# Patient Record
Sex: Male | Born: 1965 | Hispanic: Yes | Smoking: Former smoker
Health system: Southern US, Community
[De-identification: ages and names within clinical notes are randomized; demographics above are authoritative.]

## PROBLEM LIST (undated history)

## (undated) DIAGNOSIS — G459 Transient cerebral ischemic attack, unspecified: Secondary | ICD-10-CM

## (undated) DIAGNOSIS — N189 Chronic kidney disease, unspecified: Secondary | ICD-10-CM

## (undated) DIAGNOSIS — I10 Essential (primary) hypertension: Secondary | ICD-10-CM

## (undated) HISTORY — DX: Chronic kidney disease, unspecified: N18.9

## (undated) HISTORY — PX: OTHER SURGICAL HISTORY: SHX169

## (undated) HISTORY — PX: KIDNEY TRANSPLANT: SHX239

---

## 2016-08-03 ENCOUNTER — Inpatient Hospital Stay (HOSPITAL_COMMUNITY)
Admission: EM | Admit: 2016-08-03 | Discharge: 2016-08-06 | DRG: 069 | Disposition: A | Discharge status: Home / Self Care | Payer: Self-pay | Attending: Internal Medicine | Admitting: Internal Medicine

## 2016-08-03 ENCOUNTER — Observation Stay (HOSPITAL_COMMUNITY): Payer: Self-pay

## 2016-08-03 ENCOUNTER — Emergency Department (HOSPITAL_COMMUNITY): Payer: Self-pay

## 2016-08-03 ENCOUNTER — Encounter (HOSPITAL_COMMUNITY): Payer: Self-pay | Admitting: Emergency Medicine

## 2016-08-03 DIAGNOSIS — N19 Unspecified kidney failure: Secondary | ICD-10-CM

## 2016-08-03 DIAGNOSIS — L989 Disorder of the skin and subcutaneous tissue, unspecified: Secondary | ICD-10-CM

## 2016-08-03 DIAGNOSIS — R4781 Slurred speech: Secondary | ICD-10-CM | POA: Diagnosis present

## 2016-08-03 DIAGNOSIS — Z91148 Patient's other noncompliance with medication regimen for other reason: Secondary | ICD-10-CM

## 2016-08-03 DIAGNOSIS — N179 Acute kidney failure, unspecified: Secondary | ICD-10-CM | POA: Diagnosis present

## 2016-08-03 DIAGNOSIS — N189 Chronic kidney disease, unspecified: Secondary | ICD-10-CM | POA: Diagnosis present

## 2016-08-03 DIAGNOSIS — N183 Chronic kidney disease, stage 3 (moderate): Secondary | ICD-10-CM

## 2016-08-03 DIAGNOSIS — R000 Tachycardia, unspecified: Secondary | ICD-10-CM | POA: Diagnosis present

## 2016-08-03 DIAGNOSIS — I129 Hypertensive chronic kidney disease with stage 1 through stage 4 chronic kidney disease, or unspecified chronic kidney disease: Secondary | ICD-10-CM | POA: Diagnosis present

## 2016-08-03 DIAGNOSIS — I444 Left anterior fascicular block: Secondary | ICD-10-CM | POA: Diagnosis present

## 2016-08-03 DIAGNOSIS — R7989 Other specified abnormal findings of blood chemistry: Secondary | ICD-10-CM | POA: Insufficient documentation

## 2016-08-03 DIAGNOSIS — I4581 Long QT syndrome: Secondary | ICD-10-CM | POA: Diagnosis present

## 2016-08-03 DIAGNOSIS — I10 Essential (primary) hypertension: Secondary | ICD-10-CM | POA: Diagnosis present

## 2016-08-03 DIAGNOSIS — E876 Hypokalemia: Secondary | ICD-10-CM | POA: Diagnosis present

## 2016-08-03 DIAGNOSIS — Z87891 Personal history of nicotine dependence: Secondary | ICD-10-CM

## 2016-08-03 DIAGNOSIS — R778 Other specified abnormalities of plasma proteins: Secondary | ICD-10-CM

## 2016-08-03 DIAGNOSIS — I248 Other forms of acute ischemic heart disease: Secondary | ICD-10-CM | POA: Diagnosis present

## 2016-08-03 DIAGNOSIS — I169 Hypertensive crisis, unspecified: Secondary | ICD-10-CM | POA: Diagnosis present

## 2016-08-03 DIAGNOSIS — Z8673 Personal history of transient ischemic attack (TIA), and cerebral infarction without residual deficits: Secondary | ICD-10-CM

## 2016-08-03 DIAGNOSIS — Z9114 Patient's other noncompliance with medication regimen: Secondary | ICD-10-CM

## 2016-08-03 DIAGNOSIS — G459 Transient cerebral ischemic attack, unspecified: Principal | ICD-10-CM

## 2016-08-03 HISTORY — DX: Essential (primary) hypertension: I10

## 2016-08-03 HISTORY — DX: Transient cerebral ischemic attack, unspecified: G45.9

## 2016-08-03 LAB — RAPID URINE DRUG SCREEN, HOSP PERFORMED
Amphetamines: NOT DETECTED
BARBITURATES: NOT DETECTED
Benzodiazepines: NOT DETECTED
COCAINE: NOT DETECTED
OPIATES: POSITIVE — AB
TETRAHYDROCANNABINOL: NOT DETECTED

## 2016-08-03 LAB — CBC
HEMATOCRIT: 57.1 % — AB (ref 39.0–52.0)
HEMOGLOBIN: 18.7 g/dL — AB (ref 13.0–17.0)
MCH: 25.3 pg — AB (ref 26.0–34.0)
MCHC: 32.7 g/dL (ref 30.0–36.0)
MCV: 77.2 fL — ABNORMAL LOW (ref 78.0–100.0)
Platelets: 376 10*3/uL (ref 150–400)
RBC: 7.4 MIL/uL — AB (ref 4.22–5.81)
RDW: 15.1 % (ref 11.5–15.5)
WBC: 8.7 10*3/uL (ref 4.0–10.5)

## 2016-08-03 LAB — URINE MICROSCOPIC-ADD ON

## 2016-08-03 LAB — COMPREHENSIVE METABOLIC PANEL
ALBUMIN: 4 g/dL (ref 3.5–5.0)
ALK PHOS: 70 U/L (ref 38–126)
ALT: 23 U/L (ref 17–63)
AST: 20 U/L (ref 15–41)
Anion gap: 10 (ref 5–15)
BILIRUBIN TOTAL: 0.7 mg/dL (ref 0.3–1.2)
BUN: 30 mg/dL — ABNORMAL HIGH (ref 6–20)
CALCIUM: 8.8 mg/dL — AB (ref 8.9–10.3)
CO2: 29 mmol/L (ref 22–32)
CREATININE: 2.75 mg/dL — AB (ref 0.61–1.24)
Chloride: 100 mmol/L — ABNORMAL LOW (ref 101–111)
GFR calc Af Amer: 29 mL/min — ABNORMAL LOW (ref >60)
GFR calc non Af Amer: 25 mL/min — ABNORMAL LOW (ref >60)
GLUCOSE: 149 mg/dL — AB (ref 65–99)
Potassium: 3.3 mmol/L — ABNORMAL LOW (ref 3.5–5.1)
SODIUM: 139 mmol/L (ref 135–145)
TOTAL PROTEIN: 8.3 g/dL — AB (ref 6.5–8.1)

## 2016-08-03 LAB — URINALYSIS, ROUTINE W REFLEX MICROSCOPIC
Bilirubin Urine: NEGATIVE
GLUCOSE, UA: 100 mg/dL — AB
Ketones, ur: NEGATIVE mg/dL
Leukocytes, UA: NEGATIVE
Nitrite: NEGATIVE
PH: 7 (ref 5.0–8.0)
SPECIFIC GRAVITY, URINE: 1.016 (ref 1.005–1.030)

## 2016-08-03 LAB — PROTIME-INR
INR: 1
Prothrombin Time: 13.2 seconds (ref 11.4–15.2)

## 2016-08-03 LAB — TROPONIN I
TROPONIN I: 0.04 ng/mL — AB (ref <0.03)
Troponin I: 0.04 ng/mL (ref <0.03)

## 2016-08-03 LAB — TSH: TSH: 2.13 u[IU]/mL (ref 0.350–4.500)

## 2016-08-03 MED ORDER — HYDRALAZINE HCL 20 MG/ML IJ SOLN
10.0000 mg | INTRAMUSCULAR | Status: DC | PRN
  Administered 2016-08-03 – 2016-08-04 (×2): 10 mg via INTRAVENOUS
  Filled 2016-08-03 (×2): qty 1

## 2016-08-03 MED ORDER — ONDANSETRON HCL 4 MG/2ML IJ SOLN
4.0000 mg | Freq: Once | INTRAMUSCULAR | Status: AC
  Administered 2016-08-03: 4 mg via INTRAVENOUS
  Filled 2016-08-03: qty 2

## 2016-08-03 MED ORDER — LABETALOL HCL 5 MG/ML IV SOLN
20.0000 mg | Freq: Once | INTRAVENOUS | Status: AC
  Administered 2016-08-03: 20 mg via INTRAVENOUS
  Filled 2016-08-03: qty 4

## 2016-08-03 MED ORDER — STROKE: EARLY STAGES OF RECOVERY BOOK
Freq: Once | Status: AC
  Administered 2016-08-03: 16:00:00
  Filled 2016-08-03: qty 1

## 2016-08-03 MED ORDER — PNEUMOCOCCAL VAC POLYVALENT 25 MCG/0.5ML IJ INJ
0.5000 mL | INJECTION | INTRAMUSCULAR | Status: AC
  Administered 2016-08-04: 0.5 mL via INTRAMUSCULAR
  Filled 2016-08-03 (×2): qty 0.5

## 2016-08-03 MED ORDER — AMLODIPINE BESYLATE 10 MG PO TABS
10.0000 mg | ORAL_TABLET | Freq: Every day | ORAL | Status: DC
  Administered 2016-08-03 – 2016-08-06 (×4): 10 mg via ORAL
  Filled 2016-08-03 (×4): qty 1

## 2016-08-03 MED ORDER — MORPHINE SULFATE (PF) 4 MG/ML IV SOLN
4.0000 mg | Freq: Once | INTRAVENOUS | Status: AC
  Administered 2016-08-03: 4 mg via INTRAVENOUS
  Filled 2016-08-03: qty 1

## 2016-08-03 MED ORDER — ASPIRIN 300 MG RE SUPP
300.0000 mg | Freq: Every day | RECTAL | Status: DC
  Filled 2016-08-03 (×3): qty 1

## 2016-08-03 MED ORDER — ASPIRIN 325 MG PO TABS
325.0000 mg | ORAL_TABLET | Freq: Every day | ORAL | Status: DC
  Administered 2016-08-04 – 2016-08-06 (×3): 325 mg via ORAL
  Filled 2016-08-03 (×3): qty 1

## 2016-08-03 MED ORDER — HYDRALAZINE HCL 25 MG PO TABS
25.0000 mg | ORAL_TABLET | Freq: Three times a day (TID) | ORAL | Status: DC
  Administered 2016-08-03 – 2016-08-04 (×3): 25 mg via ORAL
  Filled 2016-08-03 (×3): qty 1

## 2016-08-03 MED ORDER — ASPIRIN 81 MG PO CHEW
325.0000 mg | CHEWABLE_TABLET | Freq: Once | ORAL | Status: AC
  Administered 2016-08-03: 325 mg via ORAL
  Filled 2016-08-03: qty 5

## 2016-08-03 MED ORDER — HYDRALAZINE HCL 20 MG/ML IJ SOLN
20.0000 mg | Freq: Once | INTRAMUSCULAR | Status: AC
  Administered 2016-08-03: 20 mg via INTRAVENOUS
  Filled 2016-08-03: qty 1

## 2016-08-03 MED ORDER — SODIUM CHLORIDE 0.9 % IV SOLN
INTRAVENOUS | Status: DC
  Administered 2016-08-03 – 2016-08-04 (×2): via INTRAVENOUS

## 2016-08-03 MED ORDER — SODIUM CHLORIDE 0.9 % IV BOLUS (SEPSIS)
1000.0000 mL | Freq: Once | INTRAVENOUS | Status: AC
  Administered 2016-08-03: 1000 mL via INTRAVENOUS

## 2016-08-03 MED ORDER — ENOXAPARIN SODIUM 40 MG/0.4ML ~~LOC~~ SOLN
40.0000 mg | SUBCUTANEOUS | Status: DC
  Administered 2016-08-03 – 2016-08-05 (×3): 40 mg via SUBCUTANEOUS
  Filled 2016-08-03 (×3): qty 0.4

## 2016-08-03 NOTE — ED Notes (Signed)
MD at bedside. ADMITTING MD PRESENT 

## 2016-08-03 NOTE — H&P (Signed)
History and Physical    Fred Harrison TDD:[id number] DOB: [birth year] DOA: 08/03/2016  PCP: No PCP Per Patient  Outpatient Specialists: none Patient coming from: home  Chief Complaint: left-sided headache and right arm numbness  HPI: Fred Harrison is a 50 y.o. male with medical history significant of HTN (currently not on medication), ?CKD(used to see nephrologist in [place]) and CVA (about 7 years ago).   Patient reports intermittent headache for a week and half that he has been managing with as needed Tylenol. He got off work this morning about 5am and slept. About 7:30 AM, he woke up with left sided headache and right sided numbness and brought to ED by his son-in-law. He also reports having pain in right eye yesterday that has resolved. Patient denies facial drooping, change in speech, vision changes, arm or leg weakness, chest pain or shortness of breath. He initially stated that numbness has resolved but when tried to retrieve his daughter's phone number, he flet his hand is numb. Daughter stated that his speech was slightly slurred when he woke up this morning. She also stated that he has history of stroke in the past.  He has history of hypertension and hasn't taken any medications since he moved from [place] to [place] about a year and half ago. He reports following up with nephrologist when he was in [place] for his kidney. However, he doesn't know what the exact problem was. He denies history of diabetes or heart problem.   Quit smoking 07/01/2016. Has history of 2-pack-year smoking although his daughter stated that he was smoking up to 2 packs a day in the past. Denies EtOH or recreational drug use.   Lives with daughter and son-in-law since he moved down from [place] to [place]. Daughter's phone number is [phone]. Daughter's name Fred Harrison.   Family history significant for death from heart attack in his father at age of 36 years. Days from CVA at 70 years of  age in his mother. He has one brother and three sisters with no cardiovascular heart condition to his knowledge.   ED Course: Vital signs significant for blood pressure 251/159 and heart rate 107 on arrival to ED. CMP significant for hypokalemia to 3.3, creatinine to 2.75 otherwise within normal limits. CBC significant for hemoglobin of 18.7 and hematocrit of 57.1. Troponin 0.04. EKG with sinus tachycardia, biatrial enlargement, left axis deviation and left anterior fascicular block and QT prolongation. CT head without acute intracranial process. X-ray likely with basilar atelectasis/ scarring with no evidence of lobar pneumonia. Received aspirin 325 mg, labetalol 20 mg IV, hydralazine 20 mg IV, morphine 4 mg, Zofran 4 mg and normal saline 1 L bolus. Hospitalist service was called to admit patient for further workup and observation. Neurology was not called while in ED because patient reported that his symptoms have resolved. However, patient states having some numbness in right arm when he tried to retrieve his daughter's number from his phone.   Review of Systems: As per HPI otherwise 10 point review of systems negative.   Past Medical History:  Diagnosis Date  . Hypertension   . TIA (transient ischemic attack)     History reviewed. No pertinent surgical history.   reports that he has quit smoking. He has never used smokeless tobacco. He reports that he does not drink alcohol. His drug history is not on file.  No Known Allergies  No family history on file.  Prior to Admission medications   Not on  File    Physical Exam: Vitals:   08/03/16 0945 08/03/16 1015 08/03/16 1100 08/03/16 1140  BP: (!) 161/111 (!) 163/112 (!) 175/116 (!) 177/120  Pulse: 91 96 101 90  Resp: 13 16 19 15   Temp:    97.8 F (36.6 C)  TempSrc:    Oral  SpO2: 97% 96% 95% 96%  Weight:      Height:          Constitutional: NAD, calm, comfortable Vitals:   08/03/16 0945 08/03/16 1015 08/03/16 1100 08/03/16  1140  BP: (!) 161/111 (!) 163/112 (!) 175/116 (!) 177/120  Pulse: 91 96 101 90  Resp: 13 16 19 15   Temp:    97.8 F (36.6 C)  TempSrc:    Oral  SpO2: 97% 96% 95% 96%  Weight:      Height:       Eyes: Pupils equal round, mildly reactive to light bilaterally, lids and conjunctivae normal ENMT: Mucous membranes are moist. Posterior pharynx clear of any exudate or lesions. Neck: normal, supple, no masses, no thyromegaly Respiratory: clear to auscultation bilaterally, no wheezing, no crackles. Normal respiratory effort. No accessory muscle use.  Cardiovascular: Regular rate and rhythm, no murmurs / rubs / gallops. No extremity edema. 2+ pedal pulses. No carotid bruits Abdomen: no tenderness, no masses palpated. Bowel sounds positive.  Musculoskeletal: no clubbing / cyanosis. Normal muscle tone.  Skin: no rashes, lesions, ulcers. Has cystic mass about 1 inch in diameter on top of his frontal head.  Neurologic: Speech is mildly slurred, CN 2-12 intact. Strength 5/5 in all muscle groups of upper and lower extremities, light sensation intact in all dermatomes, biceps and patellar reflex 2+ bilaterally, finger-to-nose slow in right compared to left. No pronator drift.  Psychiatric: Normal judgment and insight. Alert and oriented x 3. Normal mood.   Labs on Admission: I have personally reviewed following labs and imaging studies  CBC:  Recent Labs Lab 08/03/16 0840  WBC 8.7  HGB 18.7*  HCT 57.1*  MCV 77.2*  PLT 465   Basic Metabolic Panel:  Recent Labs Lab 08/03/16 0840  NA 139  K 3.3*  CL 100*  CO2 29  GLUCOSE 149*  BUN 30*  CREATININE 2.75*  CALCIUM 8.8*   GFR: Estimated Creatinine Clearance: 33.7 mL/min (by C-G formula based on SCr of 2.75 mg/dL). Liver Function Tests:  Recent Labs Lab 08/03/16 0840  AST 20  ALT 23  ALKPHOS 70  BILITOT 0.7  PROT 8.3*  ALBUMIN 4.0   No results for input(s): LIPASE, AMYLASE in the last 168 hours. No results for input(s): AMMONIA  in the last 168 hours. Coagulation Profile:  Recent Labs Lab 08/03/16 0840  INR 1.00   Cardiac Enzymes:  Recent Labs Lab 08/03/16 0840  TROPONINI 0.04*   BNP (last 3 results) No results for input(s): PROBNP in the last 8760 hours. HbA1C: No results for input(s): HGBA1C in the last 72 hours. CBG: No results for input(s): GLUCAP in the last 168 hours. Lipid Profile: No results for input(s): CHOL, HDL, LDLCALC, TRIG, CHOLHDL, LDLDIRECT in the last 72 hours. Thyroid Function Tests: No results for input(s): TSH, T4TOTAL, FREET4, T3FREE, THYROIDAB in the last 72 hours. Anemia Panel: No results for input(s): VITAMINB12, FOLATE, FERRITIN, TIBC, IRON, RETICCTPCT in the last 72 hours. Urine analysis: No results found for: COLORURINE, APPEARANCEUR, LABSPEC, PHURINE, GLUCOSEU, HGBUR, BILIRUBINUR, KETONESUR, PROTEINUR, UROBILINOGEN, NITRITE, LEUKOCYTESUR Sepsis Labs: @LABRCNTIP (procalcitonin:4,lacticidven:4) )No results found for this or any previous visit (from the past 240  hour(s)).   Radiological Exams on Admission: Dg Chest 2 View  Result Date: 08/03/2016 CLINICAL DATA:  50 year old male with a history of right arm numbness EXAM: CHEST  2 VIEW COMPARISON:  None. FINDINGS: Cardiomediastinal silhouette within normal limits. No evidence of central vascular congestion. No pneumothorax or pleural effusion. Interstitial opacities at the lung bases. No confluent airspace disease. No displaced fracture. IMPRESSION: Likely basilar atelectasis/ scarring with no evidence of lobar pneumonia. Signed, Dulcy Fanny. Earleen Newport, DO [facility] Radiology Electronically Signed   By: Corrie Mckusick D.O.   On: 08/03/2016 09:28   Ct Head Wo Contrast  Result Date: 08/03/2016 CLINICAL DATA:  50 year old male with a history of right arm numbness EXAM: CT HEAD WITHOUT CONTRAST TECHNIQUE: Contiguous axial images were obtained from the base of the skull through the vertex  without intravenous contrast. COMPARISON:  None. FINDINGS: Brain: No acute intracranial hemorrhage. No midline shift or mass effect. Gray-white differentiation maintained. Unremarkable appearance of the ventricular system. Confluent hypodensity in the periventricular white matter, within frontal and parietal regions. Vascular: Minimal calcifications of the anterior vasculature. Skull: No acute fracture.  No aggressive bone lesion identified. Sinuses/Orbits: Unremarkable appearance of the orbits. Mastoid air cells clear. No middle ear effusion. No significant sinus disease. Other: Mixed density benign-appearing rounded soft tissue lesions within the right height temporal region measuring 1.5 cm and in the high left frontal region at the vertex measuring 3.3 cm. No involvement of the underlying calvarium. IMPRESSION: No CT evidence of acute intracranial abnormality. Evidence of microvascular ischemic disease. Mixed density dermal lesions in the right temporal soft tissues and left high frontal region at the vertex. These lesion should be amenable to direct inspection. Signed, Dulcy Fanny. Earleen Newport, DO [facility] Radiology Electronically Signed   By: Corrie Mckusick D.O.   On: 08/03/2016 09:21    EKG: Independently reviewed.   Assessment/Plan Active Problems:   TIA (transient ischemic attack)  Left-sided headache/right arm numbness: CT head negative for intracranial bleeding. Patient was in hypertensive crisis. Neuro exam is within normal limits except for his slow finger-to-nose in the right arm compared to left. Speech is also slightly slurred. Initially patient reported to ED provider that his symptoms have resolved. However, when he tried to retrieve his daughters phone number from his cell, he felt he still have some numbness in his right arm.  -Admit to telemetry -TIA/Stroke work up: -MRI brian -Echo  -Carotid  doppler -Risk stratification labs (lipid panel, A1c and TSH) -UDS -We will consult neurology if there is significant MRI finding -Neurovascular check q2hrs -Cardiac monitor -ASA. Received once in ED -PT/OT/SLP -Atorvastatin 80 mg daily once he passed bedside swallow test -NPO pending SLP eval -Normal saline at 100 mL per hour -Fall precaution -Permissive hypertension  Accelerated hypertension: BP in 250s/130s on arrival. Status post hydralazine IV 20 mg and labetalol IV 20 mg in ED. BP 177/120. -Permissive hypertension -labetalol IV 20 mg for systolic blood pressure over 595 and diastolic blood pressure over 110 -We will decide about long term blood pressure control after TIA/stroke workup  Elevated troponin: this likely due to his hypertension. He this could also be from his CKD. No chest pain or shortness of breath  -Repeat trop  AoCKD: Cr 2.75 on admission. Baseline unknown.  Used to see nephrologist in [place] before he moved to [facility]. S/p 1L of NS -NS at 100cc/hr -repeat BMP in the morning.    DVT prophylaxis: Lovenox Code Status:  Partial. No intubation Family Communication:  Daughter Antone Summons). [phone]. Disposition Plan: Likely home Consults called: No Admission status: Telemetry   Wendee Beavers, MD FM resident Pager [phone]  If 7PM-7AM, please contact night-coverage [email or web address] Password [email or web address]  08/03/2016, 11:46 AM

## 2016-08-03 NOTE — ED Notes (Signed)
EKKG PERFORMED. EDP AT BEDSIDE

## 2016-08-03 NOTE — ED Provider Notes (Signed)
[facility] DEPT Provider Note   CSN: [id number] Arrival date & time: 08/03/16  6503     History   Chief Complaint Chief Complaint  Patient presents with  . right arm numbness    HPI Fred Harrison is a 50 y.o. male.  Pt has been having intermittent headaches for the past few months.  Pt worked nights last night and got off around 0500.  He had a little headache last night, but it was not too bad.  He went to sleep and felt ok.  He said that he woke up around 0720 with a severe left sided h/a and right arm numbness.  The pt does have a hx of htn, but has not taken his meds in over a year.  He has also had a TIA in the past, but does not take daily ASA.  The pt said the numbness has improved, but he still has the headache.  Pt also notes that he has a growth to the top of his head that has been worsening over the past year.  He has never seen a doctor for this problem.      Past Medical History:  Diagnosis Date  . Hypertension   . TIA (transient ischemic attack)     Patient Active Problem List   Diagnosis Date Noted  . TIA (transient ischemic attack) 08/03/2016    History reviewed. No pertinent surgical history.     Home Medications    Prior to Admission medications   Not on File    Family History No family history on file.  Social History Social History  Substance Use Topics  . Smoking status: Former Research scientist (life sciences)  . Smokeless tobacco: Never Used  . Alcohol use No     Allergies   Review of patient's allergies indicates no known allergies.   Review of Systems Review of Systems  HENT:       Head lesion  Neurological: Positive for numbness and headaches.  All other systems reviewed and are negative.    Physical Exam Updated Vital Signs BP (!) 189/134   Pulse 89   Temp 98 F (36.7 C) (Oral)   Resp 15   Ht 5\' 7"  (1.702 m)   Wt 200 lb (90.7 kg)   SpO2 95%   BMI 31.32 kg/m   Physical Exam  Constitutional: He is oriented to person, place, and  time. He appears well-developed and well-nourished.  HENT:  Head: Atraumatic.    Right Ear: External ear normal.  Left Ear: External ear normal.  Nose: Nose normal.  Mouth/Throat: Oropharynx is clear and moist.  Eyes: EOM are normal. Pupils are equal, round, and reactive to light.  Neck: Normal range of motion. Neck supple.  Cardiovascular: Normal rate, regular rhythm, normal heart sounds and intact distal pulses.   Pulmonary/Chest: Effort normal and breath sounds normal.  Abdominal: Soft. Bowel sounds are normal.  Musculoskeletal: Normal range of motion.  Neurological: He is alert and oriented to person, place, and time.  Skin: Skin is warm and dry.  Psychiatric: He has a normal mood and affect. His behavior is normal. Judgment and thought content normal.  Nursing note and vitals reviewed.    ED Treatments / Results  Labs (all labs ordered are listed, but only abnormal results are displayed) Labs Reviewed  CBC - Abnormal; Notable for the following:       Result Value   RBC 7.40 (*)    Hemoglobin 18.7 (*)    HCT 57.1 (*)  MCV 77.2 (*)    MCH 25.3 (*)    All other components within normal limits  COMPREHENSIVE METABOLIC PANEL - Abnormal; Notable for the following:    Potassium 3.3 (*)    Chloride 100 (*)    Glucose, Bld 149 (*)    BUN 30 (*)    Creatinine, Ser 2.75 (*)    Calcium 8.8 (*)    Total Protein 8.3 (*)    GFR calc non Af Amer 25 (*)    GFR calc Af Amer 29 (*)    All other components within normal limits  TROPONIN I - Abnormal; Notable for the following:    Troponin I 0.04 (*)    All other components within normal limits  URINE RAPID DRUG SCREEN, HOSP PERFORMED  PROTIME-INR  URINALYSIS, ROUTINE W REFLEX MICROSCOPIC (NOT AT [facility])    EKG  EKG Interpretation  Date/Time:  Sunday August 03 2016 08:28:19 EDT Ventricular Rate:  105 PR Interval:    QRS Duration: 106 QT Interval:  384 QTC Calculation: 508 R Axis:   -55 Text Interpretation:  Sinus  tachycardia Biatrial enlargement Left anterior fascicular block Abnormal R-wave progression, early transition Left ventricular hypertrophy ST elevation, consider inferior injury Prolonged QT interval Baseline wander in lead(s) III aVL aVF V1 V3 Confirmed by Mattheo Swindle MD, Enes Wegener ([id number]) on 08/03/2016 8:43:10 AM Also confirmed by Gilford Raid MD, Kamar Callender [phone]), editor Stout CT, Leda Gauze [phone])  on 08/03/2016 9:14:15 AM       Radiology Dg Chest 2 View  Result Date: 08/03/2016 CLINICAL DATA:  50 year old male with a history of right arm numbness EXAM: CHEST  2 VIEW COMPARISON:  None. FINDINGS: Cardiomediastinal silhouette within normal limits. No evidence of central vascular congestion. No pneumothorax or pleural effusion. Interstitial opacities at the lung bases. No confluent airspace disease. No displaced fracture. IMPRESSION: Likely basilar atelectasis/ scarring with no evidence of lobar pneumonia. Signed, Dulcy Fanny. Earleen Newport, DO [facility] Radiology Electronically Signed   By: Corrie Mckusick D.O.   On: 08/03/2016 09:28   Ct Head Wo Contrast  Result Date: 08/03/2016 CLINICAL DATA:  50 year old male with a history of right arm numbness EXAM: CT HEAD WITHOUT CONTRAST TECHNIQUE: Contiguous axial images were obtained from the base of the skull through the vertex without intravenous contrast. COMPARISON:  None. FINDINGS: Brain: No acute intracranial hemorrhage. No midline shift or mass effect. Gray-white differentiation maintained. Unremarkable appearance of the ventricular system. Confluent hypodensity in the periventricular white matter, within frontal and parietal regions. Vascular: Minimal calcifications of the anterior vasculature. Skull: No acute fracture.  No aggressive bone lesion identified. Sinuses/Orbits: Unremarkable appearance of the orbits. Mastoid air cells clear. No middle ear effusion. No significant sinus disease. Other: Mixed density benign-appearing  rounded soft tissue lesions within the right height temporal region measuring 1.5 cm and in the high left frontal region at the vertex measuring 3.3 cm. No involvement of the underlying calvarium. IMPRESSION: No CT evidence of acute intracranial abnormality. Evidence of microvascular ischemic disease. Mixed density dermal lesions in the right temporal soft tissues and left high frontal region at the vertex. These lesion should be amenable to direct inspection. Signed, Dulcy Fanny. Earleen Newport, DO [facility] Radiology Electronically Signed   By: Corrie Mckusick D.O.   On: 08/03/2016 09:21    Procedures Procedures (including critical care time)  Medications Ordered in ED Medications  aspirin chewable tablet 325 mg (not administered)  sodium chloride 0.9 % bolus 1,000 mL (not  administered)  labetalol (NORMODYNE,TRANDATE) injection 20 mg (20 mg Intravenous Given 08/03/16 0900)  morphine 4 MG/ML injection 4 mg (4 mg Intravenous Given 08/03/16 0843)  ondansetron (ZOFRAN) injection 4 mg (4 mg Intravenous Given 08/03/16 0840)  hydrALAZINE (APRESOLINE) injection 20 mg (20 mg Intravenous Given 08/03/16 0934)     Initial Impression / Assessment and Plan / ED Course  I have reviewed the triage vital signs and the nursing notes.  Pertinent labs & imaging results that were available during my care of the patient were reviewed by me and considered in my medical decision making (see chart for details).  Clinical Course    Pt's bp remains high after 20 mg of labetalol and 20 mg of hydralazine.  He also has aki with elevated troponin.  The troponin is likely due to the renal insufficiency, but will need trending.  The pt was d/w Dr. Cruzita Lederer (triad) who will admit.  Final Clinical Impressions(s) / ED Diagnoses   Final diagnoses:  Transient cerebral ischemia, unspecified transient cerebral ischemia type  Malignant hypertension  AKI (acute kidney injury) ([place])  Elevated  troponin  Skin lesion of scalp    New Prescriptions New Prescriptions   No medications on file     Isla Pence, MD 08/03/16 1031

## 2016-08-03 NOTE — ED Notes (Signed)
BRENT /MRI IN ROUTE- ETA 1 HOUR

## 2016-08-03 NOTE — ED Notes (Signed)
Attempted blood draw x 2 for recollect of blue tube without success

## 2016-08-03 NOTE — ED Notes (Signed)
Charge Manuela Schwartz RN made aware of delay in transfer. [facility] xray called [facility] [phone] MRI - aware of order- MRI order placed for tomorrow - NOT TODAY.Marland Kitchen [facility] F[facility] will call admitting MD to change order.

## 2016-08-03 NOTE — ED Notes (Signed)
DELAY ON LABATETOL. TRANSFER TO CT

## 2016-08-03 NOTE — ED Notes (Signed)
MD at bedside. 

## 2016-08-03 NOTE — ED Triage Notes (Signed)
Patient c/o right arm numbness that started about 30 mins ago when woke up from sleeping.  Patient has left side headache this morning which woke him up due to pain.  Patient states that about 3 years ago had TIA. Patient also states that knot on top of his head has gotten bigger in the last year.

## 2016-08-03 NOTE — ED Notes (Signed)
Pt still unable to give urine sample

## 2016-08-03 NOTE — ED Notes (Addendum)
MD at bedside. Explaining results of scan and pt current status

## 2016-08-04 ENCOUNTER — Observation Stay (HOSPITAL_BASED_OUTPATIENT_CLINIC_OR_DEPARTMENT_OTHER): Payer: Self-pay

## 2016-08-04 DIAGNOSIS — I10 Essential (primary) hypertension: Secondary | ICD-10-CM

## 2016-08-04 DIAGNOSIS — R200 Anesthesia of skin: Secondary | ICD-10-CM

## 2016-08-04 DIAGNOSIS — R7989 Other specified abnormal findings of blood chemistry: Secondary | ICD-10-CM

## 2016-08-04 DIAGNOSIS — N179 Acute kidney failure, unspecified: Secondary | ICD-10-CM

## 2016-08-04 DIAGNOSIS — G459 Transient cerebral ischemic attack, unspecified: Secondary | ICD-10-CM

## 2016-08-04 DIAGNOSIS — I6523 Occlusion and stenosis of bilateral carotid arteries: Secondary | ICD-10-CM

## 2016-08-04 LAB — VAS US CAROTID
LCCADSYS: -78 cm/s
LCCAPDIAS: 18 cm/s
LEFT ECA DIAS: -10 cm/s
LEFT VERTEBRAL DIAS: 16 cm/s
LICADDIAS: -17 cm/s
LICAPDIAS: -20 cm/s
LICAPSYS: -58 cm/s
Left CCA dist dias: -17 cm/s
Left CCA prox sys: 93 cm/s
Left ICA dist sys: -43 cm/s
RCCAPDIAS: 14 cm/s
RCCAPSYS: 64 cm/s
RIGHT ECA DIAS: -11 cm/s
RIGHT VERTEBRAL DIAS: 8 cm/s
Right cca dist sys: -46 cm/s

## 2016-08-04 LAB — BASIC METABOLIC PANEL
ANION GAP: 11 (ref 5–15)
ANION GAP: 10 (ref 5–15)
BUN: 24 mg/dL — ABNORMAL HIGH (ref 6–20)
BUN: 23 mg/dL — ABNORMAL HIGH (ref 6–20)
CHLORIDE: 100 mmol/L — AB (ref 101–111)
CHLORIDE: 100 mmol/L — AB (ref 101–111)
CO2: 27 mmol/L (ref 22–32)
CO2: 28 mmol/L (ref 22–32)
CREATININE: 2.47 mg/dL — AB (ref 0.61–1.24)
Calcium: 8.6 mg/dL — ABNORMAL LOW (ref 8.9–10.3)
Calcium: 8.5 mg/dL — ABNORMAL LOW (ref 8.9–10.3)
Creatinine, Ser: 2.48 mg/dL — ABNORMAL HIGH (ref 0.61–1.24)
GFR calc non Af Amer: 28 mL/min — ABNORMAL LOW (ref >60)
GFR calc non Af Amer: 28 mL/min — ABNORMAL LOW (ref >60)
GFR, EST AFRICAN AMERICAN: 33 mL/min — AB (ref >60)
GFR, EST AFRICAN AMERICAN: 33 mL/min — AB (ref >60)
Glucose, Bld: 133 mg/dL — ABNORMAL HIGH (ref 65–99)
Glucose, Bld: 123 mg/dL — ABNORMAL HIGH (ref 65–99)
POTASSIUM: 3 mmol/L — AB (ref 3.5–5.1)
POTASSIUM: 3.5 mmol/L (ref 3.5–5.1)
SODIUM: 138 mmol/L (ref 135–145)
SODIUM: 138 mmol/L (ref 135–145)

## 2016-08-04 LAB — LIPID PANEL
CHOL/HDL RATIO: 4.6 ratio
CHOLESTEROL: 161 mg/dL (ref 0–200)
HDL: 35 mg/dL — ABNORMAL LOW (ref >40)
LDL Cholesterol: 92 mg/dL (ref 0–99)
Triglycerides: 172 mg/dL — ABNORMAL HIGH (ref <150)
VLDL: 34 mg/dL (ref 0–40)

## 2016-08-04 LAB — ECHOCARDIOGRAM COMPLETE
HEIGHTINCHES: 67 in
Weight: 3200 oz

## 2016-08-04 MED ORDER — ACETAMINOPHEN 325 MG PO TABS
650.0000 mg | ORAL_TABLET | Freq: Four times a day (QID) | ORAL | Status: DC | PRN
  Administered 2016-08-04 – 2016-08-05 (×2): 650 mg via ORAL
  Filled 2016-08-04 (×2): qty 2

## 2016-08-04 MED ORDER — METOPROLOL TARTRATE 50 MG PO TABS
50.0000 mg | ORAL_TABLET | Freq: Two times a day (BID) | ORAL | Status: DC
  Administered 2016-08-04 – 2016-08-05 (×3): 50 mg via ORAL
  Filled 2016-08-04 (×3): qty 1

## 2016-08-04 NOTE — Evaluation (Signed)
Physical Therapy One Time Evaluation Patient Details Name: Fred Harrison MRN: [id number] DOB: 03-26-1966 Today's Date: 08/04/2016   History of Present Illness  50 -year-old male with past medical history significant for hypertension, chronic kidney disease, TIA about 7 years ago and admitted for TIA, headache, and right sided numbness.  MRI negative for acute findings  Clinical Impression  Patient evaluated by Physical Therapy with no further acute PT needs identified. All education has been completed and the patient has no further questions.  Pt mobilizing well and denies any symptoms at time of evaluation. See below for any follow-up Physical Therapy or equipment needs. PT is signing off. Thank you for this referral.     Follow Up Recommendations No PT follow up    Equipment Recommendations  None recommended by PT    Recommendations for Other Services       Precautions / Restrictions Precautions Precautions: None      Mobility  Bed Mobility               General bed mobility comments: pt up in recliner on arrival  Transfers Overall transfer level: Needs assistance   Transfers: Sit to/from Stand Sit to Stand: Supervision         General transfer comment: supervision only due to BP  Ambulation/Gait Ambulation/Gait assistance: Supervision Ambulation Distance (Feet): 400 Feet Assistive device: None Gait Pattern/deviations: WFL(Within Functional Limits)     General Gait Details: no unsteadiness, denies symptoms  Stairs            Wheelchair Mobility    Modified Rankin (Stroke Patients Only) Modified Rankin (Stroke Patients Only) Pre-Morbid Rankin Score: No symptoms Modified Rankin: No symptoms     Balance Overall balance assessment: No apparent balance deficits (not formally assessed) (denies feeling unsteady)                                           Pertinent Vitals/Pain Pain Assessment: No/denies pain (headache this  morning but none currently)  Pre-activity BP: 150/104 mmHg, 75 bpm (RN states this is lower BP for him, has been running very high)    Home Living Family/patient expects to be discharged to:: Private residence Living Arrangements: Children Available Help at Discharge: Family         Home Layout: Two level Home Equipment: None      Prior Function Level of Independence: Independent         Comments: works in [employer] Hand: Right    Extremity/Trunk Assessment   Upper Extremity Assessment: Overall WFL for tasks assessed           Lower Extremity Assessment: Overall WFL for tasks assessed (pt denies any weakness, no symptoms)         Communication   Communication: No difficulties  Cognition Arousal/Alertness: Awake/alert Behavior During Therapy: WFL for tasks assessed/performed Overall Cognitive Status: Within Functional Limits for tasks assessed                      General Comments      Exercises        Assessment/Plan    PT Assessment Patent does not need any further PT services  PT Diagnosis Difficulty walking   PT Problem List    PT Treatment Interventions     PT Goals (Current goals can be  found in the Care Plan section) Acute Rehab PT Goals Patient Stated Goal: get back to work PT Goal Formulation: All assessment and education complete, DC therapy    Frequency     Barriers to discharge        Co-evaluation               End of Session Equipment Utilized During Treatment: Gait belt Activity Tolerance: Patient tolerated treatment well Patient left: in chair;with call bell/phone within reach Nurse Communication: Mobility status    Functional Assessment Tool Used: clinical judgement Functional Limitation: Mobility: Walking and moving around Mobility: Walking and Moving Around Current Status (Y1856): At least 1 percent but less than 20 percent impaired, limited or restricted Mobility:  Walking and Moving Around Goal Status [phone]): 0 percent impaired, limited or restricted Mobility: Walking and Moving Around Discharge Status [phone]): 0 percent impaired, limited or restricted    Time: 1200-1209 PT Time Calculation (min) (ACUTE ONLY): 9 min   Charges:   PT Evaluation $PT Eval Low Complexity: 1 Procedure     PT G Codes:   PT G-Codes **NOT FOR INPATIENT CLASS** Functional Assessment Tool Used: clinical judgement Functional Limitation: Mobility: Walking and moving around Mobility: Walking and Moving Around Current Status (C5885): At least 1 percent but less than 20 percent impaired, limited or restricted Mobility: Walking and Moving Around Goal Status [phone]): 0 percent impaired, limited or restricted Mobility: Walking and Moving Around Discharge Status [phone]): 0 percent impaired, limited or restricted    Lamesha Tibbits,KATHrine E 08/04/2016, 1:21 PM Carmelia Bake, PT, DPT 08/04/2016 Pager: [phone]

## 2016-08-04 NOTE — Progress Notes (Signed)
  Echocardiogram 2D Echocardiogram has been performed.  Bobbye Charleston 08/04/2016, 9:01 AM

## 2016-08-04 NOTE — Progress Notes (Addendum)
Patient ID: Fred Harrison, male   DOB: 03/12/1966, 50 y.o.   MRN: [id number]  PROGRESS NOTE    Fred Harrison  FUX:[id number] DOB: [birth year] DOA: 08/03/2016  PCP: No PCP Per Patient   Brief Narrative:  50 -year-old male with past medical history significant for hypertension, chronic kidney disease, TIA about 7 years ago. Patient is from [place] area and has moved to this area and has not been compliant with medications. He presented with headaches and numbness over the right side of the body. Patient did not have reports of weakness or facial droop or slurred speech or vision changes. 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 His creatinine on the admission was 2.7 and he does say he has had chronic kidney disease but not sure of his baseline creatinine. The troponin level was 0.04 on the admission and 12-lead EKG showed sinus tachycardia. Of note neurology was not consulted as patient reported improvement in his symptoms while in ED.  Assessment & Plan:   TIA / Headache / Right sided numbness  - TIA order set utilized - CT head and MRI without acute intracranial findings - Carotid Doppler did not show significant stenosis bilaterally - 2-D echo showed ejection fraction of 50% with grade 2 diastolic dysfunction - Patient has slight headache this morning but numbness has completely resolved - Continue daily aspirin  Acute kidney injury - Suspect chronic kidney disease however we have no other values for comparison - Creatinine has improved since the admission from 2.7 to 2.4 - Renal ultrasound does show chronic disease  Essential hypertension - Will change medications to Norvasc and metoprolol and will stop hydralazine  Elevated troponin - Likely demand ischemia from acute on chronic renal insufficiency - No reports of chest pain   DVT prophylaxis: Lovenox subcutaneous Code Status: full code  Family  Communication: No family at the bedside Disposition Plan: home liekly 9/12 if Cr stable    Consultants:   None   Procedures:   Carotid doppler - no significant stenosis   ECHO - EF 50-55% and grade 2 diastolic dysfunction   Antimicrobials:   None    Subjective: Feels better, has little headache this am.  Objective: Vitals:   08/03/16 2119 08/04/16 0020 08/04/16 0317 08/04/16 0515  BP: (!) 184/118 (!) 188/103 (!) 148/76 (!) 167/108  Pulse: (!) 115 91 (!) 111 (!) 109  Resp: 20   20  Temp: 98.7 F (37.1 C)   98.8 F (37.1 C)  TempSrc: Oral   Oral  SpO2: 97%   99%  Weight:      Height:        Intake/Output Summary (Last 24 hours) at 08/04/16 1142 Last data filed at 08/04/16 1000  Gross per 24 hour  Intake          2301.83 ml  Output             1300 ml  Net          1001.83 ml   Filed Weights   08/03/16 0824  Weight: 90.7 kg (200 lb)    Examination:  General exam: Appears calm and comfortable  Respiratory system: Clear to auscultation. Respiratory effort normal. Cardiovascular system: S1 & S2 heard, RRR. No JVD, No pedal edema. Gastrointestinal system: Abdomen is nondistended, soft and nontender. No organomegaly or masses felt. Normal bowel sounds heard. Central nervous system: Alert and oriented. No focal neurological deficits. Extremities: Symmetric  5 x 5 power. Skin: No rashes, lesions or ulcers Psychiatry: Judgement and insight appear normal. Mood & affect appropriate.   Data Reviewed: I have personally reviewed following labs and imaging studies  CBC:  Recent Labs Lab 08/03/16 0840  WBC 8.7  HGB 18.7*  HCT 57.1*  MCV 77.2*  PLT 093   Basic Metabolic Panel:  Recent Labs Lab 08/03/16 0840 08/04/16 0340 08/04/16 1004  NA 139 138 138  K 3.3* 3.0* 3.5  CL 100* 100* 100*  CO2 29 27 28   GLUCOSE 149* 133* 123*  BUN 30* 24* 23*  CREATININE 2.75* 2.48* 2.47*  CALCIUM 8.8* 8.6* 8.5*   GFR: Estimated Creatinine Clearance: 37.6 mL/min (by  C-G formula based on SCr of 2.47 mg/dL). Liver Function Tests:  Recent Labs Lab 08/03/16 0840  AST 20  ALT 23  ALKPHOS 70  BILITOT 0.7  PROT 8.3*  ALBUMIN 4.0   No results for input(s): LIPASE, AMYLASE in the last 168 hours. No results for input(s): AMMONIA in the last 168 hours. Coagulation Profile:  Recent Labs Lab 08/03/16 0840  INR 1.00   Cardiac Enzymes:  Recent Labs Lab 08/03/16 0840 08/03/16 1250  TROPONINI 0.04* 0.04*   BNP (last 3 results) No results for input(s): PROBNP in the last 8760 hours. HbA1C: No results for input(s): HGBA1C in the last 72 hours. CBG: No results for input(s): GLUCAP in the last 168 hours. Lipid Profile:  Recent Labs  08/04/16 0340  CHOL 161  HDL 35*  LDLCALC 92  TRIG 172*  CHOLHDL 4.6   Thyroid Function Tests:  Recent Labs  08/03/16 0840  TSH 2.130   Anemia Panel: No results for input(s): VITAMINB12, FOLATE, FERRITIN, TIBC, IRON, RETICCTPCT in the last 72 hours. Urine analysis:    Component Value Date/Time   COLORURINE YELLOW 08/03/2016 [place] 08/03/2016 1335   LABSPEC 1.016 08/03/2016 1335   PHURINE 7.0 08/03/2016 1335   GLUCOSEU 100 (A) 08/03/2016 1335   HGBUR TRACE (A) 08/03/2016 1335   BILIRUBINUR NEGATIVE 08/03/2016 1335   KETONESUR NEGATIVE 08/03/2016 1335   PROTEINUR >300 (A) 08/03/2016 1335   NITRITE NEGATIVE 08/03/2016 1335   LEUKOCYTESUR NEGATIVE 08/03/2016 1335   Sepsis Labs: @LABRCNTIP (procalcitonin:4,lacticidven:4)   )No results found for this or any previous visit (from the past 240 hour(s)).    Radiology Studies: Dg Chest 2 View Result Date: 08/03/2016 Likely basilar atelectasis/ scarring with no evidence of lobar pneumonia.   Ct Head Wo Contrast Result Date: 08/03/2016  No CT evidence of acute intracranial abnormality. Evidence of microvascular ischemic disease. Mixed density dermal lesions in the right temporal soft tissues and left high frontal region at the  vertex. These lesion should be amenable to direct inspection. Signed, Dulcy Fanny. Earleen Newport, DO [facility] Radiology Electronically Signed   By: Corrie Mckusick D.O.   On: 08/03/2016 09:21   Mr Brain Wo Contrast Result Date: 08/03/2016  1. No acute or subacute infarct. 2. Severe age advanced diffuse periventricular and subcortical white matter disease bilaterally with extensive ischemic changes throughout the basal ganglia. This likely reflects the sequela of chronic microvascular disease. 3. 2 scalp lesions as described. These likely represent sebaceous cysts. The areas are amenable to direct visualization.  US Renal Result Date: 08/04/2016 Increased renal parenchymal echogenicity consistent with chronic medical renal disease. No hydronephrosis. Electronically Signed   By: Jeb Levering M.D.   On: 08/04/2016 00:42    Scheduled Meds: . amLODipine  10 mg  Oral Daily  . aspirin  300 mg Rectal Daily   Or  . aspirin  325 mg Oral Daily  . enoxaparin (LOVENOX) injection  40 mg Subcutaneous Q24H  . metoprolol tartrate  50 mg Oral BID   Continuous Infusions: . sodium chloride 10 mL/hr at 08/04/16 0949     LOS: 0 days    Time spent: 15 minutes  Greater than 50% of the time spent on counseling and coordinating the care.   Leisa Lenz, MD [facility] Pager [phone]  If 7PM-7AM, please contact night-coverage [email or web address] Password [facility] 08/04/2016, 11:42 AM

## 2016-08-04 NOTE — Progress Notes (Signed)
SLP received order to cancel SLE, please reorder if indicated.   Fred Harrison, [facility] SLP [phone]

## 2016-08-04 NOTE — Progress Notes (Signed)
*  PRELIMINARY RESULTS* Vascular Ultrasound Carotid Duplex has been completed.  Preliminary findings: Bilateral: No significant (1-39%) ICA stenosis. Antegrade vertebral flow.   Landry Mellow, RDMS, RVT  08/04/2016, 10:39 AM

## 2016-08-04 NOTE — Evaluation (Signed)
  Occupational Therapy Evaluation Patient Details Name: Raden Byington MRN: [id number] DOB: 23-Mar-1966 Today's Date: 08/04/2016    History of Present Illness Pt admitted with left-sided headache and right arm numbness   Clinical Impression   Symptoms have resolved. No further OT indicated. Pt is I with ADL activity     Follow Up Recommendations  No OT follow up    Equipment Recommendations  None recommended by OT           Mobility Bed Mobility            Pt is Independent      Transfers    Pt is Independent                       ADL Overall ADL's : Independent                                                       Pertinent Vitals/Pain Pain Assessment: No/denies pain     Hand Dominance Right   Extremity/Trunk Assessment Upper Extremity Assessment Upper Extremity Assessment: Overall WFL for tasks assessed           Communication     Cognition Arousal/Alertness: Awake/alert Behavior During Therapy: WFL for tasks assessed/performed Overall Cognitive Status: Within Functional Limits for tasks assessed                                Home Living Family/patient expects to be discharged to:: Private residence Living Arrangements: Children Available Help at Discharge: Family                                    Prior Functioning/Environment Level of Independence: Independent                      OT Goals(Current goals can be found in the care plan section) Acute Rehab OT Goals Patient Stated Goal: get back to work OT Goal Formulation: With patient  OT Frequency:      End of Session    Activity Tolerance: Patient tolerated treatment well Patient left: in chair;with call bell/phone within reach   Time: 1113-1128 OT Time Calculation (min): 15 min Charges:  OT General Charges $OT Visit: 1 Procedure OT Evaluation $OT Eval Low Complexity: 1 Procedure G-Codes: OT G-codes **NOT FOR  INPATIENT CLASS** Functional Assessment Tool Used: clinical observation Functional Limitation: Self care Self Care Current Status (R9758): 0 percent impaired, limited or restricted Self Care Goal Status (I3254): 0 percent impaired, limited or restricted Self Care Discharge Status (D8264): 0 percent impaired, limited or restricted  [place], Escher Harr D 08/04/2016, 11:49 AM

## 2016-08-04 NOTE — Care Management Note (Signed)
Case Management Note  Patient Details  Name: Talyn Dessert MRN: [id number] Date of Birth: Apr 25, 1966  Subjective/Objective:50 y/o m admitted w/TIA. From home. No insurance,no pcp. Will provide pcp listing as resource-encouraging [place], provide [employer] med list.Financial counselor following for resources-medicaid potential.                    Action/Plan:d/c plan home.   Expected Discharge Date:                  Expected Discharge Plan:  Home/Self Care  In-House Referral:     Discharge planning Services  CM Consult, [facility]  Post Acute Care Choice:    Choice offered to:     DME Arranged:    DME Agency:     HH Arranged:    HH Agency:     Status of Service:  In process, will continue to follow  If discussed at Long Length of Stay Meetings, dates discussed:    Additional Comments:  Dessa Phi, RN 08/04/2016, 12:47 PM

## 2016-08-05 DIAGNOSIS — E876 Hypokalemia: Secondary | ICD-10-CM

## 2016-08-05 LAB — BASIC METABOLIC PANEL
ANION GAP: 9 (ref 5–15)
BUN: 31 mg/dL — ABNORMAL HIGH (ref 6–20)
CALCIUM: 8.7 mg/dL — AB (ref 8.9–10.3)
CO2: 29 mmol/L (ref 22–32)
CREATININE: 2.55 mg/dL — AB (ref 0.61–1.24)
Chloride: 98 mmol/L — ABNORMAL LOW (ref 101–111)
GFR calc Af Amer: 32 mL/min — ABNORMAL LOW (ref >60)
GFR, EST NON AFRICAN AMERICAN: 27 mL/min — AB (ref >60)
GLUCOSE: 109 mg/dL — AB (ref 65–99)
Potassium: 3.1 mmol/L — ABNORMAL LOW (ref 3.5–5.1)
Sodium: 136 mmol/L (ref 135–145)

## 2016-08-05 LAB — HEMOGLOBIN A1C
HEMOGLOBIN A1C: 5.9 % — AB (ref 4.8–5.6)
MEAN PLASMA GLUCOSE: 123 mg/dL

## 2016-08-05 MED ORDER — POTASSIUM CHLORIDE CRYS ER 20 MEQ PO TBCR
40.0000 meq | EXTENDED_RELEASE_TABLET | Freq: Once | ORAL | Status: AC
  Administered 2016-08-05: 40 meq via ORAL
  Filled 2016-08-05: qty 2

## 2016-08-05 MED ORDER — AMLODIPINE BESYLATE 10 MG PO TABS: 10.0000 mg | ORAL_TABLET | Freq: Every day | ORAL | 0 refills | Status: DC

## 2016-08-05 MED ORDER — LABETALOL HCL 100 MG PO TABS
200.0000 mg | ORAL_TABLET | Freq: Two times a day (BID) | ORAL | Status: DC
  Administered 2016-08-05 – 2016-08-06 (×3): 200 mg via ORAL
  Filled 2016-08-05 (×3): qty 2

## 2016-08-05 MED ORDER — HYDRALAZINE HCL 20 MG/ML IJ SOLN
10.0000 mg | Freq: Once | INTRAMUSCULAR | Status: AC
  Administered 2016-08-05: 10 mg via INTRAVENOUS
  Filled 2016-08-05: qty 1

## 2016-08-05 MED ORDER — HYDRALAZINE HCL 25 MG PO TABS: 25.0000 mg | ORAL_TABLET | Freq: Three times a day (TID) | ORAL | 0 refills | Status: DC

## 2016-08-05 MED ORDER — ASPIRIN 81 MG PO TABS: 81.0000 mg | ORAL_TABLET | Freq: Every day | ORAL | 0 refills | Status: DC

## 2016-08-05 MED ORDER — HYDRALAZINE HCL 25 MG PO TABS
25.0000 mg | ORAL_TABLET | Freq: Three times a day (TID) | ORAL | Status: DC
  Administered 2016-08-05: 25 mg via ORAL
  Filled 2016-08-05: qty 1

## 2016-08-05 MED ORDER — LABETALOL HCL 200 MG PO TABS: 200.0000 mg | ORAL_TABLET | Freq: Two times a day (BID) | ORAL | 0 refills | Status: DC

## 2016-08-05 NOTE — Progress Notes (Signed)
Patient ID: Fred Harrison, male   DOB: [birth year], 50 y.o.   MRN: [id number]  PROGRESS NOTE    Fred Harrison  EZM:[id number] DOB: 1966-11-18 DOA: 08/03/2016  PCP: No PCP Per Patient   Brief Narrative:  37 -year-old male with past medical history significant for hypertension, chronic kidney disease, TIA about 7 years ago. Patient is from [place] area and has moved to this area and has not been compliant with medications. He presented with headaches and numbness over the right side of the body. Patient did not have reports of weakness or facial droop or slurred speech or vision changes. 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 His creatinine on the admission was 2.7 and he does say he has had chronic kidney disease but not sure of his baseline creatinine. The troponin level was 0.04 on the admission and 12-lead EKG showed sinus tachycardia. Of note neurology was not consulted as patient reported improvement in his symptoms while in ED.  Assessment & Plan:   TIA / Headache / Right sided numbness  - TIA order set utilized - CT head and MRI without acute intracranial findings - Carotid Doppler did not show significant stenosis bilaterally - 2-D echo showed ejection fraction of 50% with grade 2 diastolic dysfunction - Continue daily aspirin  Acute kidney injury - Suspect chronic kidney disease however we have no other values for comparison - Creatinine has improved since the admission from 2.7 to 2.4 - Renal ultrasound does show chronic disease - I spoke with nephrology on call, recommendation for outpatient follow-up. Appointment made for 08/19/2016 at 1045  Essential hypertension - blood pressure in 160s-170s so we added hydralazine for better blood pressure control - BP then on soft side so Currently on Norvasc and labetalol 200 mg twice daily which is likely contributing regimen he will be sent home on  discharge   Elevated troponin - Likely demand ischemia from acute on chronic renal insufficiency - No reports of chest pain   DVT prophylaxis: Lovenox subcutaneous Code Status: full code  Family Communication: No family at the bedside Disposition Plan: home liekly 9/13   Consultants:   None   Procedures:   Carotid doppler - no significant stenosis   ECHO - EF 50-55% and grade 2 diastolic dysfunction   Antimicrobials:   None    Subjective: No overnight events.   Objective: Vitals:   08/05/16 1220 08/05/16 1500 08/05/16 1509 08/05/16 1614  BP: (!) 171/106 (!) 81/51 119/85 134/79  Pulse: 80   77  Resp:      Temp:      TempSrc:      SpO2:      Weight:      Height:        Intake/Output Summary (Last 24 hours) at 08/05/16 1745 Last data filed at 08/05/16 1448  Gross per 24 hour  Intake              840 ml  Output                0 ml  Net              840 ml   Filed Weights   08/03/16 0824  Weight: 90.7 kg (200 lb)    Examination:  General exam: Appears calm and comfortable, no distress  Respiratory system: Respiratory effort normal.No wheezing  Cardiovascular system: S1 & S2 heard, RRR. Gastrointestinal system: Abdomen is  nondistended, soft and nontender. Normal bowel sounds heard. Central nervous system: No focal neurological deficits. Extremities: Symmetric 5 x 5 power. Skin: warm, dry  Psychiatry: Mood & affect appropriate.   Data Reviewed: I have personally reviewed following labs and imaging studies  CBC:  Recent Labs Lab 08/03/16 0840  WBC 8.7  HGB 18.7*  HCT 57.1*  MCV 77.2*  PLT 782   Basic Metabolic Panel:  Recent Labs Lab 08/03/16 0840 08/04/16 0340 08/04/16 1004 08/05/16 0910  NA 139 138 138 136  K 3.3* 3.0* 3.5 3.1*  CL 100* 100* 100* 98*  CO2 29 27 28 29   GLUCOSE 149* 133* 123* 109*  BUN 30* 24* 23* 31*  CREATININE 2.75* 2.48* 2.47* 2.55*  CALCIUM 8.8* 8.6* 8.5* 8.7*   GFR: Estimated Creatinine Clearance: 36.4  mL/min (by C-G formula based on SCr of 2.55 mg/dL). Liver Function Tests:  Recent Labs Lab 08/03/16 0840  AST 20  ALT 23  ALKPHOS 70  BILITOT 0.7  PROT 8.3*  ALBUMIN 4.0   No results for input(s): LIPASE, AMYLASE in the last 168 hours. No results for input(s): AMMONIA in the last 168 hours. Coagulation Profile:  Recent Labs Lab 08/03/16 0840  INR 1.00   Cardiac Enzymes:  Recent Labs Lab 08/03/16 0840 08/03/16 1250  TROPONINI 0.04* 0.04*   BNP (last 3 results) No results for input(s): PROBNP in the last 8760 hours. HbA1C:  Recent Labs  08/04/16 0340  HGBA1C 5.9*   CBG: No results for input(s): GLUCAP in the last 168 hours. Lipid Profile:  Recent Labs  08/04/16 0340  CHOL 161  HDL 35*  LDLCALC 92  TRIG 172*  CHOLHDL 4.6   Thyroid Function Tests:  Recent Labs  08/03/16 0840  TSH 2.130   Anemia Panel: No results for input(s): VITAMINB12, FOLATE, FERRITIN, TIBC, IRON, RETICCTPCT in the last 72 hours. Urine analysis:    Component Value Date/Time   COLORURINE YELLOW 08/03/2016 [place] 08/03/2016 1335   LABSPEC 1.016 08/03/2016 1335   PHURINE 7.0 08/03/2016 1335   GLUCOSEU 100 (A) 08/03/2016 1335   HGBUR TRACE (A) 08/03/2016 1335   BILIRUBINUR NEGATIVE 08/03/2016 1335   KETONESUR NEGATIVE 08/03/2016 1335   PROTEINUR >300 (A) 08/03/2016 1335   NITRITE NEGATIVE 08/03/2016 1335   LEUKOCYTESUR NEGATIVE 08/03/2016 1335   Sepsis Labs: @LABRCNTIP (procalcitonin:4,lacticidven:4)   )No results found for this or any previous visit (from the past 240 hour(s)).    Radiology Studies: Dg Chest 2 View Result Date: 08/03/2016 Likely basilar atelectasis/ scarring with no evidence of lobar pneumonia.   Ct Head Wo Contrast Result Date: 08/03/2016  No CT evidence of acute intracranial abnormality. Evidence of microvascular ischemic disease. Mixed density dermal lesions in the right temporal soft tissues and left high frontal region at the  vertex. These lesion should be amenable to direct inspection. Signed, Dulcy Fanny. Earleen Newport, DO [facility] Radiology Electronically Signed   By: Corrie Mckusick D.O.   On: 08/03/2016 09:21   Mr Brain Wo Contrast Result Date: 08/03/2016  1. No acute or subacute infarct. 2. Severe age advanced diffuse periventricular and subcortical white matter disease bilaterally with extensive ischemic changes throughout the basal ganglia. This likely reflects the sequela of chronic microvascular disease. 3. 2 scalp lesions as described. These likely represent sebaceous cysts. The areas are amenable to direct visualization.  US Renal Result Date: 08/04/2016 Increased renal parenchymal echogenicity consistent with chronic medical renal disease. No hydronephrosis. Electronically Signed  By: Jeb Levering M.D.   On: 08/04/2016 00:42    Scheduled Meds: . amLODipine  10 mg Oral Daily  . aspirin  300 mg Rectal Daily   Or  . aspirin  325 mg Oral Daily  . enoxaparin (LOVENOX) injection  40 mg Subcutaneous Q24H  . labetalol  200 mg Oral BID   Continuous Infusions: . sodium chloride 10 mL/hr at 08/04/16 0949     LOS: 0 days    Time spent: 15 minutes  Greater than 50% of the time spent on counseling and coordinating the care.   Leisa Lenz, MD [facility] Pager [phone]  If 7PM-7AM, please contact night-coverage [email or web address] Password [facility] 08/05/2016, 5:45 PM

## 2016-08-05 NOTE — Discharge Instructions (Signed)
Chronic Kidney Disease °Chronic kidney disease occurs when the kidneys are damaged over a long period. The kidneys are two organs that lie on either side of the spine between the middle of the back and the front of the abdomen. The kidneys: °· Remove wastes and extra water from the blood. °· Produce important hormones. These help keep bones strong, regulate blood pressure, and help create red blood cells. °· Balance the fluids and chemicals in the blood and tissues. °A small amount of kidney damage may not cause problems, but a large amount of damage may make it difficult or impossible for the kidneys to work the way they should. If steps are not taken to slow down the kidney damage or stop it from getting worse, the kidneys may stop working permanently. Most of the time, chronic kidney disease does not go away. However, it can often be controlled, and those with the disease can usually live normal lives. °CAUSES °The most common causes of chronic kidney disease are diabetes and high blood pressure (hypertension). Chronic kidney disease may also be caused by: °· Diseases that cause the kidneys' filters to become inflamed. °· Diseases that affect the immune system. °· Genetic diseases. °· Medicines that damage the kidneys, such as anti-inflammatory medicines. °· Poisoning or exposure to toxic substances. °· A reoccurring kidney or urinary infection. °· A problem with urine flow. This may be caused by: °¨ Cancer. °¨ Kidney stones. °¨ An enlarged prostate in males. °SIGNS AND SYMPTOMS °Because the kidney damage in chronic kidney disease occurs slowly, symptoms develop slowly and may not be obvious until the kidney damage becomes severe. A person may have a kidney disease for years without showing any symptoms. Symptoms can include: °· Swelling (edema) of the legs, ankles, or feet. °· Tiredness (lethargy). °· Nausea or vomiting. °· Confusion. °· Problems with urination, such as: °¨ Decreased urine  production. °¨ Frequent urination, especially at night. °¨ Frequent accidents in children who are potty trained. °· Muscle twitches and cramps. °· Shortness of breath. °· Weakness. °· Persistent itchiness. °· Loss of appetite. °· Metallic taste in the mouth. °· Trouble sleeping. °· Slowed development in children. °· Short stature in children. °DIAGNOSIS °Chronic kidney disease may be detected and diagnosed by tests, including blood, urine, imaging, or kidney biopsy tests. °TREATMENT °Most chronic kidney diseases cannot be cured. Treatment usually involves relieving symptoms and preventing or slowing the progression of the disease. Treatment may include: °· A special diet. You may need to avoid alcohol and foods that are salty and high in potassium. °· Medicines. These may: °¨ Lower blood pressure. °¨ Relieve anemia. °¨ Relieve swelling. °¨ Protect the bones. °HOME CARE INSTRUCTIONS °· Follow your prescribed diet.  Your health care provider may instruct you to limit daily salt (sodium) and protein intake. °· Take medicines only as directed by your health care provider. Do not take any new medicines (prescription, over-the-counter, or nutritional supplements) unless approved by your health care provider. Many medicines can worsen your kidney damage or need to have the dose adjusted.   °· Quit smoking if you smoke. Talk to your health care provider about a smoking cessation program. °· Keep all follow-up visits as directed by your health care provider. °· Monitor your blood pressure. °· Start or continue an exercise plan. °· Get immunizations as directed by your health care provider. °· Take vitamin and mineral supplements as directed by your health care provider. °SEEK IMMEDIATE MEDICAL CARE IF: °· Your symptoms get worse or you develop   new symptoms. °· You develop symptoms of end-stage kidney disease. These include: °¨ Headaches. °¨ Abnormally dark or light skin. °¨ Numbness in the hands or feet. °¨ Easy  bruising. °¨ Frequent hiccups. °¨ Menstruation stops. °· You have a fever. °· You have decreased urine production. °· You have pain or bleeding when urinating. °MAKE SURE YOU: °· Understand these instructions. °· Will watch your condition. °· Will get help right away if you are not doing well or get worse. °FOR MORE INFORMATION  °· American Association of Kidney Patients: www.aakp.org °· National Kidney Foundation: www.kidney.org °· American Kidney Fund: www.akfinc.org °· [employer] [facility]: www.lifeoptions.org and www.kidneyschool.org °  °This information is not intended to replace advice given to you by your health care provider. Make sure you discuss any questions you have with your health care provider. °  °Document Released: 08/19/2008 Document Revised: 12/01/2014 Document Reviewed: 07/09/2012 °Elsevier Interactive Patient Education ©2016 Elsevier Inc. ° °

## 2016-08-05 NOTE — Progress Notes (Signed)
Notified MD of pt's BP dropping to 81/51 w/ c/o sweating and dizziness, rechecked after 10 mins and BP 119/85, instructed to recheck BP in 1 hour and page with results

## 2016-08-05 NOTE — Progress Notes (Signed)
MD aware of BP, pt will not be d/c'd home this evening to continue to monitor

## 2016-08-06 DIAGNOSIS — E876 Hypokalemia: Secondary | ICD-10-CM

## 2016-08-06 DIAGNOSIS — G458 Other transient cerebral ischemic attacks and related syndromes: Secondary | ICD-10-CM

## 2016-08-06 MED ORDER — HYDRALAZINE HCL 20 MG/ML IJ SOLN: 10.0000 mg | Freq: Once | INTRAMUSCULAR | Status: DC

## 2016-08-06 MED FILL — LABETALOL HCL 200 MG TABLET: 200 | 30 days supply | Qty: 60 | Fill #0

## 2016-08-06 MED FILL — AMLODIPINE BESYLATE 10 MG T: 10 | 30 days supply | Qty: 30 | Fill #0

## 2016-08-06 NOTE — Care Management Note (Signed)
Case Management Note  Patient Details  Name: Fred Harrison MRN: [id number] Date of Birth: [birth year]  Subjective/Objective: Provided patient w/pcp appt @ [facility]-see d/c instruction f/u section.Patient able to go to [facility] pharmacy for meds @ d/c-$4-norvasc,ASA-OTC, $10-labetalol. Patient states he can afford. Patient voiced understanding to go to [facility] @ d/c for scripts to be filled. Also provided [facility] as resource. Patient voiced understanding.No further CM needs.                   Action/Plan:d/c home.   Expected Discharge Date:                  Expected Discharge Plan:  Home/Self Care  In-House Referral:     Discharge planning Services  CM Consult, [facility]  Post Acute Care Choice:    Choice offered to:     DME Arranged:    DME Agency:     HH Arranged:    [place] Agency:     Status of Service:  Completed, signed off  If discussed at [employer] of Stay Meetings, dates discussed:    Additional Comments:  Dessa Phi, RN 08/06/2016, 12:27 PM

## 2016-08-06 NOTE — Discharge Summary (Signed)
Physician Discharge Summary  Fred Harrison VQQ:[id number] DOB: 01-16-1966 DOA: 08/03/2016  PCP: No PCP Per Patient; will follow-up in [place]., August 19 2016  Admit date: 08/03/2016 Discharge date: 08/06/2016  Recommendations for Outpatient Follow-up:  1. New blood pressure medications: Norvasc and labetalol  Discharge Diagnoses:  Active Problems:   TIA (transient ischemic attack)   AKI (acute kidney injury) ([place])   CKD (chronic kidney disease)   HTN (hypertension)   Non compliance w medication regimen   Hypokalemia    Discharge Condition: stable   Diet recommendation: as tolerated   History of present illness:  50 -year-old male with past medical history significant for hypertension, chronic kidney disease, TIA about 7 years ago. Patient is from [place] area and has moved to this area and has not been compliant with medications. He presented with headaches and numbness over the right side of the body. Patient did not have reports of weakness or facial droop or slurred speech or vision changes. 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 His creatinine on the admission was 2.7 and he does say he has had chronic kidney disease but not sure of his baseline creatinine. The troponin level was 0.04 on the admission and 12-lead EKG showed sinus tachycardia. Of note neurology was not consulted as patient reported improvement in his symptoms while in ED.  Hospital Course:    Assessment & Plan:   TIA / Headache / Right sided numbness  - TIA order set utilized - CT head and MRI without acute intracranial findings - Carotid Doppler did not show significant stenosis bilaterally - 2-D echo showed ejection fraction of 50% with grade 2 diastolic dysfunction - Continue daily aspirin  Acute kidney injury - Suspect chronic kidney disease however we have no other values for comparison - Creatinine has  improved since the admission from 2.7 to 2.4 - Renal ultrasound does show chronic disease - I spoke with nephrology on call, recommendation for outpatient follow-up. Appointment made for 08/19/2016 at 1045  Essential hypertension - Will continue Norvasc 10 mg daily and labetalol 200 mg BID on discharge   Elevated troponin - Likely demand ischemia from acute on chronic renal insufficiency - No reports of chest pain   DVT prophylaxis: Lovenox subcutaneous Code Status: full code  Family Communication: No family at the bedside   Consultants:   None   Procedures:   Carotid doppler - no significant stenosis   ECHO - EF 50-55% and grade 2 diastolic dysfunction   Antimicrobials:   None     Signed:  Leisa Lenz, MD  [facility] 08/06/2016, 11:37 AM  Pager #: [phone]  Time spent in minutes: less than 30 minutes    Discharge Exam: Vitals:   08/06/16 0624 08/06/16 0946  BP: (!) 159/101 138/90  Pulse:  91  Resp:  20  Temp:     Vitals:   08/05/16 2009 08/06/16 0500 08/06/16 0624 08/06/16 0946  BP: (!) 162/106 (!) 165/107 (!) 159/101 138/90  Pulse: 83 84  91  Resp: 20 20  20   Temp: 98.6 F (37 C) 97.7 F (36.5 C)    TempSrc: Oral Oral    SpO2: 99% 98%    Weight:      Height:        General: Pt is alert, follows commands appropriately, not in acute distress Cardiovascular: Regular rate and rhythm, S1/S2 +, no murmurs Respiratory: Clear to auscultation bilaterally,  no wheezing, no crackles, no rhonchi Abdominal: Soft, non tender, non distended, bowel sounds +, no guarding Extremities: no edema, no cyanosis, pulses palpable bilaterally DP and PT Neuro: Grossly nonfocal  Discharge Instructions  Discharge Instructions    Call MD for:  persistant nausea and vomiting    Complete by:  As directed    Call MD for:  redness, tenderness, or signs of infection (pain, swelling, redness, odor or green/yellow discharge around incision site)     Complete by:  As directed    Call MD for:  severe uncontrolled pain    Complete by:  As directed    Diet - low sodium heart healthy    Complete by:  As directed    Diet - low sodium heart healthy    Complete by:  As directed    Increase activity slowly    Complete by:  As directed    Increase activity slowly    Complete by:  As directed        Medication List    TAKE these medications   amLODipine 10 MG tablet Commonly known as:  NORVASC Take 1 tablet (10 mg total) by mouth daily.   aspirin 81 MG tablet Take 1 tablet (81 mg total) by mouth daily.   labetalol 200 MG tablet Commonly known as:  NORMODYNE Take 1 tablet (200 mg total) by mouth 2 (two) times daily.      Follow-up Information    Sherril Croon, MD Follow up on 08/19/2016.   Specialty:  Nephrology Why:  at 10:45 am Contact information: [place] [address] [phone]            The results of significant diagnostics from this hospitalization (including imaging, microbiology, ancillary and laboratory) are listed below for reference.    Significant Diagnostic Studies: Dg Chest 2 View  Result Date: 08/03/2016 CLINICAL DATA:  50 year old male with a history of right arm numbness EXAM: CHEST  2 VIEW COMPARISON:  None. FINDINGS: Cardiomediastinal silhouette within normal limits. No evidence of central vascular congestion. No pneumothorax or pleural effusion. Interstitial opacities at the lung bases. No confluent airspace disease. No displaced fracture. IMPRESSION: Likely basilar atelectasis/ scarring with no evidence of lobar pneumonia. Signed, Dulcy Fanny. Earleen Newport, DO [facility]iology Electronically Signed   By: Corrie Mckusick D.O.   On: 08/03/2016 09:28   Ct Head Wo Contrast  Result Date: 08/03/2016 CLINICAL DATA:  50 year old male with a history of right arm numbness EXAM: CT HEAD WITHOUT CONTRAST TECHNIQUE: Contiguous axial images were obtained  from the base of the skull through the vertex without intravenous contrast. COMPARISON:  None. FINDINGS: Brain: No acute intracranial hemorrhage. No midline shift or mass effect. Gray-white differentiation maintained. Unremarkable appearance of the ventricular system. Confluent hypodensity in the periventricular white matter, within frontal and parietal regions. Vascular: Minimal calcifications of the anterior vasculature. Skull: No acute fracture.  No aggressive bone lesion identified. Sinuses/Orbits: Unremarkable appearance of the orbits. Mastoid air cells clear. No middle ear effusion. No significant sinus disease. Other: Mixed density benign-appearing rounded soft tissue lesions within the right height temporal region measuring 1.5 cm and in the high left frontal region at the vertex measuring 3.3 cm. No involvement of the underlying calvarium. IMPRESSION: No CT evidence of acute intracranial abnormality. Evidence of microvascular ischemic disease. Mixed density dermal lesions in the right temporal soft tissues and left high frontal region at the vertex. These lesion should be amenable to direct inspection.  Signed, Dulcy Fanny. Earleen Newport, DO [facility] Radiology Electronically Signed   By: Corrie Mckusick D.O.   On: 08/03/2016 09:21   Mr Brain Wo Contrast  Result Date: 08/03/2016 CLINICAL DATA:  Headaches over the last week. Personal history of chronic hypertension and chronic kidney disease. The patient awoke this morning after sleeping for 2 hours this with a severe left-sided headache and right arm numbness. EXAM: MRI HEAD WITHOUT CONTRAST TECHNIQUE: Multiplanar, multiecho pulse sequences of the brain and surrounding structures were obtained without intravenous contrast. COMPARISON:  CT head without contrast FINDINGS: Brain: Diffusion-weighted images demonstrating area of T2 shine through in the posterior right frontal white matter. No acute or subacute infarction  is present. Mild age advanced atrophy and severe diffuse white matter changes are present bilaterally. Extensive white matter ischemic changes are present throughout the basal ganglia and thalami. Dilated perivascular spaces are noted. White matter changes extend into the brainstem. Remote lacunar infarcts are present in the cerebellum bilaterally. The internal auditory canals are within normal limits bilaterally. Vascular: Flow is present in the major intracranial arteries bilaterally. Skull and upper cervical spine: The skullbase is within normal limits. Midline sagittal structures are unremarkable. Sinuses/Orbits: The paranasal sinuses and mastoid air cells are clear. The globes and orbits are intact. Other: A 3.4 cm left paramidline mixed intensity dermal lesion likely represent likely represents a sebaceous cyst. A 1.6 cm calcified lesion is present in the right temporal scalp. IMPRESSION: 1. No acute or subacute infarct. 2. Severe age advanced diffuse periventricular and subcortical white matter disease bilaterally with extensive ischemic changes throughout the basal ganglia. This likely reflects the sequela of chronic microvascular disease. 3. 2 scalp lesions as described. These likely represent sebaceous cysts. The areas are amenable to direct visualization. Electronically Signed   By: San Morelle M.D.   On: 08/03/2016 14:55   US Renal  Result Date: 08/04/2016 CLINICAL DATA:  Acute renal failure on chronic kidney disease. EXAM: RENAL / URINARY TRACT ULTRASOUND COMPLETE COMPARISON:  None. FINDINGS: Right Kidney: Length: 11.4 cm. Mild diffuse increased renal echogenicity No mass or hydronephrosis visualized. Left Kidney: Length: 11.9 cm. Mild diffuse increased renal echogenicity. No mass or hydronephrosis visualized. Bladder: Appears normal for degree of bladder distention. IMPRESSION: Increased renal parenchymal echogenicity consistent with chronic medical renal disease. No hydronephrosis.  Electronically Signed   By: Jeb Levering M.D.   On: 08/04/2016 00:42    Microbiology: No results found for this or any previous visit (from the past 240 hour(s)).   Labs: Basic Metabolic Panel:  Recent Labs Lab 08/03/16 0840 08/04/16 0340 08/04/16 1004 08/05/16 0910  NA 139 138 138 136  K 3.3* 3.0* 3.5 3.1*  CL 100* 100* 100* 98*  CO2 29 27 28 29   GLUCOSE 149* 133* 123* 109*  BUN 30* 24* 23* 31*  CREATININE 2.75* 2.48* 2.47* 2.55*  CALCIUM 8.8* 8.6* 8.5* 8.7*   Liver Function Tests:  Recent Labs Lab 08/03/16 0840  AST 20  ALT 23  ALKPHOS 70  BILITOT 0.7  PROT 8.3*  ALBUMIN 4.0   No results for input(s): LIPASE, AMYLASE in the last 168 hours. No results for input(s): AMMONIA in the last 168 hours. CBC:  Recent Labs Lab 08/03/16 0840  WBC 8.7  HGB 18.7*  HCT 57.1*  MCV 77.2*  PLT 376   Cardiac Enzymes:  Recent Labs Lab 08/03/16 0840 08/03/16 1250  TROPONINI 0.04* 0.04*   BNP: BNP (last 3 results) No results for input(s):  BNP in the last 8760 hours.  ProBNP (last 3 results) No results for input(s): PROBNP in the last 8760 hours.  CBG: No results for input(s): GLUCAP in the last 168 hours.

## 2016-08-13 ENCOUNTER — Ambulatory Visit: Payer: Self-pay | Attending: Internal Medicine | Admitting: Internal Medicine

## 2016-08-13 ENCOUNTER — Encounter: Payer: Self-pay | Admitting: Internal Medicine

## 2016-08-13 DIAGNOSIS — Z8673 Personal history of transient ischemic attack (TIA), and cerebral infarction without residual deficits: Secondary | ICD-10-CM | POA: Insufficient documentation

## 2016-08-13 DIAGNOSIS — I129 Hypertensive chronic kidney disease with stage 1 through stage 4 chronic kidney disease, or unspecified chronic kidney disease: Secondary | ICD-10-CM | POA: Insufficient documentation

## 2016-08-13 DIAGNOSIS — N189 Chronic kidney disease, unspecified: Secondary | ICD-10-CM | POA: Insufficient documentation

## 2016-08-13 DIAGNOSIS — G458 Other transient cerebral ischemic attacks and related syndromes: Secondary | ICD-10-CM

## 2016-08-13 DIAGNOSIS — I10 Essential (primary) hypertension: Secondary | ICD-10-CM

## 2016-08-13 DIAGNOSIS — Z79899 Other long term (current) drug therapy: Secondary | ICD-10-CM | POA: Insufficient documentation

## 2016-08-13 DIAGNOSIS — Z87891 Personal history of nicotine dependence: Secondary | ICD-10-CM | POA: Insufficient documentation

## 2016-08-13 DIAGNOSIS — N183 Chronic kidney disease, stage 3 (moderate): Secondary | ICD-10-CM

## 2016-08-13 DIAGNOSIS — D582 Other hemoglobinopathies: Secondary | ICD-10-CM

## 2016-08-13 DIAGNOSIS — Z7982 Long term (current) use of aspirin: Secondary | ICD-10-CM | POA: Insufficient documentation

## 2016-08-13 LAB — CBC WITH DIFFERENTIAL/PLATELET
BASOS ABS: 60 {cells}/uL (ref 0–200)
Basophils Relative: 1 %
EOS ABS: 300 {cells}/uL (ref 15–500)
Eosinophils Relative: 5 %
HEMATOCRIT: 47 % (ref 38.5–50.0)
Hemoglobin: 15.6 g/dL (ref 13.2–17.1)
LYMPHS PCT: 19 %
Lymphs Abs: 1140 cells/uL (ref 850–3900)
MCH: 25.7 pg — ABNORMAL LOW (ref 27.0–33.0)
MCHC: 33.2 g/dL (ref 32.0–36.0)
MCV: 77.3 fL — AB (ref 80.0–100.0)
MONO ABS: 480 {cells}/uL (ref 200–950)
MPV: 10.8 fL (ref 7.5–12.5)
Monocytes Relative: 8 %
NEUTROS PCT: 67 %
Neutro Abs: 4020 cells/uL (ref 1500–7800)
Platelets: 489 10*3/uL — ABNORMAL HIGH (ref 140–400)
RBC: 6.08 MIL/uL — ABNORMAL HIGH (ref 4.20–5.80)
RDW: 15.7 % — AB (ref 11.0–15.0)
WBC: 6 10*3/uL (ref 3.8–10.8)

## 2016-08-13 LAB — BASIC METABOLIC PANEL
BUN: 28 mg/dL — AB (ref 7–25)
CHLORIDE: 102 mmol/L (ref 98–110)
CO2: 27 mmol/L (ref 20–31)
Calcium: 9.1 mg/dL (ref 8.6–10.3)
Creat: 2.92 mg/dL — ABNORMAL HIGH (ref 0.70–1.33)
Glucose, Bld: 86 mg/dL (ref 65–99)
POTASSIUM: 4.2 mmol/L (ref 3.5–5.3)
Sodium: 140 mmol/L (ref 135–146)

## 2016-08-13 LAB — FERRITIN: Ferritin: 229 ng/mL (ref 20–380)

## 2016-08-13 MED ORDER — ISOSORBIDE MONONITRATE ER 30 MG PO TB24: 30.0000 mg | ORAL_TABLET | Freq: Every day | ORAL | 6 refills | Status: DC

## 2016-08-13 MED FILL — ISOSORBIDE MN ER 30 MG TAB: 30 | 30 days supply | Qty: 30 | Fill #0

## 2016-08-13 NOTE — Progress Notes (Signed)
Patient wants information on starting advance directive.  Patient wants Dr. To know he has an apt set to see kidney specialist.   Patient has large bump on head. States that it may be a built up of calcium.   [facility] note  TIA- reviewed hosital stay and imaging results- on ASA. His sxs of TIA_ losing feeling right arm. Sxs quickly resolved. He states he had TIA about 8 years ago. Had carotid US: Findings consistent with 1-39 percent stenosis involving the   right internal carotid artery and the left internal carotid   artery.   Renal failure- chronic by patient's report. He has outpatient follow up on 08/19/16. He states that he was seeing nephrologist in [place]. Previously on metoprolol, nifedipine and oscal.  HTN. Tolerating meds- he admits that he wasn't taking meds prior to TIA. Had echo- consistent with diastolic dysfunction  Hyperglycemia- note A1C 5.9% on 09.11.17  Elevated hgb- 18.7g/dl. He notes he was smoking up until August 8th.   Past Medical History:  Diagnosis Date  . Hypertension   . TIA (transient ischemic attack)     Social History   Social History  . Marital status: Single    Spouse name: N/A  . Number of children: N/A  . Years of education: N/A   Occupational History  . Not on file.   Social History Main Topics  . Smoking status: Former Smoker    Types: Cigarettes    Quit date: 07/02/2016  . Smokeless tobacco: Never Used  . Alcohol use No  . Drug use: Unknown  . Sexual activity: Not on file   Other Topics Concern  . Not on file   Social History Narrative  . No narrative on file    No past surgical history on file.  Family History  Problem Relation Age of Onset  . Stroke Mother   . Hypertension Mother   . Heart attack Father   . Cancer Sister     No Known Allergies  Current Outpatient Prescriptions on File Prior to Visit  Medication Sig Dispense Refill  . amLODipine (NORVASC) 10 MG tablet Take 1 tablet (10 mg total) by mouth daily. 30  tablet 0  . aspirin 81 MG tablet Take 1 tablet (81 mg total) by mouth daily. 30 tablet 0  . labetalol (NORMODYNE) 200 MG tablet Take 1 tablet (200 mg total) by mouth 2 (two) times daily. 60 tablet 0   No current facility-administered medications on file prior to visit.      patient denies chest pain, shortness of breath, orthopnea. Denies lower extremity edema, abdominal pain, change in appetite, change in bowel movements. Patient denies rashes, musculoskeletal complaints. No other specific complaints in a complete review of systems.   BP (!) 146/90 (BP Location: Right Arm, Patient Position: Sitting, Cuff Size: Large)   Pulse 70   Temp 98.6 F (37 C) (Oral)   Resp 17   Ht 5\' 7"  (1.702 m)   Wt 198 lb 6.4 oz (90 kg)   SpO2 97%   BMI 31.07 kg/m  nad- chest CTA cv- reg rate Abd- soft, nontender extr- no edema Neuro- A&O, no motor or sensory deficits

## 2016-08-13 NOTE — Assessment & Plan Note (Signed)
Due to htn More aggressive control of BP Has f/u with nephrology Check labs today

## 2016-08-13 NOTE — Assessment & Plan Note (Signed)
Unclear etiology- Will check cbc and ferritin

## 2016-08-13 NOTE — Assessment & Plan Note (Signed)
Not adequately controlled Add imdur He has f/u with nephrology  See [place] in 2 months Suspect this is the cause of CKD

## 2016-08-13 NOTE — Assessment & Plan Note (Signed)
Sept 2017 carotid US: Findings consistent with 1-39 percent stenosis involving the   right internal carotid artery and the left internal carotid   artery.

## 2016-08-15 ENCOUNTER — Telehealth: Payer: Self-pay | Admitting: *Deleted

## 2016-08-15 NOTE — Telephone Encounter (Signed)
-----   Message from [name], MD sent at 08/14/2016  5:56 PM EDT ----- Needs to keep appointment with nephrology

## 2016-08-25 NOTE — Telephone Encounter (Signed)
-----   Message from [name], MD sent at 08/14/2016  5:56 PM EDT ----- Needs to keep appointment with nephrology

## 2016-08-25 NOTE — Telephone Encounter (Signed)
MA unable to leave a VM due to contact ringing continuously with no voicemail option.  !!!Please inform patient to keep his appointment with nephrology (kidney specialist)!!!

## 2016-08-31 ENCOUNTER — Other Ambulatory Visit (HOSPITAL_COMMUNITY): Payer: Self-pay | Admitting: Internal Medicine

## 2016-08-31 DIAGNOSIS — I10 Essential (primary) hypertension: Secondary | ICD-10-CM

## 2016-09-01 ENCOUNTER — Other Ambulatory Visit: Payer: Self-pay | Admitting: Family Medicine

## 2016-09-01 DIAGNOSIS — I10 Essential (primary) hypertension: Secondary | ICD-10-CM

## 2016-09-01 MED ORDER — LABETALOL HCL 200 MG PO TABS: 200.0000 mg | ORAL_TABLET | Freq: Two times a day (BID) | ORAL | 2 refills | Status: DC

## 2016-09-01 MED ORDER — LABETALOL HCL 200 MG PO TABS: 200.0000 mg | ORAL_TABLET | Freq: Two times a day (BID) | ORAL | 0 refills | Status: DC

## 2016-09-01 MED ORDER — AMLODIPINE BESYLATE 10 MG PO TABS: 10.0000 mg | ORAL_TABLET | Freq: Every day | ORAL | 0 refills | Status: DC

## 2016-09-01 MED ORDER — AMLODIPINE BESYLATE 10 MG PO TABS: 10.0000 mg | ORAL_TABLET | Freq: Every day | ORAL | 2 refills | Status: DC

## 2016-09-02 MED FILL — AMLODIPINE BESYLATE 10 MG T: 10 | 30 days supply | Qty: 30 | Fill #0

## 2016-09-02 MED FILL — LABETALOL HCL 200 MG TABLET: 200 | 30 days supply | Qty: 60 | Fill #0

## 2016-10-01 MED FILL — LABETALOL HCL 200 MG TABLET: 200 | 30 days supply | Qty: 60 | Fill #1

## 2016-10-01 MED FILL — AMLODIPINE BESYLATE 10 MG T: 10 | 30 days supply | Qty: 30 | Fill #1

## 2016-10-15 ENCOUNTER — Telehealth: Payer: Self-pay | Admitting: General Practice

## 2016-10-15 NOTE — Telephone Encounter (Signed)
Tried to reach patient to inform him that he has prescription ready for pick up. Pt needs to do so by 3:00pm on Monday 11/27.

## 2016-11-03 MED FILL — AMLODIPINE BESYLATE 10 MG T: 10 | 30 days supply | Qty: 30 | Fill #2

## 2016-11-03 MED FILL — LABETALOL HCL 200 MG TABLET: 200 | 30 days supply | Qty: 60 | Fill #2

## 2016-11-03 MED FILL — ?ISOSORBIDE MN ER 30 MG TAB: 30 | 30 days supply | Qty: 30 | Fill #1

## 2016-12-03 ENCOUNTER — Other Ambulatory Visit: Payer: Self-pay | Admitting: Family Medicine

## 2016-12-03 DIAGNOSIS — I10 Essential (primary) hypertension: Secondary | ICD-10-CM

## 2016-12-03 MED FILL — LABETALOL HCL 200 MG TABLET: 200 | 30 days supply | Qty: 60 | Fill #0

## 2016-12-03 MED FILL — ?ISOSORBIDE MN ER 30 MG TAB: 30 | 30 days supply | Qty: 30 | Fill #2

## 2016-12-03 MED FILL — AMLODIPINE BESYLATE 10 MG T: 10 | 30 days supply | Qty: 30 | Fill #0

## 2017-01-08 ENCOUNTER — Other Ambulatory Visit: Payer: Self-pay | Admitting: Family Medicine

## 2017-01-08 DIAGNOSIS — I10 Essential (primary) hypertension: Secondary | ICD-10-CM

## 2017-01-08 MED FILL — ISOSORBIDE MN ER 30 MG TAB: 30 | 30 days supply | Qty: 30 | Fill #3

## 2017-01-16 ENCOUNTER — Other Ambulatory Visit: Payer: Self-pay | Admitting: Family Medicine

## 2017-01-16 DIAGNOSIS — I10 Essential (primary) hypertension: Secondary | ICD-10-CM

## 2017-01-22 ENCOUNTER — Other Ambulatory Visit: Payer: Self-pay | Admitting: Family Medicine

## 2017-01-22 DIAGNOSIS — I10 Essential (primary) hypertension: Secondary | ICD-10-CM

## 2017-03-17 ENCOUNTER — Encounter (HOSPITAL_COMMUNITY): Payer: Self-pay | Admitting: Emergency Medicine

## 2017-03-17 ENCOUNTER — Emergency Department (HOSPITAL_COMMUNITY)
Admission: EM | Admit: 2017-03-17 | Discharge: 2017-03-17 | Disposition: A | Discharge status: Home / Self Care | Payer: Worker's Compensation | Attending: Emergency Medicine | Admitting: Emergency Medicine

## 2017-03-17 DIAGNOSIS — Y939 Activity, unspecified: Secondary | ICD-10-CM | POA: Diagnosis not present

## 2017-03-17 DIAGNOSIS — S0990XA Unspecified injury of head, initial encounter: Secondary | ICD-10-CM | POA: Diagnosis not present

## 2017-03-17 DIAGNOSIS — Z79899 Other long term (current) drug therapy: Secondary | ICD-10-CM | POA: Insufficient documentation

## 2017-03-17 DIAGNOSIS — I10 Essential (primary) hypertension: Secondary | ICD-10-CM

## 2017-03-17 DIAGNOSIS — Y999 Unspecified external cause status: Secondary | ICD-10-CM | POA: Insufficient documentation

## 2017-03-17 DIAGNOSIS — Z7982 Long term (current) use of aspirin: Secondary | ICD-10-CM | POA: Diagnosis not present

## 2017-03-17 DIAGNOSIS — Z8673 Personal history of transient ischemic attack (TIA), and cerebral infarction without residual deficits: Secondary | ICD-10-CM | POA: Insufficient documentation

## 2017-03-17 DIAGNOSIS — Z87891 Personal history of nicotine dependence: Secondary | ICD-10-CM | POA: Diagnosis not present

## 2017-03-17 DIAGNOSIS — Y92410 Unspecified street and highway as the place of occurrence of the external cause: Secondary | ICD-10-CM | POA: Diagnosis not present

## 2017-03-17 DIAGNOSIS — I129 Hypertensive chronic kidney disease with stage 1 through stage 4 chronic kidney disease, or unspecified chronic kidney disease: Secondary | ICD-10-CM | POA: Diagnosis not present

## 2017-03-17 DIAGNOSIS — N189 Chronic kidney disease, unspecified: Secondary | ICD-10-CM | POA: Diagnosis not present

## 2017-03-17 LAB — CBC
HCT: 55.1 % — ABNORMAL HIGH (ref 39.0–52.0)
Hemoglobin: 17.9 g/dL — ABNORMAL HIGH (ref 13.0–17.0)
MCH: 23.7 pg — ABNORMAL LOW (ref 26.0–34.0)
MCHC: 32.5 g/dL (ref 30.0–36.0)
MCV: 73 fL — ABNORMAL LOW (ref 78.0–100.0)
PLATELETS: 346 10*3/uL (ref 150–400)
RBC: 7.55 MIL/uL — AB (ref 4.22–5.81)
RDW: 17.4 % — ABNORMAL HIGH (ref 11.5–15.5)
WBC: 7.7 10*3/uL (ref 4.0–10.5)

## 2017-03-17 LAB — BASIC METABOLIC PANEL
ANION GAP: 10 (ref 5–15)
BUN: 27 mg/dL — ABNORMAL HIGH (ref 6–20)
CO2: 27 mmol/L (ref 22–32)
Calcium: 8.1 mg/dL — ABNORMAL LOW (ref 8.9–10.3)
Chloride: 101 mmol/L (ref 101–111)
Creatinine, Ser: 2.55 mg/dL — ABNORMAL HIGH (ref 0.61–1.24)
GFR, EST AFRICAN AMERICAN: 31 mL/min — AB (ref >60)
GFR, EST NON AFRICAN AMERICAN: 27 mL/min — AB (ref >60)
Glucose, Bld: 109 mg/dL — ABNORMAL HIGH (ref 65–99)
POTASSIUM: 6.2 mmol/L — AB (ref 3.5–5.1)
SODIUM: 138 mmol/L (ref 135–145)

## 2017-03-17 MED ORDER — ASPIRIN 81 MG PO TABS: 81.0000 mg | ORAL_TABLET | Freq: Every day | ORAL | 0 refills | Status: DC

## 2017-03-17 MED ORDER — AMLODIPINE BESYLATE 5 MG PO TABS
10.0000 mg | ORAL_TABLET | Freq: Once | ORAL | Status: AC
  Administered 2017-03-17: 10 mg via ORAL
  Filled 2017-03-17: qty 2

## 2017-03-17 MED ORDER — ISOSORBIDE MONONITRATE ER 30 MG PO TB24
30.0000 mg | ORAL_TABLET | Freq: Every day | ORAL | Status: DC
  Administered 2017-03-17: 30 mg via ORAL
  Filled 2017-03-17: qty 1

## 2017-03-17 MED ORDER — LABETALOL HCL 200 MG PO TABS
200.0000 mg | ORAL_TABLET | Freq: Once | ORAL | Status: AC
  Administered 2017-03-17: 200 mg via ORAL
  Filled 2017-03-17: qty 1

## 2017-03-17 MED ORDER — CLONIDINE HCL 0.2 MG PO TABS
0.2000 mg | ORAL_TABLET | Freq: Once | ORAL | Status: AC
  Administered 2017-03-17: 0.2 mg via ORAL
  Filled 2017-03-17: qty 1

## 2017-03-17 MED ORDER — LABETALOL HCL 200 MG PO TABS: 200.0000 mg | ORAL_TABLET | Freq: Two times a day (BID) | ORAL | 0 refills | Status: DC

## 2017-03-17 MED ORDER — AMLODIPINE BESYLATE 10 MG PO TABS: 10.0000 mg | ORAL_TABLET | Freq: Every day | ORAL | 0 refills | Status: DC

## 2017-03-17 MED ORDER — ISOSORBIDE MONONITRATE ER 30 MG PO TB24: 30.0000 mg | ORAL_TABLET | Freq: Every day | ORAL | 6 refills | Status: DC
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
## 2017-04-28 ENCOUNTER — Ambulatory Visit: Payer: Worker's Compensation | Admitting: Internal Medicine

## 2017-05-29 ENCOUNTER — Encounter: Payer: Self-pay | Admitting: Internal Medicine

## 2017-05-29 ENCOUNTER — Ambulatory Visit: Payer: Self-pay | Attending: Internal Medicine | Admitting: Internal Medicine

## 2017-05-29 VITALS — BP 158/100 | HR 65 | Temp 98.3°F | Resp 16 | Wt 196.0 lb

## 2017-05-29 DIAGNOSIS — Z8673 Personal history of transient ischemic attack (TIA), and cerebral infarction without residual deficits: Secondary | ICD-10-CM | POA: Insufficient documentation

## 2017-05-29 DIAGNOSIS — Z1211 Encounter for screening for malignant neoplasm of colon: Secondary | ICD-10-CM

## 2017-05-29 DIAGNOSIS — Z87891 Personal history of nicotine dependence: Secondary | ICD-10-CM | POA: Insufficient documentation

## 2017-05-29 DIAGNOSIS — N183 Chronic kidney disease, stage 3 unspecified: Secondary | ICD-10-CM

## 2017-05-29 DIAGNOSIS — Z823 Family history of stroke: Secondary | ICD-10-CM | POA: Insufficient documentation

## 2017-05-29 DIAGNOSIS — I129 Hypertensive chronic kidney disease with stage 1 through stage 4 chronic kidney disease, or unspecified chronic kidney disease: Secondary | ICD-10-CM | POA: Insufficient documentation

## 2017-05-29 DIAGNOSIS — Z7982 Long term (current) use of aspirin: Secondary | ICD-10-CM | POA: Insufficient documentation

## 2017-05-29 DIAGNOSIS — E876 Hypokalemia: Secondary | ICD-10-CM | POA: Insufficient documentation

## 2017-05-29 DIAGNOSIS — D582 Other hemoglobinopathies: Secondary | ICD-10-CM | POA: Insufficient documentation

## 2017-05-29 DIAGNOSIS — G459 Transient cerebral ischemic attack, unspecified: Secondary | ICD-10-CM | POA: Insufficient documentation

## 2017-05-29 DIAGNOSIS — Z8249 Family history of ischemic heart disease and other diseases of the circulatory system: Secondary | ICD-10-CM | POA: Insufficient documentation

## 2017-05-29 DIAGNOSIS — I10 Essential (primary) hypertension: Secondary | ICD-10-CM

## 2017-05-29 DIAGNOSIS — Z809 Family history of malignant neoplasm, unspecified: Secondary | ICD-10-CM | POA: Insufficient documentation

## 2017-05-29 DIAGNOSIS — R0683 Snoring: Secondary | ICD-10-CM | POA: Insufficient documentation

## 2017-05-29 MED ORDER — LABETALOL HCL 200 MG PO TABS
200.0000 mg | ORAL_TABLET | Freq: Two times a day (BID) | ORAL | 4 refills | Status: DC
Start: 2017-05-29 — End: 2017-11-18

## 2017-05-29 MED ORDER — AMLODIPINE BESYLATE 10 MG PO TABS: 10.0000 mg | ORAL_TABLET | Freq: Every day | ORAL | 6 refills | Status: DC

## 2017-05-29 MED ORDER — HYDROCHLOROTHIAZIDE 25 MG PO TABS: 25.0000 mg | ORAL_TABLET | Freq: Every day | ORAL | 4 refills | Status: DC

## 2017-05-29 MED ORDER — ISOSORBIDE MONONITRATE ER 30 MG PO TB24: 30.0000 mg | ORAL_TABLET | Freq: Every day | ORAL | 6 refills | Status: DC

## 2017-05-29 MED FILL — HYDROCHLOROTHIAZIDE 25 MG T: 25 | 30 days supply | Qty: 30 | Fill #0

## 2017-05-29 MED FILL — ?AMLODIPINE BESYLATE 10 MG: 10 | 30 days supply | Qty: 30 | Fill #0

## 2017-05-29 MED FILL — ISOSORBIDE MN ER 30 MG TAB: 30 | 30 days supply | Qty: 30 | Fill #0

## 2017-05-29 MED FILL — LABETALOL HCL 200 MG TABLET: 200 | 30 days supply | Qty: 60 | Fill #0

## 2017-05-29 NOTE — Progress Notes (Signed)
Pt states he took his bp around 12:51pm and it was 170/121 with a pulse of 62 Pt states he took his medicine at 1pm

## 2017-05-29 NOTE — Patient Instructions (Addendum)
Please give forms for [facility] card and [employer]. Please give appt with Ms. Luciana Axe.  Give appt with Stacy in 2 wks fr BP recheck.   Continue current blood pressure medications. New blood pressure medication added called HCTZ. After you've been on this medication for about a week please return to the lab for blood test..  He should avoid over-the-counter pain medications like ibuprofen, Aleve, Advil, Naprosyn, due to your kidney function.  Please let me know when and if you qualify for the [facility] card or [facility] discount so that we can schedule her colonoscopy and sleep study as discussed today.
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
## 2017-05-30 NOTE — Addendum Note (Signed)
Addended by: Karle Plumber B on: 05/30/2017 07:53 PM   Modules accepted: Orders

## 2017-06-23 MED FILL — ISOSORBIDE MN ER 30 MG TAB: 30 | 30 days supply | Qty: 30 | Fill #1

## 2017-06-23 MED FILL — HYDROCHLOROTHIAZIDE 25 MG T: 25 | 30 days supply | Qty: 30 | Fill #1

## 2017-06-23 MED FILL — LABETALOL HCL 200 MG TABLET: 200 | 30 days supply | Qty: 60 | Fill #1

## 2017-06-23 MED FILL — AMLODIPINE BESYLATE 10 MG T: 10 | 30 days supply | Qty: 30 | Fill #1

## 2017-06-29 ENCOUNTER — Ambulatory Visit: Payer: No Typology Code available for payment source | Attending: Internal Medicine

## 2017-06-29 DIAGNOSIS — I10 Essential (primary) hypertension: Secondary | ICD-10-CM

## 2017-06-29 DIAGNOSIS — D582 Other hemoglobinopathies: Secondary | ICD-10-CM

## 2017-06-30 ENCOUNTER — Telehealth: Payer: Self-pay | Admitting: Internal Medicine

## 2017-06-30 LAB — COMPREHENSIVE METABOLIC PANEL
ALBUMIN: 4.6 g/dL (ref 3.5–5.5)
ALK PHOS: 59 IU/L (ref 39–117)
ALT: 8 IU/L (ref 0–44)
AST: 12 IU/L (ref 0–40)
Albumin/Globulin Ratio: 1.6 (ref 1.2–2.2)
BILIRUBIN TOTAL: 0.7 mg/dL (ref 0.0–1.2)
BUN / CREAT RATIO: 12 (ref 9–20)
BUN: 42 mg/dL — AB (ref 6–24)
CHLORIDE: 97 mmol/L (ref 96–106)
CO2: 25 mmol/L (ref 20–29)
Calcium: 9.7 mg/dL (ref 8.7–10.2)
Creatinine, Ser: 3.55 mg/dL — ABNORMAL HIGH (ref 0.76–1.27)
GFR calc Af Amer: 21 mL/min/{1.73_m2} — ABNORMAL LOW (ref >59)
GFR calc non Af Amer: 18 mL/min/{1.73_m2} — ABNORMAL LOW (ref >59)
GLUCOSE: 86 mg/dL (ref 65–99)
Globulin, Total: 2.8 g/dL (ref 1.5–4.5)
Potassium: 4.4 mmol/L (ref 3.5–5.2)
Sodium: 140 mmol/L (ref 134–144)
Total Protein: 7.4 g/dL (ref 6.0–8.5)

## 2017-06-30 LAB — CBC
HEMATOCRIT: 52.3 % — AB (ref 37.5–51.0)
Hemoglobin: 16.9 g/dL (ref 13.0–17.7)
MCH: 24.4 pg — ABNORMAL LOW (ref 26.6–33.0)
MCHC: 32.3 g/dL (ref 31.5–35.7)
MCV: 75 fL — ABNORMAL LOW (ref 79–97)
Platelets: 440 10*3/uL — ABNORMAL HIGH (ref 150–379)
RBC: 6.94 x10E6/uL — AB (ref 4.14–5.80)
RDW: 15.6 % — AB (ref 12.3–15.4)
WBC: 5.9 10*3/uL (ref 3.4–10.8)

## 2017-06-30 LAB — ERYTHROPOIETIN: Erythropoietin: 1.4 m[IU]/mL — ABNORMAL LOW (ref 2.6–18.5)

## 2017-06-30 MED ORDER — HYDRALAZINE HCL 10 MG PO TABS: 10.0000 mg | ORAL_TABLET | Freq: Two times a day (BID) | ORAL | 1 refills | Status: DC

## 2017-06-30 NOTE — Telephone Encounter (Signed)
PC placed to pt this evening to go over lab results.  Pt informed that his kidney function has worsened.  I want him to d/c HCTZ and we will use low dose Hydralazine instead. Needs to see nephrologist.He has been checking BP 3 times a day.  Yesterdays readings were 139/99, 108/79 and 127/90.  This a.m was 144/90.   2.  Polycythemia: erythropoiten level was low suggesting PCV.  I recommend still getting sleep study and referral to hematologist.   Pt did not apply for [facility] discount as discussed on last visit.  Michela Pitcher he had a job interview today that looks promising.  If he gets it, he will have insurance.  Advise that he should still apply for [facility] discount in mean time.  He expressed understanding.    Pt to stop HCTZ.  Rxn for Hydralazine sent to [facility] clinic pharmacy

## 2017-07-01 MED FILL — hydrALAZINE HCL 10 MG TABS: 10 | 30 days supply | Qty: 60 | Fill #0

## 2017-07-13 ENCOUNTER — Ambulatory Visit: Payer: Self-pay | Admitting: Internal Medicine

## 2017-07-29 ENCOUNTER — Ambulatory Visit: Payer: Self-pay | Attending: Internal Medicine

## 2017-08-06 MED FILL — ISOSORBIDE MN ER 30 MG TAB: 30 | 30 days supply | Qty: 30 | Fill #2

## 2017-08-06 MED FILL — AMLODIPINE BESYLATE 10 MG T: 10 | 30 days supply | Qty: 30 | Fill #2

## 2017-08-06 MED FILL — LABETALOL HCL 200 MG TABLET: 200 | 30 days supply | Qty: 60 | Fill #2

## 2017-08-06 MED FILL — hydrALAZINE HCL 10 MG TABS: 10 | 30 days supply | Qty: 60 | Fill #1

## 2017-09-14 ENCOUNTER — Other Ambulatory Visit: Payer: Self-pay | Admitting: Internal Medicine

## 2017-09-14 MED FILL — LABETALOL HCL 200 MG TABLET: 200 | 30 days supply | Qty: 60 | Fill #3

## 2017-09-14 MED FILL — ISOSORBIDE MN ER 30 MG TAB: 30 | 30 days supply | Qty: 30 | Fill #3

## 2017-09-14 MED FILL — AMLODIPINE BESYLATE 10 MG T: 10 | 30 days supply | Qty: 30 | Fill #3

## 2017-09-14 MED FILL — hydrALAZINE HCL 10 MG TABS: 10 | 30 days supply | Qty: 60 | Fill #0

## 2017-10-19 MED FILL — LABETALOL HCL 200 MG TABLET: 200 | 30 days supply | Qty: 60 | Fill #4

## 2017-10-19 MED FILL — ISOSORBIDE MN ER 30 MG TAB: 30 | 30 days supply | Qty: 30 | Fill #4

## 2017-10-19 MED FILL — hydrALAZINE HCL 10 MG TABS: 10 | 30 days supply | Qty: 60 | Fill #1

## 2017-10-19 MED FILL — AMLODIPINE BESYLATE 10 MG T: 10 | 30 days supply | Qty: 30 | Fill #4

## 2017-10-28 ENCOUNTER — Ambulatory Visit: Payer: Self-pay | Attending: Internal Medicine

## 2017-11-18 ENCOUNTER — Other Ambulatory Visit: Payer: Self-pay | Admitting: Internal Medicine

## 2017-11-18 DIAGNOSIS — I10 Essential (primary) hypertension: Secondary | ICD-10-CM

## 2017-11-18 MED FILL — ?ISOSORBIDE MN 30 MG TAB SA: 30 | 30 days supply | Qty: 30 | Fill #5

## 2017-11-18 MED FILL — hydrALAZINE HCL 10 MG TABS: 10 | 30 days supply | Qty: 60 | Fill #2

## 2017-11-18 MED FILL — AMLODIPINE BESYLATE 10 MG T: 10 | 30 days supply | Qty: 30 | Fill #5

## 2017-11-18 MED FILL — LABETALOL HCL 200 MG TABLET: 200 | 30 days supply | Qty: 60 | Fill #0

## 2017-12-01 ENCOUNTER — Ambulatory Visit: Payer: Self-pay | Attending: Internal Medicine | Admitting: Internal Medicine

## 2017-12-01 ENCOUNTER — Encounter: Payer: Self-pay | Admitting: Internal Medicine

## 2017-12-01 VITALS — BP 190/121 | HR 72 | Temp 98.0°F | Resp 16 | Wt 189.6 lb

## 2017-12-01 DIAGNOSIS — Z87891 Personal history of nicotine dependence: Secondary | ICD-10-CM | POA: Insufficient documentation

## 2017-12-01 DIAGNOSIS — Z7982 Long term (current) use of aspirin: Secondary | ICD-10-CM | POA: Insufficient documentation

## 2017-12-01 DIAGNOSIS — Z8673 Personal history of transient ischemic attack (TIA), and cerebral infarction without residual deficits: Secondary | ICD-10-CM | POA: Insufficient documentation

## 2017-12-01 DIAGNOSIS — R0683 Snoring: Secondary | ICD-10-CM | POA: Insufficient documentation

## 2017-12-01 DIAGNOSIS — N184 Chronic kidney disease, stage 4 (severe): Secondary | ICD-10-CM | POA: Insufficient documentation

## 2017-12-01 DIAGNOSIS — R220 Localized swelling, mass and lump, head: Secondary | ICD-10-CM | POA: Insufficient documentation

## 2017-12-01 DIAGNOSIS — I10 Essential (primary) hypertension: Secondary | ICD-10-CM

## 2017-12-01 DIAGNOSIS — Z1211 Encounter for screening for malignant neoplasm of colon: Secondary | ICD-10-CM

## 2017-12-01 DIAGNOSIS — I129 Hypertensive chronic kidney disease with stage 1 through stage 4 chronic kidney disease, or unspecified chronic kidney disease: Secondary | ICD-10-CM | POA: Insufficient documentation

## 2017-12-01 MED ORDER — LABETALOL HCL 200 MG PO TABS: 200.0000 mg | ORAL_TABLET | Freq: Two times a day (BID) | ORAL | 6 refills | Status: DC

## 2017-12-01 MED ORDER — HYDRALAZINE HCL 25 MG PO TABS: 25.0000 mg | ORAL_TABLET | Freq: Three times a day (TID) | ORAL | 6 refills | Status: DC

## 2017-12-01 MED ORDER — ISOSORBIDE MONONITRATE ER 30 MG PO TB24: 30.0000 mg | ORAL_TABLET | Freq: Every day | ORAL | 6 refills | Status: DC

## 2017-12-01 MED ORDER — AMLODIPINE BESYLATE 10 MG PO TABS: 10.0000 mg | ORAL_TABLET | Freq: Every day | ORAL | 6 refills | Status: DC

## 2017-12-01 NOTE — Progress Notes (Signed)
Patient ID: Fred Harrison, male    DOB: May 06, 1966  MRN: [id number]  CC: Hypertension   Subjective: Fred Harrison is a 52 y.o. male who presents for chronic.  Last seen in July 2018. His concerns today include:  Pt with hx of HTN, TIA, CKD stage 4, former tob  1.  HTN: compliant with meds No device to check BP Limits salt No CP/SOB/LE edema/headaches  2. Tob dep: still free of cigarette  3.  Snoring: On last visit we discussed referral for sleep study but he did not have insurance.  He has been approved for [employer].  However he still wants to hold off on being referred for the sleep study.  4.  Complains of having "calcium build up on my scalp" x yrs -He is embarrassed by the cosmetic appearance and wears a At all times. -Requests dermatology referral to have it removed.   5. CKD stage 4: On last visit BMP revealed worsening kidney function.  I had recommended referral to nephrology but patient did not have insurance. -Still makes good urine. -No lower extremity edema. Patient Active Problem List   Diagnosis Date Noted  . Elevated hemoglobin ([place]) 08/13/2016  . Hypokalemia   . TIA (transient ischemic attack) 08/03/2016  . CKD (chronic kidney disease) 08/03/2016  . HTN (hypertension) 08/03/2016     Current Outpatient Medications on File Prior to Visit  Medication Sig Dispense Refill  . aspirin 81 MG tablet Take 1 tablet (81 mg total) by mouth daily. 30 tablet 0   No current facility-administered medications on file prior to visit.     No Known Allergies  Social History   Socioeconomic History  . Marital status: Divorced    Spouse name: Not on file  . Number of children: Not on file  . Years of education: Not on file  . Highest education level: Not on file  Social Needs  . Financial resource strain: Not on file  . Food insecurity - worry: Not on file  . Food insecurity - inability: Not on file  . Transportation needs - medical: Not on file  .  Transportation needs - non-medical: Not on file  Occupational History  . Not on file  Tobacco Use  . Smoking status: Former Smoker    Types: Cigarettes    Last attempt to quit: 07/02/2016    Years since quitting: 1.4  . Smokeless tobacco: Never Used  Substance and Sexual Activity  . Alcohol use: No  . Drug use: No  . Sexual activity: Not on file  Other Topics Concern  . Not on file  Social History Narrative  . Not on file    Family History  Problem Relation Age of Onset  . Stroke Mother   . Hypertension Mother   . Heart attack Father   . Cancer Sister     No past surgical history on file.  ROS: Review of Systems Negative except as stated above. PHYSICAL EXAM: BP (!) 190/121   Pulse 72   Temp 98 F (36.7 C) (Oral)   Resp 16   Wt 189 lb 9.6 oz (86 kg)   SpO2 96%   BMI 29.92 kg/m   Physical Exam  General appearance - alert, well appearing, and in no distress Mental status - alert, oriented to person, place, and time, normal mood, behavior, speech, dress, motor activity, and thought processes Chest - clear to auscultation, no wheezes, rales or rhonchi, symmetric air entry Heart - normal rate, regular rhythm,  normal S1, S2, no murmurs, rubs, clicks or gallops Extremities - peripheral pulses normal, no pedal edema, no clubbing or cyanosis Skin - egg size soft mass on anterior scalp  ASSESSMENT AND PLAN: 1. Essential Malignant Not at goal Increase hydralazine to 25 mg 3 times a day. Follow-up with clinical pharmacist in 2 weeks for repeat blood pressure check.  If still elevated please increase hydralazine if he is tolerating the medication. - Basic Metabolic Panel; Future - hydrALAZINE (APRESOLINE) 25 MG tablet; Take 1 tablet (25 mg total) by mouth 3 (three) times daily.  Dispense: 90 tablet; Refill: 6 - labetalol (NORMODYNE) 200 MG tablet; Take 1 tablet (200 mg total) by mouth 2 (two) times daily.  Dispense: 60 tablet; Refill: 6 - amLODipine (NORVASC) 10 MG tablet;  Take 1 tablet (10 mg total) by mouth daily.  Dispense: 30 tablet; Refill: 6 - isosorbide mononitrate (IMDUR) 30 MG 24 hr tablet; Take 1 tablet (30 mg total) by mouth daily.  Dispense: 30 tablet; Refill: 6  2. CKD stage 4 secondary to hypertension [facility]) - Ambulatory referral to Nephrology  3. Colon cancer screening Patient does not want to have colonoscopy. I forgot to give him the FIT test today.  Will give on follow-up visit.  4. Scalp mass - Ambulatory referral to Dermatology  5. Snores Patient declines referral for sleep study.  Patient was given the opportunity to ask questions.  Patient verbalized understanding of the plan and was able to repeat key elements of the plan.   Orders Placed This Encounter  Procedures  . Basic Metabolic Panel  . Ambulatory referral to Dermatology  . Ambulatory referral to Nephrology     Requested Prescriptions   Signed Prescriptions Disp Refills  . hydrALAZINE (APRESOLINE) 25 MG tablet 90 tablet 6    Sig: Take 1 tablet (25 mg total) by mouth 3 (three) times daily.  Marland Kitchen labetalol (NORMODYNE) 200 MG tablet 60 tablet 6    Sig: Take 1 tablet (200 mg total) by mouth 2 (two) times daily.  Marland Kitchen amLODipine (NORVASC) 10 MG tablet 30 tablet 6    Sig: Take 1 tablet (10 mg total) by mouth daily.  . isosorbide mononitrate (IMDUR) 30 MG 24 hr tablet 30 tablet 6    Sig: Take 1 tablet (30 mg total) by mouth daily.    Return in about 2 weeks (around 12/15/2017) for BP check.  Karle Plumber, MD, FACP

## 2017-12-02 ENCOUNTER — Other Ambulatory Visit: Payer: Self-pay

## 2017-12-02 MED FILL — ?HYDRALAZINE 25MG TAB: 25 | 30 days supply | Qty: 90 | Fill #0

## 2017-12-15 ENCOUNTER — Ambulatory Visit: Payer: Self-pay | Admitting: Pharmacist

## 2017-12-18 MED FILL — ?AMLODIPINE BESYLATE 10MG T: 10 | 30 days supply | Qty: 30 | Fill #6

## 2017-12-18 MED FILL — ?ISOSORBIDE MN 30 MG TAB SA: 30 | 30 days supply | Qty: 30 | Fill #6

## 2017-12-18 MED FILL — LABETALOL HCL 200 MG TABLET: 200 | 30 days supply | Qty: 60 | Fill #0

## 2018-01-05 ENCOUNTER — Ambulatory Visit: Payer: Self-pay | Attending: Internal Medicine | Admitting: Internal Medicine

## 2018-01-05 ENCOUNTER — Encounter: Payer: Self-pay | Admitting: Internal Medicine

## 2018-01-05 VITALS — BP 165/105 | HR 73 | Temp 98.3°F | Resp 16 | Ht 66.0 in | Wt 197.2 lb

## 2018-01-05 DIAGNOSIS — Z79899 Other long term (current) drug therapy: Secondary | ICD-10-CM | POA: Insufficient documentation

## 2018-01-05 DIAGNOSIS — I129 Hypertensive chronic kidney disease with stage 1 through stage 4 chronic kidney disease, or unspecified chronic kidney disease: Secondary | ICD-10-CM | POA: Insufficient documentation

## 2018-01-05 DIAGNOSIS — Z23 Encounter for immunization: Secondary | ICD-10-CM | POA: Insufficient documentation

## 2018-01-05 DIAGNOSIS — Z87891 Personal history of nicotine dependence: Secondary | ICD-10-CM | POA: Insufficient documentation

## 2018-01-05 DIAGNOSIS — N184 Chronic kidney disease, stage 4 (severe): Secondary | ICD-10-CM | POA: Insufficient documentation

## 2018-01-05 DIAGNOSIS — Z2821 Immunization not carried out because of patient refusal: Secondary | ICD-10-CM

## 2018-01-05 DIAGNOSIS — I10 Essential (primary) hypertension: Secondary | ICD-10-CM

## 2018-01-05 DIAGNOSIS — Z1211 Encounter for screening for malignant neoplasm of colon: Secondary | ICD-10-CM

## 2018-01-05 DIAGNOSIS — R220 Localized swelling, mass and lump, head: Secondary | ICD-10-CM | POA: Insufficient documentation

## 2018-01-05 DIAGNOSIS — Z7982 Long term (current) use of aspirin: Secondary | ICD-10-CM | POA: Insufficient documentation

## 2018-01-05 DIAGNOSIS — Z8673 Personal history of transient ischemic attack (TIA), and cerebral infarction without residual deficits: Secondary | ICD-10-CM | POA: Insufficient documentation

## 2018-01-05 MED ORDER — TETANUS-DIPHTH-ACELL PERTUSSIS 5-2.5-18.5 LF-MCG/0.5 IM SUSP: 0.5000 mL | Freq: Once | INTRAMUSCULAR | 0 refills | Status: AC

## 2018-01-05 MED ORDER — HYDRALAZINE HCL 50 MG PO TABS: 50.0000 mg | ORAL_TABLET | Freq: Three times a day (TID) | ORAL | 3 refills | Status: DC

## 2018-01-05 MED FILL — ?HYDRALAZINE 50MG TABLET: 50 | 30 days supply | Qty: 90 | Fill #0

## 2018-01-05 NOTE — Patient Instructions (Signed)
Please give 2 week appointment with [facility] for Repeat BP check DASH Eating Plan DASH stands for "Dietary Approaches to Stop Hypertension." The DASH eating plan is a healthy eating plan that has been shown to reduce high blood pressure (hypertension). It may also reduce your risk for type 2 diabetes, heart disease, and stroke. The DASH eating plan may also help with weight loss. What are tips for following this plan? General guidelines  Avoid eating more than 2,300 mg (milligrams) of salt (sodium) a day. If you have hypertension, you may need to reduce your sodium intake to 1,500 mg a day.  Limit alcohol intake to no more than 1 drink a day for nonpregnant women and 2 drinks a day for men. One drink equals 12 oz of beer, 5 oz of wine, or 1 oz of hard liquor.  Work with your health care provider to maintain a healthy body weight or to lose weight. Ask what an ideal weight is for you.  Get at least 30 minutes of exercise that causes your heart to beat faster (aerobic exercise) most days of the week. Activities may include walking, swimming, or biking.  Work with your health care provider or diet and nutrition specialist (dietitian) to adjust your eating plan to your individual calorie needs. Reading food labels  Check food labels for the amount of sodium per serving. Choose foods with less than 5 percent of the Daily Value of sodium. Generally, foods with less than 300 mg of sodium per serving fit into this eating plan.  To find whole grains, look for the word "whole" as the first word in the ingredient list. Shopping  Buy products labeled as "low-sodium" or "no salt added."  Buy fresh foods. Avoid canned foods and premade or frozen meals. Cooking  Avoid adding salt when cooking. Use salt-free seasonings or herbs instead of table salt or sea salt. Check with your health care provider or pharmacist before using salt substitutes.  Do not fry foods. Cook foods using healthy methods such as  baking, boiling, grilling, and broiling instead.  Cook with heart-healthy oils, such as olive, canola, soybean, or sunflower oil. Meal planning   Eat a balanced diet that includes: ? 5 or more servings of fruits and vegetables each day. At each meal, try to fill half of your plate with fruits and vegetables. ? Up to 6-8 servings of whole grains each day. ? Less than 6 oz of lean meat, poultry, or fish each day. A 3-oz serving of meat is about the same size as a deck of cards. One egg equals 1 oz. ? 2 servings of low-fat dairy each day. ? A serving of nuts, seeds, or beans 5 times each week. ? Heart-healthy fats. Healthy fats called Omega-3 fatty acids are found in foods such as flaxseeds and coldwater fish, like sardines, salmon, and mackerel.  Limit how much you eat of the following: ? Canned or prepackaged foods. ? Food that is high in trans fat, such as fried foods. ? Food that is high in saturated fat, such as fatty meat. ? Sweets, desserts, sugary drinks, and other foods with added sugar. ? Full-fat dairy products.  Do not salt foods before eating.  Try to eat at least 2 vegetarian meals each week.  Eat more home-cooked food and less restaurant, buffet, and fast food.  When eating at a restaurant, ask that your food be prepared with less salt or no salt, if possible. What foods are recommended? The items listed may  not be a complete list. Talk with your dietitian about what dietary choices are best for you. Grains Whole-grain or whole-wheat bread. Whole-grain or whole-wheat pasta. Brown rice. [place]. Bulgur. Whole-grain and low-sodium cereals. Pita bread. Low-fat, low-sodium crackers. Whole-wheat flour tortillas. Vegetables Fresh or frozen vegetables (raw, steamed, roasted, or grilled). Low-sodium or reduced-sodium tomato and vegetable juice. Low-sodium or reduced-sodium tomato sauce and tomato paste. Low-sodium or reduced-sodium canned vegetables. Fruits All fresh,  dried, or frozen fruit. Canned fruit in natural juice (without added sugar). Meat and other protein foods Skinless chicken or [place]. Ground chicken or [place]. Pork with fat trimmed off. Fish and seafood. Egg whites. Dried beans, peas, or lentils. Unsalted nuts, nut butters, and seeds. Unsalted canned beans. Lean cuts of beef with fat trimmed off. Low-sodium, lean deli meat. Dairy Low-fat (1%) or fat-free (skim) milk. Fat-free, low-fat, or reduced-fat cheeses. Nonfat, low-sodium ricotta or cottage cheese. Low-fat or nonfat yogurt. Low-fat, low-sodium cheese. Fats and oils Soft margarine without trans fats. Vegetable oil. Low-fat, reduced-fat, or light mayonnaise and salad dressings (reduced-sodium). Canola, safflower, olive, soybean, and sunflower oils. Avocado. Seasoning and other foods Herbs. Spices. Seasoning mixes without salt. Unsalted popcorn and pretzels. Fat-free sweets. What foods are not recommended? The items listed may not be a complete list. Talk with your dietitian about what dietary choices are best for you. Grains Baked goods made with fat, such as croissants, muffins, or some breads. Dry pasta or rice meal packs. Vegetables Creamed or fried vegetables. Vegetables in a cheese sauce. Regular canned vegetables (not low-sodium or reduced-sodium). Regular canned tomato sauce and paste (not low-sodium or reduced-sodium). Regular tomato and vegetable juice (not low-sodium or reduced-sodium). Angie Fava. Olives. Fruits Canned fruit in a light or heavy syrup. Fried fruit. Fruit in cream or butter sauce. Meat and other protein foods Fatty cuts of meat. Ribs. Fried meat. Berniece Salines. Sausage. Bologna and other processed lunch meats. Salami. Fatback. Hotdogs. Bratwurst. Salted nuts and seeds. Canned beans with added salt. Canned or smoked fish. Whole eggs or egg yolks. Chicken or [place] with skin. Dairy Whole or 2% milk, cream, and half-and-half. Whole or full-fat cream cheese. Whole-fat or sweetened  yogurt. Full-fat cheese. Nondairy creamers. Whipped toppings. Processed cheese and cheese spreads. Fats and oils Butter. Stick margarine. Lard. Shortening. Ghee. Bacon fat. Tropical oils, such as coconut, palm kernel, or palm oil. Seasoning and other foods Salted popcorn and pretzels. Onion salt, garlic salt, seasoned salt, table salt, and sea salt. Worcestershire sauce. Tartar sauce. Barbecue sauce. Teriyaki sauce. Soy sauce, including reduced-sodium. Steak sauce. Canned and packaged gravies. Fish sauce. Oyster sauce. Cocktail sauce. Horseradish that you find on the shelf. Ketchup. Mustard. Meat flavorings and tenderizers. Bouillon cubes. Hot sauce and Tabasco sauce. Premade or packaged marinades. Premade or packaged taco seasonings. Relishes. Regular salad dressings. Where to find more information:  National Heart, Lung, and [place]: https://wilson-eaton.com/  American Heart Association: www.heart.org Summary  The DASH eating plan is a healthy eating plan that has been shown to reduce high blood pressure (hypertension). It may also reduce your risk for type 2 diabetes, heart disease, and stroke.  With the DASH eating plan, you should limit salt (sodium) intake to 2,300 mg a day. If you have hypertension, you may need to reduce your sodium intake to 1,500 mg a day.  When on the DASH eating plan, aim to eat more fresh fruits and vegetables, whole grains, lean proteins, low-fat dairy, and heart-healthy fats.  Work with your health care provider or diet and  nutrition specialist (dietitian) to adjust your eating plan to your individual calorie needs. This information is not intended to replace advice given to you by your health care provider. Make sure you discuss any questions you have with your health care provider. Document Released: 10/30/2011 Document Revised: 11/03/2016 Document Reviewed: 11/03/2016 Elsevier Interactive Patient Education  2018 [employer]. Hypertension Hypertension is  another name for high blood pressure. High blood pressure forces your heart to work harder to pump blood. This can cause problems over time. There are two numbers in a blood pressure reading. There is a top number (systolic) over a bottom number (diastolic). It is best to have a blood pressure below 120/80. Healthy choices can help lower your blood pressure. You may need medicine to help lower your blood pressure if:  Your blood pressure cannot be lowered with healthy choices.  Your blood pressure is higher than 130/80.  Follow these instructions at home: Eating and drinking  If directed, follow the DASH eating plan. This diet includes: ? Filling half of your plate at each meal with fruits and vegetables. ? Filling one quarter of your plate at each meal with whole grains. Whole grains include whole wheat pasta, brown rice, and whole grain bread. ? Eating or drinking low-fat dairy products, such as skim milk or low-fat yogurt. ? Filling one quarter of your plate at each meal with low-fat (lean) proteins. Low-fat proteins include fish, skinless chicken, eggs, beans, and tofu. ? Avoiding fatty meat, cured and processed meat, or chicken with skin. ? Avoiding premade or processed food.  Eat less than 1,500 mg of salt (sodium) a day.  Limit alcohol use to no more than 1 drink a day for nonpregnant women and 2 drinks a day for men. One drink equals 12 oz of beer, 5 oz of wine, or 1 oz of hard liquor. Lifestyle  Work with your doctor to stay at a healthy weight or to lose weight. Ask your doctor what the best weight is for you.  Get at least 30 minutes of exercise that causes your heart to beat faster (aerobic exercise) most days of the week. This may include walking, swimming, or biking.  Get at least 30 minutes of exercise that strengthens your muscles (resistance exercise) at least 3 days a week. This may include lifting weights or pilates.  Do not use any products that contain nicotine or  tobacco. This includes cigarettes and e-cigarettes. If you need help quitting, ask your doctor.  Check your blood pressure at home as told by your doctor.  Keep all follow-up visits as told by your doctor. This is important. Medicines  Take over-the-counter and prescription medicines only as told by your doctor. Follow directions carefully.  Do not skip doses of blood pressure medicine. The medicine does not work as well if you skip doses. Skipping doses also puts you at risk for problems.  Ask your doctor about side effects or reactions to medicines that you should watch for. Contact a doctor if:  You think you are having a reaction to the medicine you are taking.  You have headaches that keep coming back (recurring).  You feel dizzy.  You have swelling in your ankles.  You have trouble with your vision. Get help right away if:  You get a very bad headache.  You start to feel confused.  You feel weak or numb.  You feel faint.  You get very bad pain in your: ? Chest. ? Belly (abdomen).  You throw  up (vomit) more than once.  You have trouble breathing. Summary  Hypertension is another name for high blood pressure.  Making healthy choices can help lower blood pressure. If your blood pressure cannot be controlled with healthy choices, you may need to take medicine. This information is not intended to replace advice given to you by your health care provider. Make sure you discuss any questions you have with your health care provider. Document Released: 04/28/2008 Document Revised: 10/08/2016 Document Reviewed: 10/08/2016 Elsevier Interactive Patient Education  2018 [employer]. Tdap Vaccine (Tetanus, Diphtheria and Pertussis): What You Need to Know 1. Why get vaccinated? Tetanus, diphtheria and pertussis are very serious diseases. Tdap vaccine can protect [place] from these diseases. And, Tdap vaccine given to pregnant women can protect newborn babies against  pertussis. TETANUS (Lockjaw) is rare in the [place] today. It causes painful muscle tightening and stiffness, usually all over the body.  It can lead to tightening of muscles in the head and neck so you can't open your mouth, swallow, or sometimes even breathe. Tetanus kills about 1 out of 10 people who are infected even after receiving the best medical care.  DIPHTHERIA is also rare in the [place] today. It can cause a thick coating to form in the back of the throat.  It can lead to breathing problems, heart failure, paralysis, and death.  PERTUSSIS (Whooping Cough) causes severe coughing spells, which can cause difficulty breathing, vomiting and disturbed sleep.  It can also lead to weight loss, incontinence, and rib fractures. Up to 2 in 100 adolescents and 5 in 100 adults with pertussis are hospitalized or have complications, which could include pneumonia or death.  These diseases are caused by bacteria. Diphtheria and pertussis are spread from person to person through secretions from coughing or sneezing. Tetanus enters the body through cuts, scratches, or wounds. Before vaccines, as many as 200,000 cases of diphtheria, 200,000 cases of pertussis, and hundreds of cases of tetanus, were reported in the [place] each year. Since vaccination began, reports of cases for tetanus and diphtheria have dropped by about 99% and for pertussis by about 80%. 2. Tdap vaccine Tdap vaccine can protect adolescents and adults from tetanus, diphtheria, and pertussis. One dose of Tdap is routinely given at age 44 or 93. People who did not get Tdap at that age should get it as soon as possible. Tdap is especially important for healthcare professionals and anyone having close contact with a baby younger than 12 months. Pregnant women should get a dose of Tdap during every pregnancy, to protect the newborn from pertussis. Infants are most at risk for severe, life-threatening complications from  pertussis. Another vaccine, called Td, protects against tetanus and diphtheria, but not pertussis. A Td booster should be given every 10 years. Tdap may be given as one of these boosters if you have never gotten Tdap before. Tdap may also be given after a severe cut or burn to prevent tetanus infection. Your doctor or the person giving you the vaccine can give you more information. Tdap may safely be given at the same time as other vaccines. 3. Some people should not get this vaccine  A person who has ever had a life-threatening allergic reaction after a previous dose of any diphtheria, tetanus or pertussis containing vaccine, OR has a severe allergy to any part of this vaccine, should not get Tdap vaccine. Tell the person giving the vaccine about any severe allergies.  Anyone who had coma or long  repeated seizures within 7 days after a childhood dose of DTP or DTaP, or a previous dose of Tdap, should not get Tdap, unless a cause other than the vaccine was found. They can still get Td.  Talk to your doctor if you: ? have seizures or another nervous system problem, ? had severe pain or swelling after any vaccine containing diphtheria, tetanus or pertussis, ? ever had a condition called Guillain-Barr Syndrome (GBS), ? aren't feeling well on the day the shot is scheduled. 4. Risks With any medicine, including vaccines, there is a chance of side effects. These are usually mild and go away on their own. Serious reactions are also possible but are rare. Most people who get Tdap vaccine do not have any problems with it. Mild problems following Tdap: (Did not interfere with activities)  Pain where the shot was given (about 3 in 4 adolescents or 2 in 3 adults)  Redness or swelling where the shot was given (about 1 person in 5)  Mild fever of at least 100.66F (up to about 1 in 25 adolescents or 1 in 100 adults)  Headache (about 3 or 4 people in 10)  Tiredness (about 1 person in 3 or 4)  Nausea,  vomiting, diarrhea, stomach ache (up to 1 in 4 adolescents or 1 in 10 adults)  Chills, sore joints (about 1 person in 10)  Body aches (about 1 person in 3 or 4)  Rash, swollen glands (uncommon)  Moderate problems following Tdap: (Interfered with activities, but did not require medical attention)  Pain where the shot was given (up to 1 in 5 or 6)  Redness or swelling where the shot was given (up to about 1 in 16 adolescents or 1 in 12 adults)  Fever over 102F (about 1 in 100 adolescents or 1 in 250 adults)  Headache (about 1 in 7 adolescents or 1 in 10 adults)  Nausea, vomiting, diarrhea, stomach ache (up to 1 or 3 people in 100)  Swelling of the entire arm where the shot was given (up to about 1 in 500).  Severe problems following Tdap: (Unable to perform usual activities; required medical attention)  Swelling, severe pain, bleeding and redness in the arm where the shot was given (rare).  Problems that could happen after any vaccine:  People sometimes faint after a medical procedure, including vaccination. Sitting or lying down for about 15 minutes can help prevent fainting, and injuries caused by a fall. Tell your doctor if you feel dizzy, or have vision changes or ringing in the ears.  Some people get severe pain in the shoulder and have difficulty moving the arm where a shot was given. This happens very rarely.  Any medication can cause a severe allergic reaction. Such reactions from a vaccine are very rare, estimated at fewer than 1 in a million doses, and would happen within a few minutes to a few hours after the vaccination. As with any medicine, there is a very remote chance of a vaccine causing a serious injury or death. The safety of vaccines is always being monitored. For more information, visit: http://www.aguilar.org/ 5. What if there is a serious problem? What should I look for? Look for anything that concerns you, such as signs of a severe allergic reaction,  very high fever, or unusual behavior. Signs of a severe allergic reaction can include hives, swelling of the face and throat, difficulty breathing, a fast heartbeat, dizziness, and weakness. These would usually start a few minutes to a few  hours after the vaccination. What should I do?  If you think it is a severe allergic reaction or other emergency that can't wait, call 9-1-1 or get the person to the nearest hospital. Otherwise, call your doctor.  Afterward, the reaction should be reported to the Vaccine Adverse Event Reporting System (VAERS). Your doctor might file this report, or you can do it yourself through the VAERS web site at www.vaers.SamedayNews.es, or by calling 7822776569. ? VAERS does not give medical advice. 6. The National Vaccine Injury Compensation Program The [employer] Vaccine Injury Compensation Program (VICP) is a federal program that was created to compensate people who may have been injured by certain vaccines. Persons who believe they may have been injured by a vaccine can learn about the program and about filing a claim by calling 712-710-2004 or visiting the [place] website at GoldCloset.com.ee. There is a time limit to file a claim for compensation. 7. How can I learn more?  Ask your doctor. He or she can give you the vaccine package insert or suggest other sources of information.  Call your local or state health department.  Contact the Centers for Disease Control and Prevention (CDC): ? Call (815)352-5146 (1-800-CDC-INFO) or ? Visit CDC's website at http://hunter.com/ CDC Tdap Vaccine VIS (01/17/14) This information is not intended to replace advice given to you by your health care provider. Make sure you discuss any questions you have with your health care provider. Document Released: 05/11/2012 Document Revised: 07/31/2016 Document Reviewed: 07/31/2016 Elsevier Interactive Patient Education  2017 [employer].

## 2018-01-05 NOTE — Progress Notes (Signed)
Subjective:  Patient ID: Fred Harrison, male    DOB: 02/24/1966  Age: 52 y.o. MRN: [id number]  CC: Hypertension  HPI Fred Harrison is a 52 year old male with a PMH of HTN and CKD that presents today for follow-up.  1.HTN: Reports compliance with medications. Does not check BP at home because his BP device no longer works. He tries to limit salt intake. Denies Chest pain, SOB, edema, or headaches  2.CKD Stage 4 Denies decrease or changes in urination, hematuria, or flank pain. He states that he voids alot. Has not received a phone call regarding follow-up for Nephrology.    3. Mass on Scalp Denies pain or discharge from the mass. States that both his brother and sister had this and got it removed. Still awaiting follow-up from dermatology.  HM: Former smoker, continues to avoid tobacco use. He still wants to do FIT testing for colon cancer screen.  He is agreeable to receiving the Tdap today but declines the flu shot.  Past Medical History:  Diagnosis Date  . Hypertension   . TIA (transient ischemic attack)    No past surgical history on file. No Known Allergies   Outpatient Medications Prior to Visit  Medication Sig Dispense Refill  . amLODipine (NORVASC) 10 MG tablet Take 1 tablet (10 mg total) by mouth daily. 30 tablet 6  . aspirin 81 MG tablet Take 1 tablet (81 mg total) by mouth daily. 30 tablet 0  . isosorbide mononitrate (IMDUR) 30 MG 24 hr tablet Take 1 tablet (30 mg total) by mouth daily. 30 tablet 6  . labetalol (NORMODYNE) 200 MG tablet Take 1 tablet (200 mg total) by mouth 2 (two) times daily. 60 tablet 6  . hydrALAZINE (APRESOLINE) 25 MG tablet Take 1 tablet (25 mg total) by mouth 3 (three) times daily. 90 tablet 6   No facility-administered medications prior to visit.     ROS Review of Systems  Constitutional: Negative.   Respiratory: Negative for cough and shortness of breath.   Cardiovascular: Negative for chest pain.  Gastrointestinal: Negative.     Genitourinary: Negative for decreased urine volume, difficulty urinating, flank pain and hematuria.  Neurological: Negative for headaches.    Objective:  BP (!) 165/105 (BP Location: Left Arm)   Pulse 73   Temp 98.3 F (36.8 C) (Oral)   Resp 16   Ht 5\' 6"  (1.676 m)   Wt 197 lb 3.2 oz (89.4 kg)   SpO2 95%   BMI 31.83 kg/m   BP/Weight 01/05/2018 05/27/813 03/02/1855  Systolic BP 314 970 263  Diastolic BP 785 885 027  Wt. (Lbs) 197.2 189.6 196  BMI 31.83 29.92 30.93   Results for orders placed or performed in visit on 06/29/17  Comprehensive metabolic panel  Result Value Ref Range   Glucose 86 65 - 99 mg/dL   BUN 42 (H) 6 - 24 mg/dL   Creatinine, Ser 3.55 (H) 0.76 - 1.27 mg/dL   GFR calc non Af Amer 18 (L) >59 mL/min/1.73   GFR calc Af Amer 21 (L) >59 mL/min/1.73   BUN/Creatinine Ratio 12 9 - 20   Sodium 140 134 - 144 mmol/L   Potassium 4.4 3.5 - 5.2 mmol/L   Chloride 97 96 - 106 mmol/L   CO2 25 20 - 29 mmol/L   Calcium 9.7 8.7 - 10.2 mg/dL   Total Protein 7.4 6.0 - 8.5 g/dL   Albumin 4.6 3.5 - 5.5 g/dL   Globulin, Total 2.8 1.5 - 4.5  g/dL   Albumin/Globulin Ratio 1.6 1.2 - 2.2   Bilirubin Total 0.7 0.0 - 1.2 mg/dL   Alkaline Phosphatase 59 39 - 117 IU/L   AST 12 0 - 40 IU/L   ALT 8 0 - 44 IU/L  CBC  Result Value Ref Range   WBC 5.9 3.4 - 10.8 x10E3/uL   RBC 6.94 (H) 4.14 - 5.80 x10E6/uL   Hemoglobin 16.9 13.0 - 17.7 g/dL   Hematocrit 52.3 (H) 37.5 - 51.0 %   MCV 75 (L) 79 - 97 fL   MCH 24.4 (L) 26.6 - 33.0 pg   MCHC 32.3 31.5 - 35.7 g/dL   RDW 15.6 (H) 12.3 - 15.4 %   Platelets 440 (H) 150 - 379 x10E3/uL  Erythropoietin  Result Value Ref Range   Erythropoietin 1.4 (L) 2.6 - 18.5 mIU/mL   Physical Exam  Constitutional: He is oriented to person, place, and time. He appears well-developed and well-nourished. No distress.  HENT:  Large egg-shaped mass at the top of his head. Small mass noted on the right lateral aspect of his head.  Neck: Normal range of motion.  Neck supple.  Cardiovascular: Normal rate, regular rhythm and normal heart sounds.  Pulmonary/Chest: Effort normal and breath sounds normal.  Abdominal: Soft. Bowel sounds are normal. There is no tenderness.  Musculoskeletal: He exhibits no edema.  Neurological: He is alert and oriented to person, place, and time.  Skin: Skin is warm and dry.  Psychiatric: He has a normal mood and affect. His behavior is normal.    Assessment & Plan:   1. CKD stage 4 secondary to hypertension [facility]) Await Nephrology referral. Kidney ultrasound ordered.   - US Renal; Future  2. Malignant hypertension Uncontrolled: BP was 190/121 during last visit. It has decreased to 165/105 but still uncontrolled. Increased Hydralazine to 50mg  TID. Continue to limit salt intake.  Follow up with clinical pharmacist for BP re-check in 2 weeks.  - Basic Metabolic Panel - hydrALAZINE (APRESOLINE) 50 MG tablet; Take 1 tablet (50 mg total) by mouth 3 (three) times daily.  Dispense: 180 tablet; Refill: 3  3. Scalp mass Awaiting dermatology referral.   4. Need for Tdap vaccination Given during this visit. - Tdap vaccine greater than or equal to 7yo IM  5. Screen for colon cancer - Fecal occult blood, imunochemical([facility])  6. Former smoker Continue to avoid tobacco use.   7. Influenza vaccination declined Discussed importance of the influenza vaccine and prevention of flu, patient declined at this time.   Meds ordered this encounter  Medications  . hydrALAZINE (APRESOLINE) 50 MG tablet    Sig: Take 1 tablet (50 mg total) by mouth 3 (three) times daily.    Dispense:  180 tablet    Refill:  3    Follow-up: No Follow-up on file.   Jonnie Kind DNP student

## 2018-01-06 LAB — BASIC METABOLIC PANEL
BUN/Creatinine Ratio: 14 (ref 9–20)
BUN: 39 mg/dL — AB (ref 6–24)
CALCIUM: 9.1 mg/dL (ref 8.7–10.2)
CO2: 21 mmol/L (ref 20–29)
CREATININE: 2.7 mg/dL — AB (ref 0.76–1.27)
Chloride: 101 mmol/L (ref 96–106)
GFR calc Af Amer: 30 mL/min/{1.73_m2} — ABNORMAL LOW (ref >59)
GFR calc non Af Amer: 26 mL/min/{1.73_m2} — ABNORMAL LOW (ref >59)
GLUCOSE: 100 mg/dL — AB (ref 65–99)
Potassium: 4.6 mmol/L (ref 3.5–5.2)
Sodium: 139 mmol/L (ref 134–144)

## 2018-01-07 ENCOUNTER — Ambulatory Visit (HOSPITAL_COMMUNITY)
Admission: RE | Admit: 2018-01-07 | Discharge: 2018-01-07 | Disposition: A | Discharge status: Home / Self Care | Payer: Self-pay | Source: Ambulatory Visit | Attending: Internal Medicine | Admitting: Internal Medicine

## 2018-01-07 DIAGNOSIS — N184 Chronic kidney disease, stage 4 (severe): Secondary | ICD-10-CM | POA: Insufficient documentation

## 2018-01-07 DIAGNOSIS — R93421 Abnormal radiologic findings on diagnostic imaging of right kidney: Secondary | ICD-10-CM | POA: Insufficient documentation

## 2018-01-07 DIAGNOSIS — R93422 Abnormal radiologic findings on diagnostic imaging of left kidney: Secondary | ICD-10-CM | POA: Insufficient documentation

## 2018-01-07 DIAGNOSIS — I129 Hypertensive chronic kidney disease with stage 1 through stage 4 chronic kidney disease, or unspecified chronic kidney disease: Secondary | ICD-10-CM | POA: Insufficient documentation

## 2018-01-15 MED FILL — $BOOSTRIX VACCINE SYRINGE: 5-2.5-18.5 | 1 days supply | Qty: 1 | Fill #0

## 2018-01-18 MED FILL — LABETALOL HCL 200 MG TABLET: 200 | 30 days supply | Qty: 60 | Fill #1

## 2018-01-18 MED FILL — AMLODIPINE BESYLATE 10 MG T: 10 | 30 days supply | Qty: 30 | Fill #0

## 2018-01-18 MED FILL — ISOSORBIDE MN ER 30 MG TAB: 30 | 30 days supply | Qty: 30 | Fill #0

## 2018-02-01 MED FILL — hydrALAZINE HCL 50 MG TABS: 50 | 30 days supply | Qty: 90 | Fill #1

## 2018-02-16 MED FILL — ISOSORBIDE MN ER 30 MG TAB: 30 | 30 days supply | Qty: 30 | Fill #1

## 2018-02-16 MED FILL — AMLODIPINE BESYLATE 10 MG T: 10 | 30 days supply | Qty: 30 | Fill #1

## 2018-02-16 MED FILL — LABETALOL HCL 200 MG TABLET: 200 | 30 days supply | Qty: 60 | Fill #2

## 2018-03-15 MED FILL — LABETALOL HCL 200 MG TABLET: 200 | 30 days supply | Qty: 60 | Fill #3

## 2018-03-15 MED FILL — hydrALAZINE HCL 50 MG TABS: 50 | 30 days supply | Qty: 90 | Fill #2

## 2018-03-15 MED FILL — ISOSORBIDE MN ER 30 MG TAB: 30 | 30 days supply | Qty: 30 | Fill #2

## 2018-03-15 MED FILL — AMLODIPINE BESYLATE 10 MG T: 10 | 30 days supply | Qty: 30 | Fill #2

## 2018-04-05 ENCOUNTER — Ambulatory Visit: Payer: Self-pay | Admitting: Internal Medicine

## 2018-04-11 ENCOUNTER — Encounter: Payer: Self-pay | Admitting: Internal Medicine

## 2018-04-15 MED FILL — LABETALOL HCL 200 MG TABLET: 200 | 30 days supply | Qty: 60 | Fill #4

## 2018-04-15 MED FILL — ISOSORBIDE MN ER 30 MG TAB: 30 | 30 days supply | Qty: 30 | Fill #3

## 2018-04-15 MED FILL — AMLODIPINE BESYLATE 10 MG T: 10 | 30 days supply | Qty: 30 | Fill #3

## 2018-04-15 MED FILL — hydrALAZINE HCL 50 MG TABS: 50 | 30 days supply | Qty: 90 | Fill #3

## 2018-05-17 MED FILL — hydrALAZINE HCL 50 MG TABS: 50 | 30 days supply | Qty: 90 | Fill #4

## 2018-05-17 MED FILL — LABETALOL HCL 200 MG TABLET: 200 | 30 days supply | Qty: 60 | Fill #5

## 2018-05-17 MED FILL — AMLODIPINE BESYLATE 10 MG T: 10 | 30 days supply | Qty: 30 | Fill #4

## 2018-05-17 MED FILL — ISOSORBIDE MN ER 30 MG TAB: 30 | 30 days supply | Qty: 30 | Fill #4

## 2018-05-21 ENCOUNTER — Ambulatory Visit: Payer: Self-pay | Admitting: Internal Medicine

## 2018-06-14 ENCOUNTER — Other Ambulatory Visit: Payer: Self-pay | Admitting: Internal Medicine

## 2018-06-14 DIAGNOSIS — I10 Essential (primary) hypertension: Secondary | ICD-10-CM

## 2018-06-14 MED FILL — LABETALOL HCL 200 MG TABLET: 200 | 30 days supply | Qty: 60 | Fill #6

## 2018-06-14 MED FILL — AMLODIPINE BESYLATE 10 MG T: 10 | 30 days supply | Qty: 30 | Fill #5

## 2018-06-14 MED FILL — hydrALAZINE HCL 50 MG TABS: 50 | 30 days supply | Qty: 90 | Fill #5

## 2018-06-14 MED FILL — ISOSORBIDE MN ER 30 MG TAB: 30 | 30 days supply | Qty: 30 | Fill #5

## 2018-07-22 MED FILL — LABETALOL HCL 200 MG TABLET: 200 | 30 days supply | Qty: 60 | Fill #0

## 2018-07-22 MED FILL — AMLODIPINE BESYLATE 10 MG T: 10 | 30 days supply | Qty: 30 | Fill #6

## 2018-07-22 MED FILL — ISOSORBIDE MN ER 30 MG TAB: 30 | 30 days supply | Qty: 30 | Fill #6

## 2018-07-22 MED FILL — hydrALAZINE HCL 50 MG TABS: 50 | 30 days supply | Qty: 90 | Fill #6

## 2018-08-23 ENCOUNTER — Other Ambulatory Visit: Payer: Self-pay | Admitting: Internal Medicine

## 2018-08-23 DIAGNOSIS — I10 Essential (primary) hypertension: Secondary | ICD-10-CM

## 2018-08-23 MED FILL — hydrALAZINE HCL 50 MG TABS: 50 | 30 days supply | Qty: 90 | Fill #7

## 2018-08-24 MED FILL — AMLODIPINE BESYLATE 10 MG T: 10 | 30 days supply | Qty: 30 | Fill #0

## 2018-08-24 MED FILL — LABETALOL HCL 200 MG TABLET: 200 | 30 days supply | Qty: 60 | Fill #0

## 2018-08-24 MED FILL — ISOSORBIDE MN ER 30 MG TAB: 30 | 30 days supply | Qty: 30 | Fill #0

## 2018-09-06 ENCOUNTER — Ambulatory Visit: Payer: Self-pay | Attending: Internal Medicine | Admitting: Internal Medicine

## 2018-09-06 ENCOUNTER — Encounter: Payer: Self-pay | Admitting: Internal Medicine

## 2018-09-06 VITALS — BP 117/73 | HR 77 | Temp 98.2°F | Resp 16 | Wt 212.0 lb

## 2018-09-06 DIAGNOSIS — Z87891 Personal history of nicotine dependence: Secondary | ICD-10-CM | POA: Insufficient documentation

## 2018-09-06 DIAGNOSIS — Z823 Family history of stroke: Secondary | ICD-10-CM | POA: Insufficient documentation

## 2018-09-06 DIAGNOSIS — Z2821 Immunization not carried out because of patient refusal: Secondary | ICD-10-CM

## 2018-09-06 DIAGNOSIS — Z79899 Other long term (current) drug therapy: Secondary | ICD-10-CM | POA: Insufficient documentation

## 2018-09-06 DIAGNOSIS — I10 Essential (primary) hypertension: Secondary | ICD-10-CM

## 2018-09-06 DIAGNOSIS — Z7982 Long term (current) use of aspirin: Secondary | ICD-10-CM | POA: Insufficient documentation

## 2018-09-06 DIAGNOSIS — I129 Hypertensive chronic kidney disease with stage 1 through stage 4 chronic kidney disease, or unspecified chronic kidney disease: Secondary | ICD-10-CM | POA: Insufficient documentation

## 2018-09-06 DIAGNOSIS — R220 Localized swelling, mass and lump, head: Secondary | ICD-10-CM

## 2018-09-06 DIAGNOSIS — Z1211 Encounter for screening for malignant neoplasm of colon: Secondary | ICD-10-CM

## 2018-09-06 DIAGNOSIS — Z8249 Family history of ischemic heart disease and other diseases of the circulatory system: Secondary | ICD-10-CM | POA: Insufficient documentation

## 2018-09-06 DIAGNOSIS — Z8673 Personal history of transient ischemic attack (TIA), and cerebral infarction without residual deficits: Secondary | ICD-10-CM | POA: Insufficient documentation

## 2018-09-06 DIAGNOSIS — N184 Chronic kidney disease, stage 4 (severe): Secondary | ICD-10-CM | POA: Insufficient documentation

## 2018-09-06 MED ORDER — HYDRALAZINE HCL 50 MG PO TABS: 50.0000 mg | ORAL_TABLET | Freq: Three times a day (TID) | ORAL | 6 refills | Status: DC

## 2018-09-06 MED ORDER — ISOSORBIDE MONONITRATE ER 30 MG PO TB24: 30.0000 mg | ORAL_TABLET | Freq: Every day | ORAL | 6 refills | Status: DC

## 2018-09-06 MED ORDER — LABETALOL HCL 200 MG PO TABS: 200.0000 mg | ORAL_TABLET | Freq: Two times a day (BID) | ORAL | 6 refills | Status: DC

## 2018-09-06 MED ORDER — AMLODIPINE BESYLATE 10 MG PO TABS: 10.0000 mg | ORAL_TABLET | Freq: Every day | ORAL | 6 refills | Status: DC

## 2018-09-06 NOTE — Progress Notes (Signed)
Patient ID: Fred Harrison, male    DOB: February 14, 1966  MRN: [id number]  CC: Hypertension   Subjective: Fred Harrison is a 52 y.o. male who presents for chronic ds management. His concerns today include:  Pt with hx of HTN, TIA, CKD stage 4, former tob  CKD 4:  Never saw nephrologist as they do not take the OC.  Now has insurance.  Still making good urine.  Some lower extremity edema since yesterday which he attributes to bending for extended period of time putting furniture together.  HTN:  Checks BP occasionally Limits salt in foods..  Reports compliance with meds No CP/SOB.  LE edema yesterday after bending a lot while building furniture.   Did not get to see derm for mass on scalp due to lack of insurance.  Mass is unchanged.  He would still like to see the dermatologist now that he has insurance.   HM: Was given fit test earlier this year.  Patient states that he did mail it in but we never received the results.  He is agreeable to doing colonoscopy for colon cancer screening.  He declines flu shot today. Patient Active Problem List   Diagnosis Date Noted  . Scalp mass 12/01/2017  . CKD stage 4 secondary to hypertension ([place]) 12/01/2017  . Elevated hemoglobin ([place]) 08/13/2016  . Hypokalemia   . TIA (transient ischemic attack) 08/03/2016  . HTN (hypertension) 08/03/2016     Current Outpatient Medications on File Prior to Visit  Medication Sig Dispense Refill  . aspirin 81 MG tablet Take 1 tablet (81 mg total) by mouth daily. 30 tablet 0   No current facility-administered medications on file prior to visit.     No Known Allergies  Social History   Socioeconomic History  . Marital status: Divorced    Spouse name: Not on file  . Number of children: Not on file  . Years of education: Not on file  . Highest education level: Not on file  Occupational History  . Not on file  Social Needs  . Financial resource strain: Not on file  . Food insecurity:    Worry: Not  on file    Inability: Not on file  . Transportation needs:    Medical: Not on file    Non-medical: Not on file  Tobacco Use  . Smoking status: Former Smoker    Types: Cigarettes    Last attempt to quit: 07/02/2016    Years since quitting: 2.1  . Smokeless tobacco: Never Used  Substance and Sexual Activity  . Alcohol use: No  . Drug use: No  . Sexual activity: Not on file  Lifestyle  . Physical activity:    Days per week: Not on file    Minutes per session: Not on file  . Stress: Not on file  Relationships  . Social connections:    Talks on phone: Not on file    Gets together: Not on file    Attends religious service: Not on file    Active member of club or organization: Not on file    Attends meetings of clubs or organizations: Not on file    Relationship status: Not on file  . Intimate partner violence:    Fear of current or ex partner: Not on file    Emotionally abused: Not on file    Physically abused: Not on file    Forced sexual activity: Not on file  Other Topics Concern  . Not on file  Social History Narrative  . Not on file    Family History  Problem Relation Age of Onset  . Stroke Mother   . Hypertension Mother   . Heart attack Father   . Cancer Sister     No past surgical history on file.  ROS: Review of Systems Negative except as above. PHYSICAL EXAM: BP 117/73   Pulse 77   Temp 98.2 F (36.8 C) (Oral)   Resp 16   Wt 212 lb (96.2 kg)   SpO2 95%   BMI 34.22 kg/m   BP 124/85 Physical Exam General appearance - alert, well appearing, and in no distress Mental status - normal mood, behavior, speech, dress, motor activity, and thought processes Neck - supple, no significant adenopathy Chest - clear to auscultation, no wheezes, rales or rhonchi, symmetric air entry Heart - normal rate, regular rhythm, normal S1, S2, no murmurs, rubs, clicks or gallops Extremities - peripheral pulses normal, no pedal edema, no clubbing or cyanosis Skin -golf ball  size mass in the anterior scalp.     ASSESSMENT AND PLAN: 1. Essential hypertension At goal.  Continue current medications.  Refills given. - labetalol (NORMODYNE) 200 MG tablet; Take 1 tablet (200 mg total) by mouth 2 (two) times daily.  Dispense: 60 tablet; Refill: 6 - amLODipine (NORVASC) 10 MG tablet; Take 1 tablet (10 mg total) by mouth daily.  Dispense: 30 tablet; Refill: 6 - isosorbide mononitrate (IMDUR) 30 MG 24 hr tablet; Take 1 tablet (30 mg total) by mouth daily.  Dispense: 30 tablet; Refill: 6 - hydrALAZINE (APRESOLINE) 50 MG tablet; Take 1 tablet (50 mg total) by mouth 3 (three) times daily.  Dispense: 180 tablet; Refill: 6 - Comprehensive metabolic panel - CBC - Lipid panel  2. CKD stage 4 secondary to hypertension [facility]) Referral submitted to nephrology.  He is not on any NSAIDs. - Ambulatory referral to Nephrology  3. Scalp mass - Ambulatory referral to Dermatology  4. Influenza vaccination declined  5. Colon cancer screening - Ambulatory referral to Gastroenterology  Patient was given the opportunity to ask questions.  Patient verbalized understanding of the plan and was able to repeat key elements of the plan.   Orders Placed This Encounter  Procedures  . Comprehensive metabolic panel  . CBC  . Lipid panel  . Ambulatory referral to Nephrology  . Ambulatory referral to Gastroenterology  . Ambulatory referral to Dermatology     Requested Prescriptions   Signed Prescriptions Disp Refills  . labetalol (NORMODYNE) 200 MG tablet 60 tablet 6    Sig: Take 1 tablet (200 mg total) by mouth 2 (two) times daily.  Marland Kitchen amLODipine (NORVASC) 10 MG tablet 30 tablet 6    Sig: Take 1 tablet (10 mg total) by mouth daily.  . isosorbide mononitrate (IMDUR) 30 MG 24 hr tablet 30 tablet 6    Sig: Take 1 tablet (30 mg total) by mouth daily.  . hydrALAZINE (APRESOLINE) 50 MG tablet 180 tablet 6    Sig: Take 1 tablet (50 mg total) by mouth 3 (three) times daily.    Return  in about 4 months (around 01/07/2019).  Karle Plumber, MD, FACP

## 2018-09-07 ENCOUNTER — Other Ambulatory Visit: Payer: Self-pay | Admitting: Internal Medicine

## 2018-09-07 ENCOUNTER — Telehealth: Payer: Self-pay | Admitting: Internal Medicine

## 2018-09-07 LAB — CBC
HEMOGLOBIN: 17.5 g/dL (ref 13.0–17.7)
Hematocrit: 53.4 % — ABNORMAL HIGH (ref 37.5–51.0)
MCH: 24.6 pg — AB (ref 26.6–33.0)
MCHC: 32.8 g/dL (ref 31.5–35.7)
MCV: 75 fL — ABNORMAL LOW (ref 79–97)
Platelets: 411 10*3/uL (ref 150–450)
RBC: 7.12 x10E6/uL (ref 4.14–5.80)
RDW: 17 % — ABNORMAL HIGH (ref 12.3–15.4)
WBC: 7.2 10*3/uL (ref 3.4–10.8)

## 2018-09-07 LAB — COMPREHENSIVE METABOLIC PANEL
A/G RATIO: 1.1 — AB (ref 1.2–2.2)
ALBUMIN: 3.6 g/dL (ref 3.5–5.5)
ALT: 16 IU/L (ref 0–44)
AST: 19 IU/L (ref 0–40)
Alkaline Phosphatase: 59 IU/L (ref 39–117)
BILIRUBIN TOTAL: 0.6 mg/dL (ref 0.0–1.2)
BUN / CREAT RATIO: 9 (ref 9–20)
BUN: 56 mg/dL — AB (ref 6–24)
CO2: 19 mmol/L — ABNORMAL LOW (ref 20–29)
Calcium: 8.3 mg/dL — ABNORMAL LOW (ref 8.7–10.2)
Chloride: 100 mmol/L (ref 96–106)
Creatinine, Ser: 5.96 mg/dL — ABNORMAL HIGH (ref 0.76–1.27)
GFR, EST AFRICAN AMERICAN: 11 mL/min/{1.73_m2} — AB (ref >59)
GFR, EST NON AFRICAN AMERICAN: 10 mL/min/{1.73_m2} — AB (ref >59)
GLUCOSE: 103 mg/dL — AB (ref 65–99)
Globulin, Total: 3.2 g/dL (ref 1.5–4.5)
Potassium: 4.5 mmol/L (ref 3.5–5.2)
Sodium: 140 mmol/L (ref 134–144)
Total Protein: 6.8 g/dL (ref 6.0–8.5)

## 2018-09-07 LAB — LIPID PANEL
CHOL/HDL RATIO: 5.1 ratio — AB (ref 0.0–5.0)
Cholesterol, Total: 163 mg/dL (ref 100–199)
HDL: 32 mg/dL — ABNORMAL LOW (ref >39)
LDL CALC: 103 mg/dL — AB (ref 0–99)
Triglycerides: 141 mg/dL (ref 0–149)
VLDL Cholesterol Cal: 28 mg/dL (ref 5–40)

## 2018-09-07 MED ORDER — ATORVASTATIN CALCIUM 20 MG PO TABS: 20.0000 mg | ORAL_TABLET | Freq: Every day | ORAL | 3 refills | Status: DC

## 2018-09-07 NOTE — Telephone Encounter (Signed)
PC placed to pt's mobile phone this evening to discuss lab results.  I left VM stating who I am and that I needed to reach him to discuss lab results.  I will try him again tomorrow.  Will also send message to his Mychart account.

## 2018-09-08 ENCOUNTER — Telehealth: Payer: Self-pay | Admitting: Internal Medicine

## 2018-09-08 DIAGNOSIS — D751 Secondary polycythemia: Secondary | ICD-10-CM

## 2018-09-08 NOTE — Telephone Encounter (Signed)
PC placed to pt again this a.m.  I see that he did view my lab note on Mychart but it appears he did not read the one concerning his cholesterol. I advised starting Lipitor.  His ASCVD risk score is 13%. Left VM message. 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

## 2018-09-10 ENCOUNTER — Telehealth: Payer: Self-pay | Admitting: Internal Medicine

## 2018-09-10 NOTE — Telephone Encounter (Signed)
-----   Message from Ena Dawley sent at 09/10/2018  9:53 AM EDT ----- Gm Dr Wynetta Emery   I don't see the insurance patient in the system . I called again and lvm to call me back a.s.a.p . ----- Message ----- From: Ladell Pier, MD Sent: 09/09/2018   9:42 PM EDT To: Ena Dawley  But this pt now has insurance. ----- Message ----- From: Ena Dawley Sent: 09/09/2018  10:10 AM EDT To: Ladell Pier, MD  Gm  I lvm to patient to contact me regarding his referral because we don't have a nephrology in the network and he has to pay out of his pocket .  ----- Message ----- From: Ladell Pier, MD Sent: 09/08/2018  11:11 AM EDT To: Ena Dawley  Can we get him in with the nephrologist ASAP please?  Thanks.

## 2018-09-21 MED FILL — ISOSORBIDE MN ER 30 MG TAB: 30 | 30 days supply | Qty: 30 | Fill #0

## 2018-09-21 MED FILL — LABETALOL HCL 200 MG TABLET: 200 | 30 days supply | Qty: 60 | Fill #0

## 2018-09-21 MED FILL — AMLODIPINE BESYLATE 10 MG T: 10 | 30 days supply | Qty: 30 | Fill #0

## 2018-09-21 MED FILL — hydrALAZINE HCL 50 MG TABS: 50 | 30 days supply | Qty: 60 | Fill #0

## 2018-09-27 ENCOUNTER — Ambulatory Visit: Payer: No Typology Code available for payment source | Attending: Internal Medicine

## 2018-09-30 ENCOUNTER — Other Ambulatory Visit: Payer: Self-pay | Admitting: Hematology

## 2018-09-30 DIAGNOSIS — D582 Other hemoglobinopathies: Secondary | ICD-10-CM

## 2018-10-07 ENCOUNTER — Inpatient Hospital Stay: Payer: No Typology Code available for payment source | Attending: Hematology | Admitting: Hematology

## 2018-10-07 ENCOUNTER — Inpatient Hospital Stay: Payer: No Typology Code available for payment source

## 2018-10-18 MED FILL — LABETALOL HCL 200 MG TABLET: 200 | 30 days supply | Qty: 60 | Fill #1

## 2018-10-18 MED FILL — hydrALAZINE HCL 50 MG TABS: 50 | 30 days supply | Qty: 90 | Fill #1

## 2018-10-18 MED FILL — ISOSORBIDE MN ER 30 MG TAB: 30 | 30 days supply | Qty: 30 | Fill #1

## 2018-10-18 MED FILL — AMLODIPINE BESYLATE 10 MG T: 10 | 30 days supply | Qty: 30 | Fill #1

## 2018-11-15 MED FILL — LABETALOL HCL 200 MG TABLET: 200 | 30 days supply | Qty: 60 | Fill #2

## 2018-11-15 MED FILL — ISOSORBIDE MN ER 30 MG TAB: 30 | 30 days supply | Qty: 30 | Fill #2

## 2018-11-15 MED FILL — AMLODIPINE BESYLATE 10 MG T: 10 | 30 days supply | Qty: 30 | Fill #2

## 2018-11-15 MED FILL — hydrALAZINE HCL 50 MG TABS: 50 | 30 days supply | Qty: 90 | Fill #2

## 2018-12-20 MED FILL — LABETALOL HCL 200 MG TABLET: 200 | 30 days supply | Qty: 60 | Fill #3

## 2018-12-20 MED FILL — AMLODIPINE BESYLATE 10 MG T: 10 | 30 days supply | Qty: 30 | Fill #3

## 2018-12-20 MED FILL — ISOSORBIDE MN ER 30 MG TAB: 30 | 30 days supply | Qty: 30 | Fill #3

## 2018-12-20 MED FILL — hydrALAZINE HCL 50 MG TABS: 50 | 30 days supply | Qty: 90 | Fill #3

## 2019-01-19 MED FILL — ISOSORBIDE MN ER 30 MG TAB: 30 | 30 days supply | Qty: 30 | Fill #4

## 2019-01-19 MED FILL — AMLODIPINE BESYLATE 10 MG T: 10 | 30 days supply | Qty: 30 | Fill #4

## 2019-01-19 MED FILL — LABETALOL HCL 200 MG TABLET: 200 | 30 days supply | Qty: 60 | Fill #4

## 2019-01-19 MED FILL — hydrALAZINE HCL 50 MG TABS: 50 | 30 days supply | Qty: 90 | Fill #4

## 2019-02-16 MED FILL — LABETALOL HCL 200 MG TABLET: 200 | 30 days supply | Qty: 60 | Fill #5

## 2019-02-16 MED FILL — ISOSORBIDE MN ER 30 MG TAB: 30 | 30 days supply | Qty: 30 | Fill #5

## 2019-02-16 MED FILL — AMLODIPINE BESYLATE 10 MG T: 10 | 30 days supply | Qty: 30 | Fill #5

## 2019-02-16 MED FILL — hydrALAZINE HCL 50 MG TABS: 50 | 30 days supply | Qty: 90 | Fill #5

## 2019-03-21 MED FILL — hydrALAZINE HCL 50 MG TABS: 50 | 30 days supply | Qty: 90 | Fill #6

## 2019-03-21 MED FILL — ISOSORBIDE MN ER 30 MG TAB: 30 | 30 days supply | Qty: 30 | Fill #6

## 2019-03-21 MED FILL — LABETALOL HCL 200 MG TABLET: 200 | 30 days supply | Qty: 60 | Fill #6

## 2019-03-21 MED FILL — AMLODIPINE BESYLATE 10 MG T: 10 | 30 days supply | Qty: 30 | Fill #6

## 2019-04-19 ENCOUNTER — Other Ambulatory Visit: Payer: Self-pay | Admitting: Internal Medicine

## 2019-04-19 DIAGNOSIS — I10 Essential (primary) hypertension: Secondary | ICD-10-CM

## 2019-04-19 MED FILL — hydrALAZINE HCL 50 MG TABS: 50 | 30 days supply | Qty: 90 | Fill #7

## 2019-04-20 MED FILL — ?AMLODIPINE BESYLATE 10 MG: 10 | 30 days supply | Qty: 30 | Fill #0

## 2019-04-20 MED FILL — ISOSORBIDE MN ER 30 MG TAB: 30 | 30 days supply | Qty: 30 | Fill #0

## 2019-04-20 MED FILL — LABETALOL HCL 200 MG TABLET: 200 | 30 days supply | Qty: 60 | Fill #0

## 2019-05-11 ENCOUNTER — Telehealth: Payer: Self-pay | Admitting: Internal Medicine

## 2019-05-11 NOTE — Telephone Encounter (Signed)
-----   Message from Ladell Pier, MD sent at 04/20/2019 11:15 AM EDT ----- Over due for f/u appt.  1 RF given on meds until then.

## 2019-05-11 NOTE — Telephone Encounter (Signed)
calld the pt lvm for them to call back

## 2019-05-18 ENCOUNTER — Other Ambulatory Visit: Payer: Self-pay

## 2019-05-18 ENCOUNTER — Encounter: Payer: Self-pay | Admitting: Internal Medicine

## 2019-05-18 ENCOUNTER — Ambulatory Visit: Payer: Self-pay | Attending: Internal Medicine | Admitting: Internal Medicine

## 2019-05-18 DIAGNOSIS — D751 Secondary polycythemia: Secondary | ICD-10-CM

## 2019-05-18 DIAGNOSIS — D582 Other hemoglobinopathies: Secondary | ICD-10-CM

## 2019-05-18 DIAGNOSIS — N185 Chronic kidney disease, stage 5: Secondary | ICD-10-CM

## 2019-05-18 DIAGNOSIS — Z1211 Encounter for screening for malignant neoplasm of colon: Secondary | ICD-10-CM

## 2019-05-18 DIAGNOSIS — R220 Localized swelling, mass and lump, head: Secondary | ICD-10-CM

## 2019-05-18 DIAGNOSIS — I10 Essential (primary) hypertension: Secondary | ICD-10-CM

## 2019-05-18 MED ORDER — ATORVASTATIN CALCIUM 20 MG PO TABS: 20.0000 mg | ORAL_TABLET | Freq: Every day | ORAL | 3 refills | Status: DC

## 2019-05-18 MED ORDER — HYDRALAZINE HCL 50 MG PO TABS: 75.0000 mg | ORAL_TABLET | Freq: Three times a day (TID) | ORAL | 6 refills | Status: DC

## 2019-05-18 NOTE — Progress Notes (Signed)
Virtual Visit via Telephone Note Due to current restrictions/limitations of in-office visits due to the COVID-19 pandemic, this scheduled clinical appointment was converted to a telehealth visit  I connected with Fred Harrison on 05/18/19 at 8:52 a.m EDT by telephone and verified that I am speaking with the correct person using two identifiers. I am in my office.  The patient is at home.  Only the patient and myself participated in this encounter.  I discussed the limitations, risks, security and privacy concerns of performing an evaluation and management service by telephone and the availability of in person appointments. I also discussed with the patient that there may be a patient responsible charge related to this service. The patient expressed understanding and agreed to proceed.   History of Present Illness: Pt with hx of HTN, TIA, CKDstage 4, former tob.  Last seen October 2019.  Today's visit was for chronic disease management.  CKD 5/HTN: After his last office visit.  I tried reaching him several times via telephone and also send MyChart messages.  Patient never responded.  His CKD had declined from stage IV to stage V.  We had referred him to nephrology.  Patient states he was called but did not go because of his work schedule.   "I put work before my health." Still making good urine Some LE edema at times.  No CP/SOB Limits salt in foods Checking BP once a day.  Recent readings:  140/92, 179/94, 152/107 Meds: Reports compliance with amlodipine, labetalol, isosorbide and hydralazine.  HL: On last visit, based on lab results I had recommended starting atorvastatin.  Patient states that he never got my message.  However he is willing to take it.   Patient with increased red blood cell mass on CBC.  I had referred him to hematology.  Again he was called but did not take the appointment.  Denies any chronic headaches or dizziness.  No blurred vision  Cyst on scalp: Referred to  dermatology per his request on last visit.  He did not follow through with the appointment.  HM: Still has fit test which he has not turned in as yet.  Observations/Objective: Results for orders placed or performed in visit on 09/06/18  Comprehensive metabolic panel  Result Value Ref Range   Glucose 103 (H) 65 - 99 mg/dL   BUN 56 (H) 6 - 24 mg/dL   Creatinine, Ser 5.96 (H) 0.76 - 1.27 mg/dL   GFR calc non Af Amer 10 (L) >59 mL/min/1.73   GFR calc Af Amer 11 (L) >59 mL/min/1.73   BUN/Creatinine Ratio 9 9 - 20   Sodium 140 134 - 144 mmol/L   Potassium 4.5 3.5 - 5.2 mmol/L   Chloride 100 96 - 106 mmol/L   CO2 19 (L) 20 - 29 mmol/L   Calcium 8.3 (L) 8.7 - 10.2 mg/dL   Total Protein 6.8 6.0 - 8.5 g/dL   Albumin 3.6 3.5 - 5.5 g/dL   Globulin, Total 3.2 1.5 - 4.5 g/dL   Albumin/Globulin Ratio 1.1 (L) 1.2 - 2.2   Bilirubin Total 0.6 0.0 - 1.2 mg/dL   Alkaline Phosphatase 59 39 - 117 IU/L   AST 19 0 - 40 IU/L   ALT 16 0 - 44 IU/L  CBC  Result Value Ref Range   WBC 7.2 3.4 - 10.8 x10E3/uL   RBC 7.12 (HH) 4.14 - 5.80 x10E6/uL   Hemoglobin 17.5 13.0 - 17.7 g/dL   Hematocrit 53.4 (H) 37.5 - 51.0 %  MCV 75 (L) 79 - 97 fL   MCH 24.6 (L) 26.6 - 33.0 pg   MCHC 32.8 31.5 - 35.7 g/dL   RDW 17.0 (H) 12.3 - 15.4 %   Platelets 411 150 - 450 x10E3/uL  Lipid panel  Result Value Ref Range   Cholesterol, Total 163 100 - 199 mg/dL   Triglycerides 141 0 - 149 mg/dL   HDL 32 (L) >39 mg/dL   VLDL Cholesterol Cal 28 5 - 40 mg/dL   LDL Calculated 103 (H) 0 - 99 mg/dL   Chol/HDL Ratio 5.1 (H) 0.0 - 5.0 ratio    Assessment and Plan: 1. Essential hypertension Not at goal.  I recommend increase hydralazine to 75 mg 3 times daily.  Continue other medications and low-salt diet.  Stressed the importance of good blood pressure control due to kidney function.  Goal is 130/80 or lower. - hydrALAZINE (APRESOLINE) 50 MG tablet; Take 1.5 tablets (75 mg total) by mouth 3 (three) times daily.  Dispense: 180  tablet; Refill: 6 - CBC; Future - Comprehensive metabolic panel; Future  2. Chronic kidney disease (CKD), stage V ([place]) Stressed the importance of seeing a kidney specialist sooner rather than later.  Patient is agreeable stating that he will find time in his schedule to keep the appointment - Ambulatory referral to Nephrology - Microalbumin / creatinine urine ratio; Future  3. Polycythemia He is agreeable for [place] to reschedule the appointment with the hematologist.  He may also need a sleep study to rule out sleep apnea as a possible cause - Ambulatory referral to Hematology  4. Scalp mass - Ambulatory referral to Dermatology  5. Colon cancer screening Discussed colon cancer screening.  Encouraged him to turn in the fit test will consider having colonoscopy done   Follow Up Instructions: Follow-up in 3 months.   I discussed the assessment and treatment plan with the patient. The patient was provided an opportunity to ask questions and all were answered. The patient agreed with the plan and demonstrated an understanding of the instructions.   The patient was advised to call back or seek an in-person evaluation if the symptoms worsen or if the condition fails to improve as anticipated.  I provided  15 minutes of non-face-to-face time during this encounter.   Karle Plumber, MD

## 2019-05-19 ENCOUNTER — Other Ambulatory Visit: Payer: Self-pay | Admitting: Internal Medicine

## 2019-05-19 DIAGNOSIS — I10 Essential (primary) hypertension: Secondary | ICD-10-CM

## 2019-05-19 MED FILL — ATORVASTATIN 20 MG TABLET: 20 | 30 days supply | Qty: 30 | Fill #0

## 2019-05-19 MED FILL — hydrALAZINE HCL 50 MG TABS: 50 | 40 days supply | Qty: 180 | Fill #0

## 2019-05-19 NOTE — Telephone Encounter (Signed)
Please refills if appropriate

## 2019-05-20 MED ORDER — AMLODIPINE BESYLATE 10 MG PO TABS: 10.0000 mg | ORAL_TABLET | Freq: Every day | ORAL | 2 refills | Status: DC

## 2019-05-20 MED ORDER — LABETALOL HCL 200 MG PO TABS: 200.0000 mg | ORAL_TABLET | Freq: Two times a day (BID) | ORAL | 2 refills | Status: DC

## 2019-05-20 MED ORDER — ISOSORBIDE MONONITRATE ER 30 MG PO TB24: 30.0000 mg | ORAL_TABLET | Freq: Every day | ORAL | 2 refills | Status: DC

## 2019-05-20 MED FILL — ISOSORBIDE MN ER 30 MG TAB: 30 | 30 days supply | Qty: 30 | Fill #0

## 2019-05-20 MED FILL — LABETALOL HCL 200 MG TABLET: 200 | 30 days supply | Qty: 60 | Fill #0

## 2019-05-20 MED FILL — ?AMLODIPINE BESYLATE 10 MG: 10 | 30 days supply | Qty: 30 | Fill #0

## 2019-05-23 ENCOUNTER — Telehealth: Payer: Self-pay | Admitting: Hematology

## 2019-05-23 NOTE — Telephone Encounter (Signed)
Received a new hem referral from Dr. Shelia Media from thrombocytopenia and worsening bruising. I cld and spoke to Fred Harrison's daughter Levada Dy and scheduled to the pt to see Dr. Burr Medico on 6/30 at 230pm.Aware to arrive 20 minutes early.

## 2019-05-24 ENCOUNTER — Telehealth: Payer: Self-pay | Admitting: Hematology and Oncology

## 2019-05-24 NOTE — Telephone Encounter (Signed)
Created in error

## 2019-06-20 MED FILL — ?AMLODIPINE BESYLATE 10 MG: 10 | 30 days supply | Qty: 30 | Fill #1

## 2019-06-20 MED FILL — LABETALOL HCL 200 MG TABLET: 200 | 30 days supply | Qty: 60 | Fill #1

## 2019-06-20 MED FILL — ISOSORBIDE MN ER 30 MG TAB: 30 | 30 days supply | Qty: 30 | Fill #1

## 2019-06-20 MED FILL — ATORVASTATIN 20 MG TABLET: 20 | 30 days supply | Qty: 30 | Fill #1

## 2019-06-20 MED FILL — hydrALAZINE HCL 50 MG TABS: 50 | 40 days supply | Qty: 180 | Fill #1

## 2019-06-27 ENCOUNTER — Telehealth: Payer: Self-pay | Admitting: Internal Medicine

## 2019-07-19 MED FILL — LABETALOL HCL 200 MG TABLET: 200 | 30 days supply | Qty: 60 | Fill #2

## 2019-07-19 MED FILL — ISOSORBIDE MN ER 30 MG TAB: 30 | 30 days supply | Qty: 30 | Fill #2

## 2019-07-19 MED FILL — ?ATORVASTATIN 20 MG TABLET: 20 | 30 days supply | Qty: 30 | Fill #2

## 2019-07-19 MED FILL — ?AMLODIPINE BESYLATE 10 MG: 10 | 30 days supply | Qty: 30 | Fill #2

## 2019-09-13 ENCOUNTER — Emergency Department (HOSPITAL_COMMUNITY): Payer: PRIVATE HEALTH INSURANCE

## 2019-09-13 ENCOUNTER — Inpatient Hospital Stay (HOSPITAL_COMMUNITY)
Admission: EM | Admit: 2019-09-13 | Discharge: 2019-09-17 | DRG: 674 | Disposition: A | Discharge status: Home / Self Care | Payer: PRIVATE HEALTH INSURANCE | Attending: Internal Medicine | Admitting: Internal Medicine

## 2019-09-13 ENCOUNTER — Inpatient Hospital Stay (HOSPITAL_COMMUNITY): Payer: PRIVATE HEALTH INSURANCE

## 2019-09-13 ENCOUNTER — Other Ambulatory Visit: Payer: Self-pay

## 2019-09-13 ENCOUNTER — Encounter (HOSPITAL_COMMUNITY): Payer: Self-pay

## 2019-09-13 DIAGNOSIS — E872 Acidosis: Secondary | ICD-10-CM | POA: Diagnosis present

## 2019-09-13 DIAGNOSIS — T50916A Underdosing of multiple unspecified drugs, medicaments and biological substances, initial encounter: Secondary | ICD-10-CM | POA: Diagnosis present

## 2019-09-13 DIAGNOSIS — M899 Disorder of bone, unspecified: Secondary | ICD-10-CM | POA: Diagnosis not present

## 2019-09-13 DIAGNOSIS — Z7982 Long term (current) use of aspirin: Secondary | ICD-10-CM

## 2019-09-13 DIAGNOSIS — Z823 Family history of stroke: Secondary | ICD-10-CM | POA: Diagnosis not present

## 2019-09-13 DIAGNOSIS — I129 Hypertensive chronic kidney disease with stage 1 through stage 4 chronic kidney disease, or unspecified chronic kidney disease: Secondary | ICD-10-CM

## 2019-09-13 DIAGNOSIS — Y929 Unspecified place or not applicable: Secondary | ICD-10-CM

## 2019-09-13 DIAGNOSIS — Z6830 Body mass index (BMI) 30.0-30.9, adult: Secondary | ICD-10-CM | POA: Diagnosis not present

## 2019-09-13 DIAGNOSIS — Z79899 Other long term (current) drug therapy: Secondary | ICD-10-CM

## 2019-09-13 DIAGNOSIS — R9431 Abnormal electrocardiogram [ECG] [EKG]: Secondary | ICD-10-CM | POA: Diagnosis present

## 2019-09-13 DIAGNOSIS — Z8249 Family history of ischemic heart disease and other diseases of the circulatory system: Secondary | ICD-10-CM

## 2019-09-13 DIAGNOSIS — R630 Anorexia: Secondary | ICD-10-CM

## 2019-09-13 DIAGNOSIS — Z87891 Personal history of nicotine dependence: Secondary | ICD-10-CM

## 2019-09-13 DIAGNOSIS — Z992 Dependence on renal dialysis: Secondary | ICD-10-CM | POA: Diagnosis not present

## 2019-09-13 DIAGNOSIS — I10 Essential (primary) hypertension: Secondary | ICD-10-CM | POA: Diagnosis present

## 2019-09-13 DIAGNOSIS — N184 Chronic kidney disease, stage 4 (severe): Secondary | ICD-10-CM | POA: Diagnosis not present

## 2019-09-13 DIAGNOSIS — Z91120 Patient's intentional underdosing of medication regimen due to financial hardship: Secondary | ICD-10-CM

## 2019-09-13 DIAGNOSIS — R55 Syncope and collapse: Secondary | ICD-10-CM

## 2019-09-13 DIAGNOSIS — E785 Hyperlipidemia, unspecified: Secondary | ICD-10-CM | POA: Diagnosis present

## 2019-09-13 DIAGNOSIS — Z419 Encounter for procedure for purposes other than remedying health state, unspecified: Secondary | ICD-10-CM

## 2019-09-13 DIAGNOSIS — I120 Hypertensive chronic kidney disease with stage 5 chronic kidney disease or end stage renal disease: Secondary | ICD-10-CM | POA: Diagnosis present

## 2019-09-13 DIAGNOSIS — D649 Anemia, unspecified: Secondary | ICD-10-CM | POA: Diagnosis not present

## 2019-09-13 DIAGNOSIS — I517 Cardiomegaly: Secondary | ICD-10-CM | POA: Diagnosis not present

## 2019-09-13 DIAGNOSIS — D631 Anemia in chronic kidney disease: Secondary | ICD-10-CM | POA: Diagnosis present

## 2019-09-13 DIAGNOSIS — R634 Abnormal weight loss: Secondary | ICD-10-CM | POA: Diagnosis present

## 2019-09-13 DIAGNOSIS — N185 Chronic kidney disease, stage 5: Secondary | ICD-10-CM | POA: Diagnosis not present

## 2019-09-13 DIAGNOSIS — N186 End stage renal disease: Secondary | ICD-10-CM | POA: Diagnosis present

## 2019-09-13 DIAGNOSIS — Z20828 Contact with and (suspected) exposure to other viral communicable diseases: Secondary | ICD-10-CM | POA: Diagnosis present

## 2019-09-13 DIAGNOSIS — E8770 Fluid overload, unspecified: Secondary | ICD-10-CM | POA: Diagnosis present

## 2019-09-13 DIAGNOSIS — Z8673 Personal history of transient ischemic attack (TIA), and cerebral infarction without residual deficits: Secondary | ICD-10-CM

## 2019-09-13 DIAGNOSIS — N179 Acute kidney failure, unspecified: Principal | ICD-10-CM | POA: Diagnosis present

## 2019-09-13 DIAGNOSIS — N189 Chronic kidney disease, unspecified: Secondary | ICD-10-CM | POA: Diagnosis present

## 2019-09-13 DIAGNOSIS — I959 Hypotension, unspecified: Secondary | ICD-10-CM | POA: Diagnosis not present

## 2019-09-13 LAB — CBC WITH DIFFERENTIAL/PLATELET
Abs Immature Granulocytes: 0.04 10*3/uL (ref 0.00–0.07)
Basophils Absolute: 0.1 10*3/uL (ref 0.0–0.1)
Basophils Relative: 1 %
Eosinophils Absolute: 0.2 10*3/uL (ref 0.0–0.5)
Eosinophils Relative: 3 %
HCT: 35.5 % — ABNORMAL LOW (ref 39.0–52.0)
Hemoglobin: 11.4 g/dL — ABNORMAL LOW (ref 13.0–17.0)
Immature Granulocytes: 1 %
Lymphocytes Relative: 5 %
Lymphs Abs: 0.4 10*3/uL — ABNORMAL LOW (ref 0.7–4.0)
MCH: 24.5 pg — ABNORMAL LOW (ref 26.0–34.0)
MCHC: 32.1 g/dL (ref 30.0–36.0)
MCV: 76.2 fL — ABNORMAL LOW (ref 80.0–100.0)
Monocytes Absolute: 0.4 10*3/uL (ref 0.1–1.0)
Monocytes Relative: 5 %
Neutro Abs: 7.5 10*3/uL (ref 1.7–7.7)
Neutrophils Relative %: 85 %
Platelets: 332 10*3/uL (ref 150–400)
RBC: 4.66 MIL/uL (ref 4.22–5.81)
RDW: 15 % (ref 11.5–15.5)
WBC: 8.7 10*3/uL (ref 4.0–10.5)
nRBC: 0 % (ref 0.0–0.2)

## 2019-09-13 LAB — RENAL FUNCTION PANEL
Albumin: 3.1 g/dL — ABNORMAL LOW (ref 3.5–5.0)
Anion gap: 22 — ABNORMAL HIGH (ref 5–15)
BUN: 191 mg/dL — ABNORMAL HIGH (ref 6–20)
CO2: 15 mmol/L — ABNORMAL LOW (ref 22–32)
Calcium: 6.1 mg/dL — CL (ref 8.9–10.3)
Chloride: 101 mmol/L (ref 98–111)
Creatinine, Ser: 22.67 mg/dL — ABNORMAL HIGH (ref 0.61–1.24)
GFR calc Af Amer: 2 mL/min — ABNORMAL LOW (ref >60)
GFR calc non Af Amer: 2 mL/min — ABNORMAL LOW (ref >60)
Glucose, Bld: 154 mg/dL — ABNORMAL HIGH (ref 70–99)
Phosphorus: 8.6 mg/dL — ABNORMAL HIGH (ref 2.5–4.6)
Potassium: 4.8 mmol/L (ref 3.5–5.1)
Sodium: 138 mmol/L (ref 135–145)

## 2019-09-13 LAB — COMPREHENSIVE METABOLIC PANEL
ALT: 10 U/L (ref 0–44)
AST: 11 U/L — ABNORMAL LOW (ref 15–41)
Albumin: 3.2 g/dL — ABNORMAL LOW (ref 3.5–5.0)
Alkaline Phosphatase: 54 U/L (ref 38–126)
Anion gap: 24 — ABNORMAL HIGH (ref 5–15)
BUN: 187 mg/dL — ABNORMAL HIGH (ref 6–20)
CO2: 13 mmol/L — ABNORMAL LOW (ref 22–32)
Calcium: 6.3 mg/dL — CL (ref 8.9–10.3)
Chloride: 103 mmol/L (ref 98–111)
Creatinine, Ser: 22.18 mg/dL — ABNORMAL HIGH (ref 0.61–1.24)
GFR calc Af Amer: 2 mL/min — ABNORMAL LOW (ref >60)
GFR calc non Af Amer: 2 mL/min — ABNORMAL LOW (ref >60)
Glucose, Bld: 127 mg/dL — ABNORMAL HIGH (ref 70–99)
Potassium: 4.6 mmol/L (ref 3.5–5.1)
Sodium: 140 mmol/L (ref 135–145)
Total Bilirubin: 0.8 mg/dL (ref 0.3–1.2)
Total Protein: 6.8 g/dL (ref 6.5–8.1)

## 2019-09-13 LAB — MAGNESIUM: Magnesium: 2 mg/dL (ref 1.7–2.4)

## 2019-09-13 LAB — SARS CORONAVIRUS 2 (TAT 6-24 HRS): SARS Coronavirus 2: NEGATIVE

## 2019-09-13 LAB — TROPONIN I (HIGH SENSITIVITY)
Troponin I (High Sensitivity): 108 ng/L (ref <18)
Troponin I (High Sensitivity): 106 ng/L (ref <18)

## 2019-09-13 LAB — HIV ANTIBODY (ROUTINE TESTING W REFLEX): HIV Screen 4th Generation wRfx: NONREACTIVE

## 2019-09-13 MED ORDER — HEPARIN SODIUM (PORCINE) 5000 UNIT/ML IJ SOLN: 5000.0000 [IU] | Freq: Three times a day (TID) | INTRAMUSCULAR | Status: DC

## 2019-09-13 MED ORDER — ACETAMINOPHEN 650 MG RE SUPP: 650.0000 mg | Freq: Four times a day (QID) | RECTAL | Status: DC | PRN

## 2019-09-13 MED ORDER — ACETAMINOPHEN 325 MG PO TABS: 650.0000 mg | ORAL_TABLET | Freq: Four times a day (QID) | ORAL | Status: DC | PRN

## 2019-09-13 MED ORDER — CALCIUM GLUCONATE-NACL 1-0.675 GM/50ML-% IV SOLN
1.0000 g | Freq: Once | INTRAVENOUS | Status: AC
  Administered 2019-09-13: 1000 mg via INTRAVENOUS
  Filled 2019-09-13: qty 50

## 2019-09-13 MED ORDER — CHLORHEXIDINE GLUCONATE CLOTH 2 % EX PADS
6.0000 | MEDICATED_PAD | Freq: Every day | CUTANEOUS | Status: DC
  Administered 2019-09-14 – 2019-09-15 (×2): 6 via TOPICAL

## 2019-09-13 MED ORDER — HEPARIN SODIUM (PORCINE) 5000 UNIT/ML IJ SOLN
5000.0000 [IU] | Freq: Three times a day (TID) | INTRAMUSCULAR | Status: DC
  Administered 2019-09-14 – 2019-09-17 (×8): 5000 [IU] via SUBCUTANEOUS
  Filled 2019-09-13 (×9): qty 1

## 2019-09-13 NOTE — ED Notes (Signed)
Pt aware that a urine sample is needed; urinal at bedside 

## 2019-09-13 NOTE — ED Notes (Signed)
Lunch Tray Ordered @ 1028. 

## 2019-09-13 NOTE — ED Notes (Signed)
Dr. Tyrone Nine notified of trop and calcium

## 2019-09-13 NOTE — Progress Notes (Signed)
MD paged about Critical value awaiting call back

## 2019-09-13 NOTE — H&P (Signed)
Date: 09/13/2019               Patient Name:  Fred Harrison MRN: [id number]  DOB: [birth year] Age / Sex: 53 y.o., male   PCP: Ladell Pier, MD         Medical Service: Internal Medicine Teaching Service         Attending Physician: Dr. Aldine Contes, MD    First Contact: Dr. Gilford Rile Pager: [phone]  Second Contact: Dr. Myrtie Hawk Pager: [phone]       After Hours (After 5p/  First Contact Pager: [phone]  weekends / holidays): Second Contact Pager: [phone]   Chief Complaint: "dizziness and nearly passing out"  History of Present Illness: Fred Harrison is a 53 year old male with a past medical history significant for poorly controlled hypertension and CKD stage IV who presented to the [facility] emergency department for a dizzy/presyncopal-like episode after taking all of his antihypertensives at once in the setting of missing the majority of them over the past week.  Patient states that due to affordability issues he has not been taking his medications regularly nor has he been aggressively pursuing his health deficiencies and potential treatments as he should be. The patient endorses an approximate 30 pound weight loss over 1-2 months, anorexia with multiple food aversions.  Endorses tolerating fruit very well but being unable to tolerate milk, meat, sweet beverages, or grains.  Endorses excessive thirst but decreased urination.  He denies fever, headache, visual changes, chest pain, abdominal pain, joint aches or pains, rash.  He does endorse constant chills and generalized fatigue.  Meds:  Current Outpatient Medications  Medication Instructions  . amLODipine (NORVASC) 10 mg, Oral, Daily  . aspirin 81 mg, Oral, Daily  . atorvastatin (LIPITOR) 20 mg, Oral, Daily  . hydrALAZINE (APRESOLINE) 75 mg, Oral, 3 times daily  . isosorbide mononitrate (IMDUR) 30 mg, Oral, Daily  . labetalol (NORMODYNE) 200 mg, Oral, 2 times daily   Allergies: Allergies as of 09/13/2019  . (No  Known Allergies)   Past Medical History:  Diagnosis Date  . Hypertension   . TIA (transient ischemic attack)    Family History:  Family History  Problem Relation Age of Onset  . Stroke Mother   . Hypertension Mother   . Heart attack Father   . Cancer Sister    Social History:  Social History   Tobacco Use  . Smoking status: Former Smoker    Types: Cigarettes    Quit date: 07/02/2016    Years since quitting: 3.2  . Smokeless tobacco: Never Used  Substance Use Topics  . Alcohol use: No  . Drug use: No   Review of Systems: A complete ROS was negative except as per HPI.   Physical Exam: Blood pressure (!) 123/99, pulse 85, temperature (!) 97.5 F (36.4 C), temperature source Oral, resp. rate 20, height 5' 6.75" (1.695 m), weight 86.2 kg, SpO2 95 %. Physical Exam Constitutional:      General: He is not in acute distress.    Appearance: He is well-developed. He is not diaphoretic.  HENT:     Head: Normocephalic and atraumatic.  Eyes:     Conjunctiva/sclera: Conjunctivae normal.  Neck:     Musculoskeletal: Normal range of motion.  Cardiovascular:     Rate and Rhythm: Regular rhythm. Tachycardia present.     Heart sounds: No murmur.  Pulmonary:     Effort: Pulmonary effort is normal. No respiratory distress.     Breath sounds:  Normal breath sounds. No stridor. No wheezing.  Abdominal:     General: Bowel sounds are normal. There is no distension.     Palpations: Abdomen is soft.  Musculoskeletal:        General: Swelling present. No deformity or signs of injury.     Right lower leg: Edema present.     Left lower leg: Edema present.  Skin:    General: Skin is warm.     Capillary Refill: Capillary refill takes less than 2 seconds.  Neurological:     General: No focal deficit present.     Mental Status: He is alert and oriented to person, place, and time.  Psychiatric:        Mood and Affect: Mood normal.        Thought Content: Thought content normal.    EKG:  personally reviewed my interpretation is NSR, LVH, T-wave inversion as compared to prior  CXR: personally reviewed my interpretation is that there is mildly increased interstitial edema without blunting of the costophrenic angles.   Assessment & Plan by Problem: Principal Problem:   Acute on chronic renal failure (HCC) Active Problems:   HTN (hypertension)  Assessment: Acute on chronic renal failure.  Patient has been advised in the past that he has renal failure with prior creatinine of 5.96.  He is avoided seeking treatment or evaluation of this due to affordability issues.  He is aware that such avoidance has resulted in marked increased worsening of his kidney function.  Endorses longstanding poorly controlled severe hypertension which is likely the etiology for his renal failure.  There is some concern however, given his mothers CVA at age 38 and father's MI at age 73 but there could be a possible hereditary component to this.  Plan: Acute on chronic renal failure, ESRD: Likely secondary to hypertension.  Will likely need dialysis urgently but not emergently.  Potassium is 4.7, he is hemodynamically stable, on room air, with minimal pulmonary edema.  I have consulted nephrology, Dr. Moshe Cipro has agreed to see the patient.  I agree that he is unlikely to benefit from diuresis and will refrain from attempting this now.  Given his serum creatinine of 22.18 up from 5.96 prior, and BUN of 187 I am surprised he is mentating as well as he is.  He endorsed continued production of urine which has greatly decreased in the past 24 hours. -Consulted nephrology, appreciate recommendations -Renal diet -Renal ultrasound ordered but prior unremarkable -Telemetry monitoring -Monitor electrolytes -Repeat BUN/creatinine with renal panel confirm findings -Renal panel in a.m. -We will need vascular for dialysis catheter placement -Troponins flat and most likely secondary to his ESRD -UA,  microalbumin/creatinine, and protein creatinine ordered  Weight loss: Although likely secondary to anorexia from elevated blood urea nitrogen is certainly at risk for underlying malignancies given his lack of regular screenings.  He is in need of routine colon cancer screening.  If his appetite does not improve following improvement in his BUN I would suggest additional evaluation is indicated including CT abdomen pelvis given his renal dysfunction.  Hypertension: Poor control in the past.  Patient did take all his medications this morning.  Current BP is 123/99. -We will hold home antihypertensives given his current blood pressure and resume these as indicated.  Diet: Renal Code: Full DVT prophylaxis: Heparin Dispo: Admit patient to Inpatient with expected length of stay greater than 2 midnights.  Signed: Kathi Ludwig, MD 09/13/2019, 10:39 AM  Pager: # [phone]

## 2019-09-13 NOTE — Progress Notes (Signed)
CRITICAL VALUE ALERT  Critical Value:  Calcium 6.1  Date & Time Notied:  09/13/19 1531  Provider Notified: yes  Orders Received/Actions taken: waiting for orders. *

## 2019-09-13 NOTE — ED Notes (Signed)
Patient verbalizes understanding of discharge instructions . Opportunity for questions and answers were provided . Armband removed by staff ,Pt discharged from ED. W/C  offered at D/C  and Declined W/C at D/C and was escorted to lobby by RN.  

## 2019-09-13 NOTE — ED Notes (Addendum)
Lab called this RN with critical values Ca 6.3 and troponin 108

## 2019-09-13 NOTE — ED Triage Notes (Addendum)
Pt from home via ems; called out for witnessed syncopal episode; pt states he was standing drinking water when be became dizzy; BP 70/50 on ems arrival, diaphoretic; found sitting on floor, semi responsive;  took metoprolol this am, does not take regularly, "trying to conserve"; hx tia; stroke screen neg with ems; denies pain; denies hitting head; 500 mL bolus given PTA; pt currently A and O x 4; pt endorses loss of appetite x 1 month  BP 140/92 P 90 CBG 117 100% RA

## 2019-09-13 NOTE — Consult Note (Signed)
[facility] Renal Consultation Note  Requesting MD: Dareen Piano Indication for Consultation: ESRD  HPI:  Fred Harrison is Fred 53 y.o. male with past medical history significant for poorly controlled hypertension with resultant CKD, creatinine 5.96 1-year ago.  He has been following with [facility] and has been referred to nephrology but did not go he states due to financial reasons-and possibly some denial.  He also has an issue with affordability of his blood pressure medications thus leading to poorly controlled blood pressure.  Apparently, today, where he had been rationing his blood pressure medications he took them all at once and then had Fred presyncopal episode so came to the emergency department.  Creatinine is 22 with BUN of 180.  Ultrasound does not show any obstruction.  Therefore, this is likely ESRD.  He endorses Fred weight loss with nausea and vomiting and also is reporting facial twitching starting last night.  Interestingly, potassium is okay, hemoglobin is over 11.  Calcium is 6 3 with albumin of 3.2 so it is likely his hypocalcemia causing his facial twitch.  I have told him that it is basically time to start dialysis.  He is accepting of this.    Creat  Date/Time Value Ref Range Status  08/13/2016 02:59 PM 2.92 (H) 0.70 - 1.33 mg/dL Final    Comment:      For patients > or = 55 years of age: The upper reference limit for Creatinine is approximately 13% higher for people identified as African-American.      Creatinine, Ser  Date/Time Value Ref Range Status  09/13/2019 07:38 AM 22.18 (H) 0.61 - 1.24 mg/dL Final  09/06/2018 12:13 PM 5.96 (H) 0.76 - 1.27 mg/dL Final  01/05/2018 10:09 AM 2.70 (H) 0.76 - 1.27 mg/dL Final  06/29/2017 09:03 AM 3.55 (H) 0.76 - 1.27 mg/dL Final  03/17/2017 02:20 AM 2.55 (H) 0.61 - 1.24 mg/dL Final  08/05/2016 09:10 AM 2.55 (H) 0.61 - 1.24 mg/dL Final  08/04/2016 10:04 AM 2.47 (H) 0.61 - 1.24 mg/dL Final  08/04/2016 03:40 AM  2.48 (H) 0.61 - 1.24 mg/dL Final  08/03/2016 08:40 AM 2.75 (H) 0.61 - 1.24 mg/dL Final     PMHx:   Past Medical History:  Diagnosis Date  . Hypertension   . TIA (transient ischemic attack)     History reviewed. No pertinent surgical history.  Family Hx:  Family History  Problem Relation Age of Onset  . Stroke Mother   . Hypertension Mother   . Heart attack Father   . Cancer Sister     Social History:  reports that he quit smoking about 3 years ago. His smoking use included cigarettes. He has never used smokeless tobacco. He reports that he does not drink alcohol or use drugs.  Allergies: No Known Allergies  Medications: Prior to Admission medications   Medication Sig Start Date End Date Taking? Authorizing Provider  amLODipine (NORVASC) 10 MG tablet Take 1 tablet (10 mg total) by mouth daily. 05/20/19  Yes Ladell Pier, MD  aspirin 81 MG tablet Take 1 tablet (81 mg total) by mouth daily. 03/17/17  Yes [name], MD  atorvastatin (LIPITOR) 20 MG tablet Take 1 tablet (20 mg total) by mouth daily. 05/18/19  Yes Ladell Pier, MD  hydrALAZINE (APRESOLINE) 50 MG tablet Take 1.5 tablets (75 mg total) by mouth 3 (three) times daily. 05/18/19  Yes Ladell Pier, MD  isosorbide mononitrate (IMDUR) 30 MG 24 hr tablet Take 1 tablet (30  mg total) by mouth daily. 05/20/19  Yes Ladell Pier, MD  labetalol (NORMODYNE) 200 MG tablet Take 1 tablet (200 mg total) by mouth 2 (two) times daily. 05/20/19  Yes Ladell Pier, MD    I have reviewed the patient's current medications.  Labs:  Results for orders placed or performed during the hospital encounter of 09/13/19 (from the past 48 hour(s))  CBC with Differential/Platelet     Status: Abnormal   Collection Time: 09/13/19  7:38 AM  Result Value Ref Range   WBC 8.7 4.0 - 10.5 K/uL   RBC 4.66 4.22 - 5.81 MIL/uL   Hemoglobin 11.4 (L) 13.0 - 17.0 g/dL   HCT 35.5 (L) 39.0 - 52.0 %   MCV 76.2 (L) 80.0 - 100.0 fL   MCH  24.5 (L) 26.0 - 34.0 pg   MCHC 32.1 30.0 - 36.0 g/dL   RDW 15.0 11.5 - 15.5 %   Platelets 332 150 - 400 K/uL   nRBC 0.0 0.0 - 0.2 %   Neutrophils Relative % 85 %   Neutro Abs 7.5 1.7 - 7.7 K/uL   Lymphocytes Relative 5 %   Lymphs Abs 0.4 (L) 0.7 - 4.0 K/uL   Monocytes Relative 5 %   Monocytes Absolute 0.4 0.1 - 1.0 K/uL   Eosinophils Relative 3 %   Eosinophils Absolute 0.2 0.0 - 0.5 K/uL   Basophils Relative 1 %   Basophils Absolute 0.1 0.0 - 0.1 K/uL   Immature Granulocytes 1 %   Abs Immature Granulocytes 0.04 0.00 - 0.07 K/uL    Comment: Performed at [facility], [address], [place], [place] [address]  Comprehensive metabolic panel     Status: Abnormal   Collection Time: 09/13/19  7:38 AM  Result Value Ref Range   Sodium 140 135 - 145 mmol/L   Potassium 4.6 3.5 - 5.1 mmol/L   Chloride 103 98 - 111 mmol/L   CO2 13 (L) 22 - 32 mmol/L   Glucose, Bld 127 (H) 70 - 99 mg/dL   BUN 187 (H) 6 - 20 mg/dL   Creatinine, Ser 22.18 (H) 0.61 - 1.24 mg/dL   Calcium 6.3 (LL) 8.9 - 10.3 mg/dL    Comment: CRITICAL RESULT CALLED TO, READ BACK BY AND VERIFIED WITHLisette Abu RN [id number] BY Fred Harrison    Total Protein 6.8 6.5 - 8.1 g/dL   Albumin 3.2 (L) 3.5 - 5.0 g/dL   AST 11 (L) 15 - 41 U/L   ALT 10 0 - 44 U/L   Alkaline Phosphatase 54 38 - 126 U/L   Total Bilirubin 0.8 0.3 - 1.2 mg/dL   GFR calc non Af Amer 2 (L) >60 mL/min   GFR calc Af Amer 2 (L) >60 mL/min   Anion gap 24 (H) 5 - 15    Comment: Performed at [facility], [place] [address] [place], [place] [address]  Troponin I (High Sensitivity)     Status: Abnormal   Collection Time: 09/13/19  7:38 AM  Result Value Ref Range   Troponin I (High Sensitivity) 108 (HH) <18 ng/L    Comment: CRITICAL RESULT CALLED TO, READ BACK BY AND VERIFIED WITH: Fred Abu RN [phone] [id number] BY Fred Harrison (NOTE) Elevated high sensitivity troponin I (hsTnI) values and significant  changes across serial measurements may suggest ACS but  many other  chronic and acute conditions are known to elevate hsTnI results.  Refer to the Links section for chest pain  algorithms and additional  guidance. Performed at [facility], [place] [address], [place], [place] [address]   Magnesium     Status: None   Collection Time: 09/13/19  7:38 AM  Result Value Ref Range   Magnesium 2.0 1.7 - 2.4 mg/dL    Comment: Performed at [facility] [address], [place], [place] [address]  Troponin I (High Sensitivity)     Status: Abnormal   Collection Time: 09/13/19  9:32 AM  Result Value Ref Range   Troponin I (High Sensitivity) 106 (HH) <18 ng/L    Comment: CRITICAL VALUE NOTED.  VALUE IS CONSISTENT WITH PREVIOUSLY REPORTED AND CALLED VALUE. (NOTE) Elevated high sensitivity troponin I (hsTnI) values and significant  changes across serial measurements may suggest ACS but many other  chronic and acute conditions are known to elevate hsTnI results.  Refer to the Links section for chest pain algorithms and additional  guidance. Performed at [facility], [place] [address], [place], [place] [address]      ROS:  Fred comprehensive review of systems was negative except for: Constitutional: positive for fatigue Gastrointestinal: positive for nausea and vomiting Facial twitching  Physical Exam: Vitals:   09/13/19 1045 09/13/19 1200  BP: (!) 130/92 (!) 130/92  Pulse: 99 99  Resp: 16 17  Temp:    SpO2: 97% 96%     General: Fairly well-appearing male.  He does have obvious facial twitching HEENT: Pupils are equally round and reactive to light, extraocular motions are intact, mucous membranes are moist Neck: no JVD Heart: RRR Lungs: mostly clear Abdomen: soft, non tender Extremities: minimal edema Skin: warm and dry Neuro: alert, facial twitching   Assessment/Plan: 53 year old Hispanic male with poorly controlled hypertension and advanced CKD noted in the past.  He now is at ESRD with creatinine of 22 1.Renal-creatinine  of six 1 year ago.  Now creatinine of 22 with BUN of 180 and uremic symptoms.  There is no obstruction on his ultrasound.  This will not be reversible, he is now ESRD.  I explained to him what this means.  He seems to have some idea.  We will try to get Fred tunneled dialysis catheter when we can in the next 24 hours to go ahead and initiate hemodialysis.  We will plan for Fred few treatments in Fred row.  Will consult vascular for AV fistula and initiate clip for outpatient dialysis.  He lives in [place] 2. Hypertension/volume  -right now blood pressure is not bad.  He is Fred little bit volume overloaded.  No blood pressure medications ordered at this time.  We can add back as needed 3.  Metabolic acidosis-should correct with dialysis 4. Anemia  -interesting that this is not more advanced.  Will check iron stores and initiate ESA when needed 5.  Bones-I think his facial twitching is from hypocalcemia, will give him some IV repletion.  Will check PTH and Phos and act as needed   Louis Meckel 09/13/2019, 1:12 PM

## 2019-09-13 NOTE — Consult Note (Signed)
Chief Complaint: Patient was seen in consultation today for a tunneled HD catheter placement.  Referring Physician(s): Dr. Moshe Cipro  Supervising Physician: Aletta Edouard  Patient Status: [facility] - ED  History of Present Illness: Fred Harrison is a 53 y.o. male with a past medical history significant for scalp mass, TIA, HLD, poorly controlled HTN and CKD who presented to [facility] ED this morning with complaints of syncope after taking his medications this morning. He also reports that he began to have facial spasms last night which he had never had before. Initial workup in the ED showed his hgb to be 11.4 (previously 17.5 one year ago), calcium 6.3 and creatinine 22.18 (previously 5.96 one year ago). He is being admitted for further evaluation and management. Nephrology has been consulted with plans to begin dialysis during this hospital admission - IR has been requested to place a tunneled HD catheter to initiate dialysis tomorrow.  Fred Harrison reports that he feels ok right now, he is eating lunch during our visit. He states that he sometimes doesn't take his blood pressure medications because it's hard to afford them but he tries his best to not skip too many doses. He states that he has not been eating or drinking very much at home because he doesn't feel hungry - he denies any nausea, vomiting or abdominal pain. He states that his face/jaw began to spasm/twitch last night out of nowhere which was concerning to him but he was hopeful it would go away after he slept. He understands that he will need dialysis and understands the need for [facility] placement which he wishes to proceed with.    Past Medical History:  Diagnosis Date  . Hypertension   . TIA (transient ischemic attack)     History reviewed. No pertinent surgical history.  Allergies: Patient has no known allergies.  Medications: Prior to Admission medications   Medication Sig Start Date End Date Taking? Authorizing Provider   amLODipine (NORVASC) 10 MG tablet Take 1 tablet (10 mg total) by mouth daily. 05/20/19  Yes Ladell Pier, MD  aspirin 81 MG tablet Take 1 tablet (81 mg total) by mouth daily. 03/17/17  Yes [name], MD  atorvastatin (LIPITOR) 20 MG tablet Take 1 tablet (20 mg total) by mouth daily. 05/18/19  Yes Ladell Pier, MD  hydrALAZINE (APRESOLINE) 50 MG tablet Take 1.5 tablets (75 mg total) by mouth 3 (three) times daily. 05/18/19  Yes Ladell Pier, MD  isosorbide mononitrate (IMDUR) 30 MG 24 hr tablet Take 1 tablet (30 mg total) by mouth daily. 05/20/19  Yes Ladell Pier, MD  labetalol (NORMODYNE) 200 MG tablet Take 1 tablet (200 mg total) by mouth 2 (two) times daily. 05/20/19  Yes Ladell Pier, MD     Family History  Problem Relation Age of Onset  . Stroke Mother   . Hypertension Mother   . Heart attack Father   . Cancer Sister     Social History   Socioeconomic History  . Marital status: Divorced    Spouse name: Not on file  . Number of children: Not on file  . Years of education: Not on file  . Highest education level: Not on file  Occupational History  . Not on file  Social Needs  . Financial resource strain: Not on file  . Food insecurity    Worry: Not on file    Inability: Not on file  . Transportation needs    Medical: Not on file  Non-medical: Not on file  Tobacco Use  . Smoking status: Former Smoker    Types: Cigarettes    Quit date: 07/02/2016    Years since quitting: 3.2  . Smokeless tobacco: Never Used  Substance and Sexual Activity  . Alcohol use: No  . Drug use: No  . Sexual activity: Not on file  Lifestyle  . Physical activity    Days per week: Not on file    Minutes per session: Not on file  . Stress: Not on file  Relationships  . Social Herbalist on phone: Not on file    Gets together: Not on file    Attends religious service: Not on file    Active member of club or organization: Not on file    Attends meetings  of clubs or organizations: Not on file    Relationship status: Not on file  Other Topics Concern  . Not on file  Social History Narrative  . Not on file     Review of Systems: A 12 point ROS discussed and pertinent positives are indicated in the HPI above.  All other systems are negative.  Review of Systems  Constitutional: Positive for appetite change and fatigue. Negative for chills and fever.  Respiratory: Negative for cough and shortness of breath.   Cardiovascular: Negative for chest pain.  Gastrointestinal: Negative for abdominal pain, diarrhea, nausea and vomiting.  Musculoskeletal: Negative for back pain.  Skin: Negative for rash and wound.  Neurological: Positive for syncope (this morning). Negative for dizziness and headaches.       (+) facial spasms/twitching    Vital Signs: BP (!) 137/101   Pulse (!) 154   Temp (!) 97.5 F (36.4 C) (Oral)   Resp 17   Ht 5' 6.75" (1.695 m)   Wt 190 lb (86.2 kg)   SpO2 (!) 89%   BMI 29.98 kg/m   Physical Exam Vitals signs and nursing note reviewed.  Constitutional:      General: He is not in acute distress. HENT:     Head: Normocephalic.     Mouth/Throat:     Mouth: Mucous membranes are moist.     Pharynx: Oropharynx is clear. No oropharyngeal exudate or posterior oropharyngeal erythema.  Cardiovascular:     Rate and Rhythm: Regular rhythm. Tachycardia present.  Pulmonary:     Effort: Pulmonary effort is normal.     Breath sounds: Normal breath sounds.  Abdominal:     General: Bowel sounds are normal. There is no distension.     Palpations: Abdomen is soft.     Tenderness: There is no abdominal tenderness.  Skin:    General: Skin is warm and dry.     Comments: (+) mass on scalp   Neurological:     Mental Status: He is alert and oriented to person, place, and time.     Comments: (+) involuntary spasms/twitching of jaw/mouth throughout exam  Psychiatric:        Mood and Affect: Mood normal.        Behavior: Behavior  normal.        Thought Content: Thought content normal.        Judgment: Judgment normal.      MD Evaluation Airway: WNL Heart: WNL Abdomen: WNL Chest/ Lungs: WNL ASA  Classification: 3 Mallampati/Airway Score: Two   Imaging: US Renal  Result Date: 09/13/2019 CLINICAL DATA:  53 year old male with acute chronic renal failure. EXAM: RENAL / URINARY TRACT ULTRASOUND COMPLETE COMPARISON:  Renal ultrasound dated 01/07/2018 FINDINGS: Right Kidney: Renal measurements: 9.8 x 4.0 x 5.5 cm = volume: 172 mL. There is diffuse increased renal echogenicity. No hydronephrosis or shadowing stone. There is a 1.2 x 1.3 x 1.4 cm interpolar cyst. Left Kidney: Renal measurements: 9.8 x 6.1 x 4.9 cm = volume: 152 mL. Diffuse echogenic kidney. There is a 2.8 x 1.8 x 2.2 cm upper pole cyst and a 1.3 x 1.3 x 1.1 cm lower pole cyst. No hydronephrosis or shadowing stone. Bladder: Appears normal for degree of bladder distention. Other: Prostate gland measures 4.3 x 3.5 x 4.1 cm. IMPRESSION: 1. Echogenic kidneys, likely related to underlying medical renal disease. No hydronephrosis or shadowing stone. 2. Bilateral renal cysts. Electronically Signed   By: Anner Crete M.D.   On: 09/13/2019 11:20   Dg Chest Port 1 View  Result Date: 09/13/2019 CLINICAL DATA:  Syncope EXAM: PORTABLE CHEST 1 VIEW COMPARISON:  08/03/2016 FINDINGS: Mild cardiomegaly and vascular congestion. No confluent opacities or effusions. No acute bony abnormality. IMPRESSION: Cardiomegaly, vascular congestion. Electronically Signed   By: Rolm Baptise M.D.   On: 09/13/2019 08:03    Labs:  CBC: Recent Labs    09/13/19 0738  WBC 8.7  HGB 11.4*  HCT 35.5*  PLT 332    COAGS: No results for input(s): INR, APTT in the last 8760 hours.  BMP: Recent Labs    09/13/19 0738  NA 140  K 4.6  CL 103  CO2 13*  GLUCOSE 127*  BUN 187*  CALCIUM 6.3*  CREATININE 22.18*  GFRNONAA 2*  GFRAA 2*    LIVER FUNCTION TESTS: Recent Labs     09/13/19 0738  BILITOT 0.8  AST 11*  ALT 10  ALKPHOS 54  PROT 6.8  ALBUMIN 3.2*    TUMOR MARKERS: No results for input(s): AFPTM, CEA, CA199, CHROMGRNA in the last 8760 hours.  Assessment and Plan:  53 y/o M with history of poorly controlled HTN and CKD who presented to [facility] ED this morning after a syncopal episode at home. He was found to have a creatinine of 22.18 and was seen by nephrology who plans to begin HD tomorrow. Request has been made to IR for placement of tunneled HD catheter.  Will plan for [facility] placement tomorrow in IR - I have placed orders for patient to be NPO after midnight tonight, hold heparin until post procedure, AM labs. IR will call for patient when ready.  Risks and benefits discussed with the patient including, but not limited to bleeding, infection, vascular injury, pneumothorax which may require chest tube placement, air embolism or even death.  All of the patient's questions were answered, patient is agreeable to proceed.  Consent signed and in IR control room.  Thank you for this interesting consult.  I greatly enjoyed meeting Damarco Overcash and look forward to participating in their care.  A copy of this report was sent to the requesting provider on this date.  Electronically Signed: Joaquim Nam, PA-C 09/13/2019, 2:17 PM   I spent a total of 20 Minutes  in face to face in clinical consultation, greater than 50% of which was counseling/coordinating care for tunneled HD catheter placement.

## 2019-09-13 NOTE — ED Provider Notes (Signed)
[facility] EMERGENCY DEPARTMENT Provider Note   CSN: [id number] Arrival date & time: 09/13/19  0719     History   Chief Complaint Chief Complaint  Patient presents with  . Loss of Consciousness  . Hypotension    HPI Fred Harrison is a 53 y.o. male.     The history is provided by the patient. No language interpreter was used.  Loss of Consciousness Episode history:  Single Most recent episode:  Today Duration:  8 hours Timing:  Constant Progression:  Worsening Chronicity:  New Context: standing up   Relieved by:  Nothing Worsened by:  Nothing Ineffective treatments:  None tried Associated symptoms: nausea, shortness of breath and weakness   Pt reports he passed out today.  Pt reports he has renal failure and hypertension.  Pt reports he has lost approx 30 pounds of weight in the last 2 months   Past Medical History:  Diagnosis Date  . Hypertension   . TIA (transient ischemic attack)     Patient Active Problem List   Diagnosis Date Noted  . Scalp mass 12/01/2017  . CKD stage 4 secondary to hypertension ([place]) 12/01/2017  . Elevated hemoglobin ([place]) 08/13/2016  . Hypokalemia   . TIA (transient ischemic attack) 08/03/2016  . HTN (hypertension) 08/03/2016    History reviewed. No pertinent surgical history.      Home Medications    Prior to Admission medications   Medication Sig Start Date End Date Taking? Authorizing Provider  amLODipine (NORVASC) 10 MG tablet Take 1 tablet (10 mg total) by mouth daily. 05/20/19   Ladell Pier, MD  aspirin 81 MG tablet Take 1 tablet (81 mg total) by mouth daily. 03/17/17   [name], MD  atorvastatin (LIPITOR) 20 MG tablet Take 1 tablet (20 mg total) by mouth daily. 05/18/19   Ladell Pier, MD  hydrALAZINE (APRESOLINE) 50 MG tablet Take 1.5 tablets (75 mg total) by mouth 3 (three) times daily. 05/18/19   Ladell Pier, MD  isosorbide mononitrate (IMDUR) 30 MG 24 hr tablet Take 1 tablet  (30 mg total) by mouth daily. 05/20/19   Ladell Pier, MD  labetalol (NORMODYNE) 200 MG tablet Take 1 tablet (200 mg total) by mouth 2 (two) times daily. 05/20/19   Ladell Pier, MD    Family History Family History  Problem Relation Age of Onset  . Stroke Mother   . Hypertension Mother   . Heart attack Father   . Cancer Sister     Social History Social History   Tobacco Use  . Smoking status: Former Smoker    Types: Cigarettes    Quit date: 07/02/2016    Years since quitting: 3.2  . Smokeless tobacco: Never Used  Substance Use Topics  . Alcohol use: No  . Drug use: No     Allergies   Patient has no known allergies.   Review of Systems Review of Systems  Respiratory: Positive for shortness of breath.   Cardiovascular: Positive for syncope.  Gastrointestinal: Positive for nausea.  Neurological: Positive for weakness.  All other systems reviewed and are negative.    Physical Exam Updated Vital Signs BP (!) 123/99   Pulse 85   Temp (!) 97.5 F (36.4 C) (Oral)   Resp 20   Ht 5' 6.75" (1.695 m)   Wt 86.2 kg   SpO2 95%   BMI 29.98 kg/m   Physical Exam Vitals signs and nursing note reviewed.  Constitutional:  Appearance: Normal appearance. He is well-developed.  HENT:     Head: Normocephalic.     Nose: Nose normal.     Mouth/Throat:     Mouth: Mucous membranes are moist.  Eyes:     Pupils: Pupils are equal, round, and reactive to light.  Neck:     Musculoskeletal: Normal range of motion.  Cardiovascular:     Rate and Rhythm: Normal rate.  Pulmonary:     Effort: Pulmonary effort is normal.  Abdominal:     General: Abdomen is flat. There is no distension.  Musculoskeletal: Normal range of motion.  Neurological:     General: No focal deficit present.     Mental Status: He is alert and oriented to person, place, and time.  Psychiatric:        Mood and Affect: Mood normal.      ED Treatments / Results  Labs (all labs ordered are  listed, but only abnormal results are displayed) Labs Reviewed  CBC WITH DIFFERENTIAL/PLATELET - Abnormal; Notable for the following components:      Result Value   Hemoglobin 11.4 (*)    HCT 35.5 (*)    MCV 76.2 (*)    MCH 24.5 (*)    Lymphs Abs 0.4 (*)    All other components within normal limits  COMPREHENSIVE METABOLIC PANEL - Abnormal; Notable for the following components:   CO2 13 (*)    Glucose, Bld 127 (*)    BUN 187 (*)    Creatinine, Ser 22.18 (*)    Calcium 6.3 (*)    Albumin 3.2 (*)    AST 11 (*)    GFR calc non Af Amer 2 (*)    GFR calc Af Amer 2 (*)    Anion gap 24 (*)    All other components within normal limits  TROPONIN I (HIGH SENSITIVITY) - Abnormal; Notable for the following components:   Troponin I (High Sensitivity) 108 (*)    All other components within normal limits  SARS CORONAVIRUS 2 (TAT 6-24 HRS)  URINALYSIS, ROUTINE W REFLEX MICROSCOPIC  TROPONIN I (HIGH SENSITIVITY)    EKG EKG Interpretation  Date/Time:  Tuesday September 13 2019 [id number] EDT Ventricular Rate:  86 PR Interval:    QRS Duration: 110 QT Interval:  468 QTC Calculation: 560 R Axis:   -46 Text Interpretation:  Sinus rhythm Biatrial enlargement LAD, consider left anterior fascicular block Left ventricular hypertrophy Anterior Q waves, possibly due to LVH Abnormal T, consider ischemia, diffuse leads Prolonged QT interval flipped t wave in lateral and inferior leads not seen on prior Otherwise no significant change Confirmed by Deno Etienne [phone]) on 09/13/2019 7:33:41 AM   Radiology Dg Chest Port 1 View  Result Date: 09/13/2019 CLINICAL DATA:  Syncope EXAM: PORTABLE CHEST 1 VIEW COMPARISON:  08/03/2016 FINDINGS: Mild cardiomegaly and vascular congestion. No confluent opacities or effusions. No acute bony abnormality. IMPRESSION: Cardiomegaly, vascular congestion. Electronically Signed   By: Rolm Baptise M.D.   On: 09/13/2019 08:03    Procedures Procedures (including critical care  time)  Medications Ordered in ED Medications - No data to display   Initial Impression / Assessment and Plan / ED Course  I have reviewed the triage vital signs and the nursing notes.  Pertinent labs & imaging results that were available during my care of the patient were reviewed by me and considered in my medical decision making (see chart for details).  Clinical Course as of Sep 12 929  Tue Sep 13, 2019  [place] 1 View [WF]  [phone] CBC with Differential/Platelet(!) [WF]    Clinical Course User Index [WF] Jerel Shepherd       MDM   Pt has elevated creat to 22 and BUN of187 C02 is 13 I spoke with the Internal medicine resident who will see for admission  Final Clinical Impressions(s) / ED Diagnoses   Final diagnoses:  Acute kidney injury [facility])  Syncope and collapse    ED Discharge Orders    None       Fransico Meadow, [place] 09/13/19 [place], [place], DO 09/13/19 [phone]

## 2019-09-13 NOTE — ED Notes (Signed)
ED TO INPATIENT HANDOFF REPORT  ED Nurse Name and Phone #:   S Name/Age/Gender Fred Harrison 53 y.o. male Room/Bed: 011C/011C  Code Status   Code Status: Full Code  Home/SNF/Other Home Patient oriented to: self, place, time and situation Is this baseline? Yes   Triage Complete: Triage complete  Chief Complaint sick  Triage Note Pt from home via ems; called out for witnessed syncopal episode; pt states he was standing drinking water when be became dizzy; BP 70/50 on ems arrival, diaphoretic; found sitting on floor, semi responsive;  took metoprolol this am, does not take regularly, "trying to conserve"; hx tia; stroke screen neg with ems; denies pain; denies hitting head; 500 mL bolus given PTA; pt currently A and O x 4; pt endorses loss of appetite x 1 month  BP 140/92 P 90 CBG 117 100% RA    Allergies No Known Allergies  Level of Care/Admitting Diagnosis ED Disposition    ED Disposition Condition [place]: [place] [[id number]]  Level of Care: Telemetry Medical [104]  Covid Evaluation: Asymptomatic Screening Protocol (No Symptoms)  Diagnosis: Acute on chronic renal failure [id number]  Admitting Physician: Aldine Contes [phone]  Attending Physician: Aldine Contes [phone]  Estimated length of stay: past midnight tomorrow  Certification:: I certify this patient will need inpatient services for at least 2 midnights  PT Class (Do Not Modify): Inpatient [101]  PT Acc Code (Do Not Modify): Private [1]       B Medical/Surgery History Past Medical History:  Diagnosis Date  . Hypertension   . TIA (transient ischemic attack)    History reviewed. No pertinent surgical history.   A IV Location/Drains/Wounds Patient Lines/Drains/Airways Status   Active Line/Drains/Airways    Name:   Placement date:   Placement time:   Site:   Days:   Peripheral IV 09/13/19 Anterior;Left Hand   09/13/19    -    Hand   less  than 1          Intake/Output Last 24 hours No intake or output data in the 24 hours ending 09/13/19 1449  Labs/Imaging Results for orders placed or performed during the hospital encounter of 09/13/19 (from the past 48 hour(s))  CBC with Differential/Platelet     Status: Abnormal   Collection Time: 09/13/19  7:38 AM  Result Value Ref Range   WBC 8.7 4.0 - 10.5 K/uL   RBC 4.66 4.22 - 5.81 MIL/uL   Hemoglobin 11.4 (L) 13.0 - 17.0 g/dL   HCT 35.5 (L) 39.0 - 52.0 %   MCV 76.2 (L) 80.0 - 100.0 fL   MCH 24.5 (L) 26.0 - 34.0 pg   MCHC 32.1 30.0 - 36.0 g/dL   RDW 15.0 11.5 - 15.5 %   Platelets 332 150 - 400 K/uL   nRBC 0.0 0.0 - 0.2 %   Neutrophils Relative % 85 %   Neutro Abs 7.5 1.7 - 7.7 K/uL   Lymphocytes Relative 5 %   Lymphs Abs 0.4 (L) 0.7 - 4.0 K/uL   Monocytes Relative 5 %   Monocytes Absolute 0.4 0.1 - 1.0 K/uL   Eosinophils Relative 3 %   Eosinophils Absolute 0.2 0.0 - 0.5 K/uL   Basophils Relative 1 %   Basophils Absolute 0.1 0.0 - 0.1 K/uL   Immature Granulocytes 1 %   Abs Immature Granulocytes 0.04 0.00 - 0.07 K/uL    Comment: Performed at [facility], [address] [place], [place] [address]  Comprehensive metabolic panel     Status: Abnormal   Collection Time: 09/13/19  7:38 AM  Result Value Ref Range   Sodium 140 135 - 145 mmol/L   Potassium 4.6 3.5 - 5.1 mmol/L   Chloride 103 98 - 111 mmol/L   CO2 13 (L) 22 - 32 mmol/L   Glucose, Bld 127 (H) 70 - 99 mg/dL   BUN 187 (H) 6 - 20 mg/dL   Creatinine, Ser 22.18 (H) 0.61 - 1.24 mg/dL   Calcium 6.3 (LL) 8.9 - 10.3 mg/dL    Comment: CRITICAL RESULT CALLED TO, READ BACK BY AND VERIFIED WITHLisette Abu RN [id number] BY A BENNETT    Total Protein 6.8 6.5 - 8.1 g/dL   Albumin 3.2 (L) 3.5 - 5.0 g/dL   AST 11 (L) 15 - 41 U/L   ALT 10 0 - 44 U/L   Alkaline Phosphatase 54 38 - 126 U/L   Total Bilirubin 0.8 0.3 - 1.2 mg/dL   GFR calc non Af Amer 2 (L) >60 mL/min   GFR calc Af Amer 2 (L) >60 mL/min    Anion gap 24 (H) 5 - 15    Comment: Performed at [facility], [place] [address] [place], [place] [address]  Troponin I (High Sensitivity)     Status: Abnormal   Collection Time: 09/13/19  7:38 AM  Result Value Ref Range   Troponin I (High Sensitivity) 108 (HH) <18 ng/L    Comment: CRITICAL RESULT CALLED TO, READ BACK BY AND VERIFIED WITH: Lisette Abu RN [phone] [id number] BY A BENNETT (NOTE) Elevated high sensitivity troponin I (hsTnI) values and significant  changes across serial measurements may suggest ACS but many other  chronic and acute conditions are known to elevate hsTnI results.  Refer to the Links section for chest pain algorithms and additional  guidance. Performed at [facility], [place] [address] [place], [place] [address]   Magnesium     Status: None   Collection Time: 09/13/19  7:38 AM  Result Value Ref Range   Magnesium 2.0 1.7 - 2.4 mg/dL    Comment: Performed at [facility] [address], [place], [place] [address]  Troponin I (High Sensitivity)     Status: Abnormal   Collection Time: 09/13/19  9:32 AM  Result Value Ref Range   Troponin I (High Sensitivity) 106 (HH) <18 ng/L    Comment: CRITICAL VALUE NOTED.  VALUE IS CONSISTENT WITH PREVIOUSLY REPORTED AND CALLED VALUE. (NOTE) Elevated high sensitivity troponin I (hsTnI) values and significant  changes across serial measurements may suggest ACS but many other  chronic and acute conditions are known to elevate hsTnI results.  Refer to the Links section for chest pain algorithms and additional  guidance. Performed at [facility], [place] [address] [place], [place] [address]    US Renal  Result Date: 09/13/2019 CLINICAL DATA:  53 year old male with acute chronic renal failure. EXAM: RENAL / URINARY TRACT ULTRASOUND COMPLETE COMPARISON:  Renal ultrasound dated 01/07/2018 FINDINGS: Right Kidney: Renal measurements: 9.8 x 4.0 x 5.5 cm = volume: 172 mL. There is diffuse increased renal  echogenicity. No hydronephrosis or shadowing stone. There is a 1.2 x 1.3 x 1.4 cm interpolar cyst. Left Kidney: Renal measurements: 9.8 x 6.1 x 4.9 cm = volume: 152 mL. Diffuse echogenic kidney. There is a 2.8 x 1.8 x 2.2 cm upper pole cyst and a 1.3 x 1.3 x 1.1  cm lower pole cyst. No hydronephrosis or shadowing stone. Bladder: Appears normal for degree of bladder distention. Other: Prostate gland measures 4.3 x 3.5 x 4.1 cm. IMPRESSION: 1. Echogenic kidneys, likely related to underlying medical renal disease. No hydronephrosis or shadowing stone. 2. Bilateral renal cysts. Electronically Signed   By: Anner Crete M.D.   On: 09/13/2019 11:20   Dg Chest Port 1 View  Result Date: 09/13/2019 CLINICAL DATA:  Syncope EXAM: PORTABLE CHEST 1 VIEW COMPARISON:  08/03/2016 FINDINGS: Mild cardiomegaly and vascular congestion. No confluent opacities or effusions. No acute bony abnormality. IMPRESSION: Cardiomegaly, vascular congestion. Electronically Signed   By: Rolm Baptise M.D.   On: 09/13/2019 08:03    Pending Labs Unresulted Labs (From admission, onward)    Start     Ordered   09/14/19 0500  CBC  Tomorrow morning,   R     09/13/19 1024   09/14/19 0500  Parathyroid hormone, intact (no Ca)  Tomorrow morning,   R     09/13/19 1329   09/14/19 0500  Renal function panel  Tomorrow morning,   R     09/13/19 1329   09/14/19 0500  Iron and TIBC  Tomorrow morning,   R     09/13/19 1329   09/14/19 0500  Ferritin  Tomorrow morning,   R     09/13/19 1329   09/14/19 0500  Protime-INR  Tomorrow morning,   R     09/13/19 1428   09/13/19 1430  Renal function panel  Once,   STAT     09/13/19 1430   09/13/19 1245  Renal function panel  Daily,   R     09/13/19 1025   09/13/19 1059  Microalbumin / creatinine urine ratio  Once,   R     09/13/19 1059   09/13/19 1034  SARS CORONAVIRUS 2 (TAT 6-24 HRS) Nasopharyngeal Nasopharyngeal Swab  (Asymptomatic/Tier 2)  Once,   STAT    Question Answer Comment  Is this test  for diagnosis or screening Screening   Symptomatic for COVID-19 as defined by CDC No   Hospitalized for COVID-19 No   Admitted to ICU for COVID-19 No   Previously tested for COVID-19 No   Resident in a congregate (group) care setting No   Employed in healthcare setting No      09/13/19 1034   09/13/19 1026  Protein / creatinine ratio, urine  Once,   STAT     09/13/19 1025   09/13/19 1023  HIV Antibody (routine testing w rflx)  (HIV Antibody (Routine testing w reflex) panel)  Once,   STAT     09/13/19 1024   09/13/19 0754  Urinalysis, Routine w reflex microscopic  Once,   STAT     09/13/19 0753   Signed and Held  Hepatitis B surface antigen  (New Patient [facility] (Hepatitis B))  Once,   R     Signed and Held   Signed and Held  Hepatitis B surface antibody  (New Patient [facility] (Hepatitis B))  Once,   R     Signed and Held   Signed and Held  Hepatitis B core antibody, total  (New Patient [facility] (Hepatitis B))  Once,   R     Signed and Held   Signed and Held  Hepatitis C antibody  (New Patient [facility] (Hepatitis B))  Once,   R     Signed and Held  Vitals/Pain Today's Vitals   09/13/19 1245 09/13/19 1300 09/13/19 1315 09/13/19 1430  BP: (!) 133/96 (!) 130/95 (!) 137/101 (!) 140/101  Pulse: (!) 30 (!) 154  (!) 47  Resp:      Temp:      TempSrc:      SpO2: 95% (!) 89%  94%  Weight:      Height:      PainSc:        Isolation Precautions No active isolations  Medications Medications  acetaminophen (TYLENOL) tablet 650 mg (has no administration in time range)    Or  acetaminophen (TYLENOL) suppository 650 mg (has no administration in time range)  calcium gluconate 1 g/ 50 mL sodium chloride IVPB (1,000 mg Intravenous New Bag/Given 09/13/19 1447)  Chlorhexidine Gluconate Cloth 2 % PADS 6 each (has no administration in time range)  heparin injection 5,000 Units (has no administration in time range)    Mobility walks Moderate fall risk   Focused  Assessments Renal Assessment Handoff:  Hemodialysis Schedule:  Last Hemodialysis date and time:    Restricted appendage:      R Recommendations: See Admitting Provider Note  Report given to:   Additional Notes:

## 2019-09-14 ENCOUNTER — Encounter (HOSPITAL_COMMUNITY): Payer: Self-pay | Admitting: Interventional Radiology

## 2019-09-14 ENCOUNTER — Inpatient Hospital Stay (HOSPITAL_COMMUNITY): Payer: PRIVATE HEALTH INSURANCE

## 2019-09-14 DIAGNOSIS — R55 Syncope and collapse: Secondary | ICD-10-CM

## 2019-09-14 DIAGNOSIS — I517 Cardiomegaly: Secondary | ICD-10-CM

## 2019-09-14 DIAGNOSIS — N186 End stage renal disease: Secondary | ICD-10-CM

## 2019-09-14 DIAGNOSIS — Z79899 Other long term (current) drug therapy: Secondary | ICD-10-CM

## 2019-09-14 DIAGNOSIS — I959 Hypotension, unspecified: Secondary | ICD-10-CM

## 2019-09-14 DIAGNOSIS — N179 Acute kidney failure, unspecified: Principal | ICD-10-CM

## 2019-09-14 DIAGNOSIS — E872 Acidosis: Secondary | ICD-10-CM

## 2019-09-14 DIAGNOSIS — I120 Hypertensive chronic kidney disease with stage 5 chronic kidney disease or end stage renal disease: Secondary | ICD-10-CM

## 2019-09-14 DIAGNOSIS — N185 Chronic kidney disease, stage 5: Secondary | ICD-10-CM

## 2019-09-14 HISTORY — PX: IR US GUIDE VASC ACCESS RIGHT: IMG2390

## 2019-09-14 HISTORY — PX: IR FLUORO GUIDE CV LINE RIGHT: IMG2283

## 2019-09-14 LAB — RENAL FUNCTION PANEL
Albumin: 3 g/dL — ABNORMAL LOW (ref 3.5–5.0)
Anion gap: 23 — ABNORMAL HIGH (ref 5–15)
BUN: 197 mg/dL — ABNORMAL HIGH (ref 6–20)
CO2: 13 mmol/L — ABNORMAL LOW (ref 22–32)
Calcium: 6.3 mg/dL — CL (ref 8.9–10.3)
Chloride: 104 mmol/L (ref 98–111)
Creatinine, Ser: 23.15 mg/dL — ABNORMAL HIGH (ref 0.61–1.24)
GFR calc Af Amer: 2 mL/min — ABNORMAL LOW (ref >60)
GFR calc non Af Amer: 2 mL/min — ABNORMAL LOW (ref >60)
Glucose, Bld: 110 mg/dL — ABNORMAL HIGH (ref 70–99)
Phosphorus: 9.3 mg/dL — ABNORMAL HIGH (ref 2.5–4.6)
Potassium: 4.8 mmol/L (ref 3.5–5.1)
Sodium: 140 mmol/L (ref 135–145)

## 2019-09-14 LAB — ECHOCARDIOGRAM COMPLETE
Height: 66.75 in
Weight: 3040 oz

## 2019-09-14 LAB — CBC
HCT: 32.4 % — ABNORMAL LOW (ref 39.0–52.0)
Hemoglobin: 10.5 g/dL — ABNORMAL LOW (ref 13.0–17.0)
MCH: 24.5 pg — ABNORMAL LOW (ref 26.0–34.0)
MCHC: 32.4 g/dL (ref 30.0–36.0)
MCV: 75.5 fL — ABNORMAL LOW (ref 80.0–100.0)
Platelets: 292 10*3/uL (ref 150–400)
RBC: 4.29 MIL/uL (ref 4.22–5.81)
RDW: 15.1 % (ref 11.5–15.5)
WBC: 8.1 10*3/uL (ref 4.0–10.5)
nRBC: 0 % (ref 0.0–0.2)

## 2019-09-14 LAB — FERRITIN: Ferritin: 498 ng/mL — ABNORMAL HIGH (ref 24–336)

## 2019-09-14 LAB — HEPATITIS B CORE ANTIBODY, TOTAL: Hep B Core Total Ab: NONREACTIVE

## 2019-09-14 LAB — IRON AND TIBC
Iron: 108 ug/dL (ref 45–182)
Saturation Ratios: 51 % — ABNORMAL HIGH (ref 17.9–39.5)
TIBC: 210 ug/dL — ABNORMAL LOW (ref 250–450)
UIBC: 102 ug/dL

## 2019-09-14 LAB — HEPATITIS B SURFACE ANTIBODY,QUALITATIVE: Hep B S Ab: NONREACTIVE

## 2019-09-14 LAB — PROTIME-INR
INR: 1.1 (ref 0.8–1.2)
Prothrombin Time: 14.4 seconds (ref 11.4–15.2)

## 2019-09-14 LAB — HEPATITIS B SURFACE ANTIGEN: Hepatitis B Surface Ag: NONREACTIVE

## 2019-09-14 MED ORDER — LIDOCAINE HCL (PF) 1 % IJ SOLN
INTRAMUSCULAR | Status: AC | PRN
  Administered 2019-09-14: 10 mL

## 2019-09-14 MED ORDER — HEPARIN SODIUM (PORCINE) 1000 UNIT/ML IJ SOLN
INTRAMUSCULAR | Status: AC
  Administered 2019-09-14: 1000 [IU]
  Filled 2019-09-14: qty 1

## 2019-09-14 MED ORDER — CEFAZOLIN SODIUM-DEXTROSE 2-4 GM/100ML-% IV SOLN
2.0000 g | Freq: Once | INTRAVENOUS | Status: AC
  Administered 2019-09-14: 2 g via INTRAVENOUS
  Filled 2019-09-14 (×2): qty 100

## 2019-09-14 MED ORDER — LIDOCAINE HCL 1 % IJ SOLN
INTRAMUSCULAR | Status: AC
  Filled 2019-09-14: qty 20

## 2019-09-14 MED ORDER — CALCIUM CARBONATE ANTACID 500 MG PO CHEW: 400.0000 mg | CHEWABLE_TABLET | Freq: Three times a day (TID) | ORAL | Status: DC

## 2019-09-14 MED ORDER — CEFAZOLIN SODIUM-DEXTROSE 1-4 GM/50ML-% IV SOLN
INTRAVENOUS | Status: AC | PRN
  Administered 2019-09-14: 2 g via INTRAVENOUS

## 2019-09-14 MED ORDER — SODIUM CHLORIDE 0.9 % IV SOLN
INTRAVENOUS | Status: AC | PRN
  Administered 2019-09-14: 10 mL/h via INTRAVENOUS

## 2019-09-14 MED ORDER — CHLORHEXIDINE GLUCONATE CLOTH 2 % EX PADS
6.0000 | MEDICATED_PAD | Freq: Every day | CUTANEOUS | Status: DC
  Administered 2019-09-15 – 2019-09-17 (×3): 6 via TOPICAL

## 2019-09-14 MED ORDER — FENTANYL CITRATE (PF) 100 MCG/2ML IJ SOLN
INTRAMUSCULAR | Status: AC
  Filled 2019-09-14: qty 2

## 2019-09-14 MED ORDER — SODIUM CHLORIDE 0.9 % IV SOLN
INTRAVENOUS | Status: DC | PRN
  Administered 2019-09-14 – 2019-09-15 (×2): 250 mL via INTRAVENOUS

## 2019-09-14 MED ORDER — HYDRALAZINE HCL 50 MG PO TABS
75.0000 mg | ORAL_TABLET | Freq: Three times a day (TID) | ORAL | Status: DC
  Administered 2019-09-14 – 2019-09-16 (×6): 75 mg via ORAL
  Filled 2019-09-14 (×6): qty 1

## 2019-09-14 MED ORDER — AMLODIPINE BESYLATE 10 MG PO TABS
10.0000 mg | ORAL_TABLET | Freq: Every day | ORAL | Status: DC
  Administered 2019-09-14 – 2019-09-17 (×4): 10 mg via ORAL
  Filled 2019-09-14 (×4): qty 1

## 2019-09-14 MED ORDER — ATORVASTATIN CALCIUM 10 MG PO TABS
20.0000 mg | ORAL_TABLET | Freq: Every day | ORAL | Status: DC
  Administered 2019-09-14 – 2019-09-17 (×4): 20 mg via ORAL
  Filled 2019-09-14 (×4): qty 2

## 2019-09-14 MED ORDER — LABETALOL HCL 100 MG PO TABS
100.0000 mg | ORAL_TABLET | Freq: Two times a day (BID) | ORAL | Status: DC
  Administered 2019-09-14 – 2019-09-16 (×5): 100 mg via ORAL
  Filled 2019-09-14 (×5): qty 1

## 2019-09-14 MED ORDER — CALCIUM GLUCONATE-NACL 2-0.675 GM/100ML-% IV SOLN
2.0000 g | Freq: Once | INTRAVENOUS | Status: AC
  Administered 2019-09-14: 10:00:00 2000 mg via INTRAVENOUS
  Filled 2019-09-14: qty 100

## 2019-09-14 MED ORDER — MIDAZOLAM HCL 2 MG/2ML IJ SOLN
INTRAMUSCULAR | Status: AC | PRN
  Administered 2019-09-14 (×2): 1 mg via INTRAVENOUS

## 2019-09-14 MED ORDER — LABETALOL HCL 200 MG PO TABS
200.0000 mg | ORAL_TABLET | Freq: Once | ORAL | Status: AC
  Administered 2019-09-14: 200 mg via ORAL
  Filled 2019-09-14: qty 1

## 2019-09-14 MED ORDER — MIDAZOLAM HCL 2 MG/2ML IJ SOLN
INTRAMUSCULAR | Status: AC
  Filled 2019-09-14: qty 2

## 2019-09-14 MED ORDER — ASPIRIN 81 MG PO CHEW
81.0000 mg | CHEWABLE_TABLET | Freq: Every day | ORAL | Status: DC
  Administered 2019-09-14 – 2019-09-17 (×4): 81 mg via ORAL
  Filled 2019-09-14 (×4): qty 1

## 2019-09-14 MED ORDER — CEFAZOLIN SODIUM-DEXTROSE 2-4 GM/100ML-% IV SOLN
INTRAVENOUS | Status: AC
  Filled 2019-09-14: qty 100

## 2019-09-14 NOTE — Consult Note (Addendum)
Hospital Consult    Reason for Consult:  Permanent dialysis access Requesting Physician:  Moshe Cipro MRN #:  [id number]  History of Present Illness: This is a 53 y.o. male who was admitted yesterday with dizziness and a pre-syncopal episode after taking his meds at once after missing them the past week.  His BUN/creatinine in the ER was 22/180 and is likely ESRD.  He does have some facial twitching and it is felt this is due to hypocalcemia.   He is getting a TDC by IR today and vascular surgery is asked to evaluate pt for permanent dialysis access.   The pt is on a statin for cholesterol management.  The pt is on a daily aspirin.   Other AC:  none The pt is on CCB, BB for hypertension.   The pt is not diabetic.   Tobacco hx:  remote  Past Medical History:  Diagnosis Date  . Hypertension   . TIA (transient ischemic attack)     History reviewed. No pertinent surgical history.  No Known Allergies  Prior to Admission medications   Medication Sig Start Date End Date Taking? Authorizing Provider  amLODipine (NORVASC) 10 MG tablet Take 1 tablet (10 mg total) by mouth daily. 05/20/19  Yes Ladell Pier, MD  aspirin 81 MG tablet Take 1 tablet (81 mg total) by mouth daily. 03/17/17  Yes [name], MD  atorvastatin (LIPITOR) 20 MG tablet Take 1 tablet (20 mg total) by mouth daily. 05/18/19  Yes Ladell Pier, MD  hydrALAZINE (APRESOLINE) 50 MG tablet Take 1.5 tablets (75 mg total) by mouth 3 (three) times daily. 05/18/19  Yes Ladell Pier, MD  isosorbide mononitrate (IMDUR) 30 MG 24 hr tablet Take 1 tablet (30 mg total) by mouth daily. 05/20/19  Yes Ladell Pier, MD  labetalol (NORMODYNE) 200 MG tablet Take 1 tablet (200 mg total) by mouth 2 (two) times daily. 05/20/19  Yes Ladell Pier, MD    Social History   Socioeconomic History  . Marital status: Divorced    Spouse name: Not on file  . Number of children: Not on file  . Years of education: Not on file   . Highest education level: Not on file  Occupational History  . Not on file  Social Needs  . Financial resource strain: Not on file  . Food insecurity    Worry: Not on file    Inability: Not on file  . Transportation needs    Medical: Not on file    Non-medical: Not on file  Tobacco Use  . Smoking status: Former Smoker    Types: Cigarettes    Quit date: 07/02/2016    Years since quitting: 3.2  . Smokeless tobacco: Never Used  Substance and Sexual Activity  . Alcohol use: No  . Drug use: No  . Sexual activity: Not on file  Lifestyle  . Physical activity    Days per week: Not on file    Minutes per session: Not on file  . Stress: Not on file  Relationships  . Social Herbalist on phone: Not on file    Gets together: Not on file    Attends religious service: Not on file    Active member of club or organization: Not on file    Attends meetings of clubs or organizations: Not on file    Relationship status: Not on file  . Intimate partner violence    Fear of current or ex partner:  Not on file    Emotionally abused: Not on file    Physically abused: Not on file    Forced sexual activity: Not on file  Other Topics Concern  . Not on file  Social History Narrative  . Not on file     Family History  Problem Relation Age of Onset  . Stroke Mother   . Hypertension Mother   . Heart attack Father   . Cancer Sister     ROS: [x]  Positive   [ ]  Negative   [ ]  All sytems reviewed and are negative  Cardiac: [x]  high blood pressure  Vascular: []  swelling in legs  Pulmonary: []  productive cough []  asthma/wheezing []  home O2  Neurologic: []  hx of CVA [x]  mini stroke  Hematologic: []  hx of cancer  Endocrine:   []  diabetes  GI []  vomiting blood []  blood in stool  GU: [x]  CKD/renal failure []  HD--[]  M/W/F or []  T/T/S  Psychiatric: []  anxiety []  depression  Musculoskeletal: []  arthritis []  joint pain  Integumentary: []  rashes []  ulcers   Constitutional: []  fever []  chills [x]  N/V [x]  weight loss   Physical Examination  Vitals:   09/14/19 0833 09/14/19 0834  BP: (!) 146/93   Pulse:  99  Resp:    Temp:    SpO2:     Body mass index is 29.98 kg/m.  General:  WDWN in NAD Gait: Not observed HENT: WNL, normocephalic Pulmonary: normal non-labored breathing, without Rales, rhonchi,  wheezing Cardiac: regular Skin: without rashes Vascular Exam/Pulses:  Right Left  Radial 2+ (normal) 2+ (normal)  DP 2+ (normal) 2+ (normal)   Extremities: without ischemic changes, without Gangrene , without cellulitis; without open wounds; IV present in left hand.  Surface veins on exam are adequate.  Musculoskeletal: no muscle wasting or atrophy  Neurologic: A&O X 3;  No focal weakness or paresthesias are detected; speech is fluent/normal Psychiatric:  The pt has Normal affect.   CBC    Component Value Date/Time   WBC 8.1 09/14/2019 0450   RBC 4.29 09/14/2019 0450   HGB 10.5 (L) 09/14/2019 0450   HGB 17.5 09/06/2018 1213   HCT 32.4 (L) 09/14/2019 0450   HCT 53.4 (H) 09/06/2018 1213   PLT 292 09/14/2019 0450   PLT 411 09/06/2018 1213   MCV 75.5 (L) 09/14/2019 0450   MCV 75 (L) 09/06/2018 1213   MCH 24.5 (L) 09/14/2019 0450   MCHC 32.4 09/14/2019 0450   RDW 15.1 09/14/2019 0450   RDW 17.0 (H) 09/06/2018 1213   LYMPHSABS 0.4 (L) 09/13/2019 0738   MONOABS 0.4 09/13/2019 0738   EOSABS 0.2 09/13/2019 0738   BASOSABS 0.1 09/13/2019 0738    BMET    Component Value Date/Time   NA 140 09/14/2019 0451   NA 140 09/06/2018 1213   K 4.8 09/14/2019 0451   CL 104 09/14/2019 0451   CO2 13 (L) 09/14/2019 0451   GLUCOSE 110 (H) 09/14/2019 0451   BUN 197 (H) 09/14/2019 0451   BUN 56 (H) 09/06/2018 1213   CREATININE 23.15 (H) 09/14/2019 0451   CREATININE 2.92 (H) 08/13/2016 1459   CALCIUM 6.3 (LL) 09/14/2019 0451   GFRNONAA 2 (L) 09/14/2019 0451   GFRAA 2 (L) 09/14/2019 0451    COAGS: Lab Results  Component Value Date    INR 1.1 09/14/2019   INR 1.00 08/03/2016     Non-Invasive Vascular Imaging:   BUE vein mapping 09/14/2019: +-----------------+-------------+----------+--------+ Right Cephalic   Diameter (cm)Depth (cm)Findings +-----------------+-------------+----------+--------+ Shoulder  0.47        0.63            +-----------------+-------------+----------+--------+ Prox upper arm       0.55        0.44            +-----------------+-------------+----------+--------+ Mid upper arm        0.69        0.32            +-----------------+-------------+----------+--------+ Dist upper arm       0.52        0.25            +-----------------+-------------+----------+--------+ Antecubital fossa    0.57        0.48            +-----------------+-------------+----------+--------+ Prox forearm         0.42        0.54            +-----------------+-------------+----------+--------+ Mid forearm          0.40        0.40            +-----------------+-------------+----------+--------+ Dist forearm         0.41        0.33            +-----------------+-------------+----------+--------+  +-----------------+-------------+----------+--------+ Right Basilic    Diameter (cm)Depth (cm)Findings +-----------------+-------------+----------+--------+ Mid upper arm        0.58        1.04            +-----------------+-------------+----------+--------+ Dist upper arm       0.53        0.80            +-----------------+-------------+----------+--------+ Antecubital fossa    0.44        0.32            +-----------------+-------------+----------+--------+ Prox forearm         0.39        0.21            +-----------------+-------------+----------+--------+ Mid forearm          0.36        0.22            +-----------------+-------------+----------+--------+ Distal forearm       0.26        0.30             +-----------------+-------------+----------+--------+  +-----------------+-------------+----------+---------+ Left Cephalic    Diameter (cm)Depth (cm)Findings  +-----------------+-------------+----------+---------+ Shoulder             0.69        0.59             +-----------------+-------------+----------+---------+ Prox upper arm       0.61        0.42             +-----------------+-------------+----------+---------+ Mid upper arm        0.74        0.36             +-----------------+-------------+----------+---------+ Dist upper arm       0.69        0.28             +-----------------+-------------+----------+---------+ Antecubital fossa    0.72        0.25   branching +-----------------+-------------+----------+---------+ Prox forearm         0.26  0.49             +-----------------+-------------+----------+---------+ Mid forearm          0.27        0.50             +-----------------+-------------+----------+---------+ Dist forearm         0.34        0.38             +-----------------+-------------+----------+---------+  +-----------------+-------------+----------+--------+ Left Basilic     Diameter (cm)Depth (cm)Findings +-----------------+-------------+----------+--------+ Prox upper arm       0.64        1.34            +-----------------+-------------+----------+--------+ Mid upper arm        0.59        1.15            +-----------------+-------------+----------+--------+ Dist upper arm       0.49        0.33            +-----------------+-------------+----------+--------+ Antecubital fossa    0.52        0.25            +-----------------+-------------+----------+--------+ Prox forearm         0.44        0.29            +-----------------+-------------+----------+--------+ Mid forearm          0.27        0.30            +-----------------+-------------+----------+--------+  Distal forearm       0.31        0.23            +-----------------+-------------+----------+--------+    ASSESSMENT/PLAN: This is a 53 y.o. male now with ESRD in need of permanent dialysis access  -pt is right hand dominant.  He has adequate vein on vein mapping and physical exam.  Will restrict left arm and have them move IV to right arm. -will plan for left radial cephalic AV fistula while in the hospital.  Plan is for HD today and tomorrow. -Dr. Donnetta Hutching discussed dialysis access surgery with pt and his daughter who is present in the room   Leontine Locket, PA-C [facility] [phone]   I have examined the patient, reviewed and agree with above.  The patient has a very nice cephalic vein both by duplex and by physical exam.  Will be a great candidate for radiocephalic fistula.  Will restrict left arm.  Discussed with the patient and his daughter present.  Is plan for hemodialysis today and tomorrow.  We will tentatively plan for fistula on Friday, 09/16/2019  Curt Jews, MD 09/14/2019 4:19 PM

## 2019-09-14 NOTE — Progress Notes (Signed)
Renal Navigator notified by Nephrologist/Dr. Moshe Cipro that patient needs referral for OP HD treatment. Renal Navigator attempted to meet with patient x2 today, but he was not in his room either time. Renal Navigator will attempt again to complete referral.  Alphonzo Cruise, [place] Renal Navigator [phone]

## 2019-09-14 NOTE — Progress Notes (Signed)
Upper extremity vein mapping has been completed.   Preliminary results in CV Proc.   Abram Sander 09/14/2019 2:49 PM

## 2019-09-14 NOTE — Progress Notes (Signed)
MD on call returned text page and acknowledged Calcium level

## 2019-09-14 NOTE — Progress Notes (Signed)
Text page to Dr Marva Panda on call for IMTS due to call from lab of Calcium 6.3.

## 2019-09-14 NOTE — Progress Notes (Addendum)
Subjective:  Patient is a 53 yo. M with history of poorly controlled HTN, CKD IV admitted to the IM service yesterday following episode of presyncope/hypotension 70/50 when he took all his BP meds at once. He was found to have significantly elevated BUN > 180 and Cr of 22 (baseline 6 one year ago). He had complaints of facial twitching on arrival and received 1 g of IV calcium. This morning, he continues to have complaints of facial twitching but otherwise feels the same as yesterday. Overnight events significant for elevations of BP to 178/119. He was given dose of labetalol 200 mg last night but BP remains 146/93. He has no new complaints of dizziness, n/v, headache, chest pain, dyspnea. He is scheduled to receive HD today following IR placement of TDC.   Objective:  Vital signs in last 24 hours: Vitals:   09/13/19 2345 09/14/19 0455 09/14/19 0833 09/14/19 0834  BP: (!) 178/119 (!) 158/106 (!) 146/93   Pulse: (!) 109 99  99  Resp: 18 18    Temp: 98.5 F (36.9 C) 99 F (37.2 C)    TempSrc: Oral Oral    SpO2: 95% 97%    Weight:      Height:       Results for orders placed or performed during the hospital encounter of 09/13/19 (from the past 24 hour(s))  HIV Antibody (routine testing w rflx)     Status: None   Collection Time: 09/13/19  2:30 PM  Result Value Ref Range   HIV Screen 4th Generation wRfx NON REACTIVE NON REACTIVE  Renal function panel     Status: Abnormal   Collection Time: 09/13/19  2:30 PM  Result Value Ref Range   Sodium 138 135 - 145 mmol/L   Potassium 4.8 3.5 - 5.1 mmol/L   Chloride 101 98 - 111 mmol/L   CO2 15 (L) 22 - 32 mmol/L   Glucose, Bld 154 (H) 70 - 99 mg/dL   BUN 191 (H) 6 - 20 mg/dL   Creatinine, Ser 22.67 (H) 0.61 - 1.24 mg/dL   Calcium 6.1 (LL) 8.9 - 10.3 mg/dL   Phosphorus 8.6 (H) 2.5 - 4.6 mg/dL   Albumin 3.1 (L) 3.5 - 5.0 g/dL   GFR calc non Af Amer 2 (L) >60 mL/min   GFR calc Af Amer 2 (L) >60 mL/min   Anion gap 22 (H) 5 - 15  CBC      Status: Abnormal   Collection Time: 09/14/19  4:50 AM  Result Value Ref Range   WBC 8.1 4.0 - 10.5 K/uL   RBC 4.29 4.22 - 5.81 MIL/uL   Hemoglobin 10.5 (L) 13.0 - 17.0 g/dL   HCT 32.4 (L) 39.0 - 52.0 %   MCV 75.5 (L) 80.0 - 100.0 fL   MCH 24.5 (L) 26.0 - 34.0 pg   MCHC 32.4 30.0 - 36.0 g/dL   RDW 15.1 11.5 - 15.5 %   Platelets 292 150 - 400 K/uL   nRBC 0.0 0.0 - 0.2 %  Iron and TIBC     Status: Abnormal   Collection Time: 09/14/19  4:50 AM  Result Value Ref Range   Iron 108 45 - 182 ug/dL   TIBC 210 (L) 250 - 450 ug/dL   Saturation Ratios 51 (H) 17.9 - 39.5 %   UIBC 102 ug/dL  Ferritin     Status: Abnormal   Collection Time: 09/14/19  4:50 AM  Result Value Ref Range   Ferritin 498 (H) 24 -  336 ng/mL  Protime-INR     Status: None   Collection Time: 09/14/19  4:50 AM  Result Value Ref Range   Prothrombin Time 14.4 11.4 - 15.2 seconds   INR 1.1 0.8 - 1.2  Renal function panel     Status: Abnormal   Collection Time: 09/14/19  4:51 AM  Result Value Ref Range   Sodium 140 135 - 145 mmol/L   Potassium 4.8 3.5 - 5.1 mmol/L   Chloride 104 98 - 111 mmol/L   CO2 13 (L) 22 - 32 mmol/L   Glucose, Bld 110 (H) 70 - 99 mg/dL   BUN 197 (H) 6 - 20 mg/dL   Creatinine, Ser 23.15 (H) 0.61 - 1.24 mg/dL   Calcium 6.3 (LL) 8.9 - 10.3 mg/dL   Phosphorus 9.3 (H) 2.5 - 4.6 mg/dL   Albumin 3.0 (L) 3.5 - 5.0 g/dL   GFR calc non Af Amer 2 (L) >60 mL/min   GFR calc Af Amer 2 (L) >60 mL/min   Anion gap 23 (H) 5 - 15    Intake/Output Summary (Last 24 hours) at 09/14/2019 1047 Last data filed at 09/14/2019 0600 Gross per 24 hour  Intake 160 ml  Output -  Net 160 ml   Physical Exam:  General: NAD, A&Ox3, facial twitching noticed throughout encounter, +chvostek HEENT: Firm 4 cm nodule on head,  Cardio: RRR, tachycardia present, no m/r/g  Pulm: CTAB, no w/r/r, breathing effort normal  Abdominal: active bowel sounds, soft abdomen, no distension, no abdominal tenderness. Skin: Warm and dry   Extremities: 1+ LE edema bilaterally  Neuro: No focal neuro defecits, A&Ox3,   Assessment/Plan:  Patient is a 53 yo. M with h/o poorly controlled HTN, CKD IV here for management of uremia and ESRD. His BUN has risen this AM to 197 and Cr is 23 (up from baseline of 6). Renal US showed no evidence of hydro and eGFR is 2. Nephrology consult believes he has progressed to ESRD from long standing HTN needing urgent dialysis. He has been scheduled for [facility] placement today with IR and will receive HD after. In the interim, he is being managed for increasing BP and sx of hypocalcemia with +chvostek.      Principal Problem:  1. Acute on chronic renal failure   Currently managed by nephrology  IR will take him for [facility] placement and initiate HD after   Consult vasc surg for AVF/AVG placement   2. HTN  BP rose to 178/119 last night and remains elevated after dose of labetalol 263m   Restarted on home BP meds: labetalol 100 mg BID, amlodipine 10 mg, hydralazine 75 mg TID this AM per nephrology.  BP should improve following HD   3. Hypocalciemia   Corrected calcium this AM of 7.1 with persistent sx of facial twitching following 1g calcium IV.   Nephrology ordered addition 2g calcium IV and TUMS   Will continue to monitor Ca levels in repeat labs and for improvement in sx.   4. Metabolic acidosis  AG of 24 likely from uremia (BUN 197)   No intervention needed at this time, acidosis should correct with HD.    LOS: 1 day   YMelina Modena Medical Student 09/14/2019, 10:47 AM

## 2019-09-14 NOTE — Progress Notes (Signed)
  Echocardiogram 2D Echocardiogram has been performed.  Jessa Stinson L Androw 09/14/2019, 2:58 PM

## 2019-09-14 NOTE — Progress Notes (Signed)
  Date: 09/14/2019  Patient name: Alfonza Briese  Medical record number: [id number]  Date of birth: August 24, 1966   I have seen and evaluated [name] and discussed their care with the Residency Team.  In brief, patient is a 53 year old male with past medical history of poorly controlled hypertension and CKD stage IV who presented to the ED with lightheadedness after taking all his antihypertensive medications at home.  Patient has only been intermittently compliant with his antihypertensive medications and states that on the day of his admission he decided to take all his antihypertensive medications at once.  After taking these medications patient felt lightheaded like he was going to pass out and came to the ED for further evaluation.  Of note, patient also had an approximately 30 pound weight loss over the last couple of months as well as anorexia and food aversions.  He also complains of decreased urination and excessive thirst.  No fevers or chills, no chest pain, no shortness of breath, no palpitations, no diaphoresis, no headache, no blurry vision, no abdominal pain, nausea or vomiting, no diarrhea.  Patient does complain of generalized weakness.  Today patient states that he feels "weird" and does complain of twitching of the muscles of his face.  PMHx, Fam Hx, and/or Soc Hx : As per resident admit note  Vitals:   09/14/19 1315 09/14/19 1339  BP: 124/86 124/82  Pulse: 100 95  Resp: 16 18  Temp:  98.2 F (36.8 C)  SpO2: 100% 97%   General: Awake, leucovorin x3, NAD CVS: Regular rate and rhythm, normal heart sounds Lungs: CTA bilaterally Abdomen: Soft, nontender, nondistended, normoactive bowel sounds Extremities: No edema noted, nontender to palpation HEENT: Normocephalic, atraumatic Neuro: Oriented x3, patient with noted facial twitching and positive Chvostek sign, no tremors Psych: Normal mood and affect  Assessment and Plan: I have seen and evaluated the patient as  outlined above. I agree with the formulated Assessment and Plan as detailed in the residents' note, with the following changes:   1.  Acute on chronic renal failure progressing to ESRD: -Patient presented to ED with lightheadedness secondary to taking all his antihypertensive medications at 1 time and was found to have a serum creatinine of 22.18 up from his baseline of approximately 6 and a BUN of 187 consistent with end-stage renal disease. -I suspect that the reason for the progression of his renal failure is uncontrolled hypertension at home -Renal ultrasound with no evidence of hydronephrosis -Patient was noted to be hypocalcemic with calcium was in the sixes and also had muscle twitching and a positive Chvostek sign consistent with severe hypocalcemia.  He was given an additional 2 g of calcium today by nephrology.  We will continue to monitor his calcium levels -Patient to have tunneled HD cath placed by IR today for urgent HD -Nephrology follow-up and recommendations appreciated.  Patient received his first hemodialysis session today.  He will also receive an additional hemodialysis session tomorrow as well. -We will need to consult vascular surgery for AV fistula placement  -We will contact renal social worker to initiate CLIP process for outpatient hemodialysis -Patient is currently on amlodipine, hydralazine and labetalol for his hypertension which appears to be well controlled currently.  I suspect that he will not need as much antihypertensives after initiation of his hemodialysis.  We will continue to monitor blood pressure closely -No further work-up at this time.  Aldine Contes, MD 10/21/20201:53 PM

## 2019-09-14 NOTE — Procedures (Signed)
Interventional Radiology Procedure Note  Procedure: Placement of a 23 cm Palindrome tunneled HD catheter via right IJ.  Tips in the upper RA and ready for use.   Complications: None  Estimated Blood Loss: None  Recommendations: - May use line now - Routine line care  Signed,  Criselda Peaches, MD

## 2019-09-14 NOTE — Progress Notes (Addendum)
BP 178/119 (136). Asymptomatic at this time. Paged IM about adding home BP meds. Will continue to monitor while awaiting orders.   Jackalyn Lombard   MD added 200 mg labetalol PO. Given to patient, will continue to monitor pressure.

## 2019-09-14 NOTE — Plan of Care (Signed)
°  Problem: Education: °Goal: Knowledge of General Education information will improve °Description: Including pain rating scale, medication(s)/side effects and non-pharmacologic comfort measures °Outcome: Progressing °  °Problem: Clinical Measurements: °Goal: Ability to maintain clinical measurements within normal limits will improve °Outcome: Progressing °  °Problem: Clinical Measurements: °Goal: Cardiovascular complication will be avoided °Outcome: Progressing °  °

## 2019-09-14 NOTE — Progress Notes (Signed)
Subjective:  Feels about the same-  Still with facial twitching- calcium still low.   Plan is for [facility] today per IR followed by his first HD  Objective Vital signs in last 24 hours: Vitals:   09/13/19 1527 09/13/19 2345 09/14/19 0455 09/14/19 0833  BP: (!) 151/104 (!) 178/119 (!) 158/106 (!) 146/93  Pulse: (!) 108 (!) 109 99   Resp: 18 18 18    Temp: 98.2 F (36.8 C) 98.5 F (36.9 C) 99 F (37.2 C)   TempSrc: Oral Oral Oral   SpO2:  95% 97%   Weight:      Height:       Weight change:   Intake/Output Summary (Last 24 hours) at 09/14/2019 0834 Last data filed at 09/14/2019 0600 Gross per 24 hour  Intake 160 ml  Output -  Net 160 ml    Assessment/Plan: 53 year old Hispanic male with poorly controlled hypertension and advanced CKD noted in the past.  He now is at ESRD with creatinine of 22 1.Renal-creatinine of six 1 year ago.  Now creatinine of 22 with BUN of 180 and uremic symptoms.  There is no obstruction on his ultrasound.  This will not be reversible, he is now ESRD.  I explained to him what this means.  He seems to have some idea.  He is to get a tunneled dialysis catheter today followed by his first  hemodialysis.  We will plan for second treatment tomorrow.  Will vein map, consult vascular for AV fistula and initiate CLIP for outpatient dialysis.  He lives in [place] 2. Hypertension/volume  - blood pressure elevated.  He is a little bit volume overloaded.  Has been ordered norvasc, hydralazine and labetalol.  Will follow, he may not need this much medicine after HD is initiated. 3.  Metabolic acidosis-should correct with dialysis 4. Anemia  -interesting that this is not more advanced.   iron stores OK, will initiate ESA when needed 5.  Bones-I think his facial twitching is from hypocalcemia, will give him some IV repletion.  Will repeat today and add tums with meals for phosphate binding.  Will check PTH and act as needed    [name]: Basic  Metabolic Panel: Recent Labs  Lab 09/13/19 0738 09/13/19 1430 09/14/19 0451  NA 140 138 140  K 4.6 4.8 4.8  CL 103 101 104  CO2 13* 15* 13*  GLUCOSE 127* 154* 110*  BUN 187* 191* 197*  CREATININE 22.18* 22.67* 23.15*  CALCIUM 6.3* 6.1* 6.3*  PHOS  --  8.6* 9.3*   Liver Function Tests: Recent Labs  Lab 09/13/19 0738 09/13/19 1430 09/14/19 0451  AST 11*  --   --   ALT 10  --   --   ALKPHOS 54  --   --   BILITOT 0.8  --   --   PROT 6.8  --   --   ALBUMIN 3.2* 3.1* 3.0*   No results for input(s): LIPASE, AMYLASE in the last 168 hours. No results for input(s): AMMONIA in the last 168 hours. CBC: Recent Labs  Lab 09/13/19 0738 09/14/19 0450  WBC 8.7 8.1  NEUTROABS 7.5  --   HGB 11.4* 10.5*  HCT 35.5* 32.4*  MCV 76.2* 75.5*  PLT 332 292   Cardiac Enzymes: No results for input(s): CKTOTAL, CKMB, CKMBINDEX, TROPONINI in the last 168 hours. CBG: No results for input(s): GLUCAP in the last 168 hours.  Iron Studies:  Recent Labs  09/14/19 0450  IRON 108  TIBC 210*  FERRITIN 498*   Studies/Results: US Renal  Result Date: 09/13/2019 CLINICAL DATA:  54 year old male with acute chronic renal failure. EXAM: RENAL / URINARY TRACT ULTRASOUND COMPLETE COMPARISON:  Renal ultrasound dated 01/07/2018 FINDINGS: Right Kidney: Renal measurements: 9.8 x 4.0 x 5.5 cm = volume: 172 mL. There is diffuse increased renal echogenicity. No hydronephrosis or shadowing stone. There is a 1.2 x 1.3 x 1.4 cm interpolar cyst. Left Kidney: Renal measurements: 9.8 x 6.1 x 4.9 cm = volume: 152 mL. Diffuse echogenic kidney. There is a 2.8 x 1.8 x 2.2 cm upper pole cyst and a 1.3 x 1.3 x 1.1 cm lower pole cyst. No hydronephrosis or shadowing stone. Bladder: Appears normal for degree of bladder distention. Other: Prostate gland measures 4.3 x 3.5 x 4.1 cm. IMPRESSION: 1. Echogenic kidneys, likely related to underlying medical renal disease. No hydronephrosis or shadowing stone. 2. Bilateral renal  cysts. Electronically Signed   By: Anner Crete M.D.   On: 09/13/2019 11:20   Dg Chest Port 1 View  Result Date: 09/13/2019 CLINICAL DATA:  Syncope EXAM: PORTABLE CHEST 1 VIEW COMPARISON:  08/03/2016 FINDINGS: Mild cardiomegaly and vascular congestion. No confluent opacities or effusions. No acute bony abnormality. IMPRESSION: Cardiomegaly, vascular congestion. Electronically Signed   By: Rolm Baptise M.D.   On: 09/13/2019 08:03   Medications: Infusions: . sodium chloride 250 mL (09/14/19 0516)  . calcium gluconate      Scheduled Medications: . amLODipine  10 mg Oral Daily  . aspirin  81 mg Oral Daily  . atorvastatin  20 mg Oral Daily  . calcium carbonate  400 mg of elemental calcium Oral TID WC  . Chlorhexidine Gluconate Cloth  6 each Topical Q0600  . heparin  5,000 Units Subcutaneous Q8H  . hydrALAZINE  75 mg Oral TID  . labetalol  100 mg Oral BID    have reviewed scheduled and prn medications.  Physical Exam: General: facial twitching- otherwise NAD Heart: RRR Lungs: mostly clear  Abdomen: soft, non tender  Extremities: min edema  Dialysis Access: none yet     09/14/2019,8:34 AM  LOS: 1 day

## 2019-09-14 NOTE — H&P (View-Only) (Signed)
Hospital Consult    Reason for Consult:  Permanent dialysis access Requesting Physician:  Moshe Cipro MRN #:  [id number]  History of Present Illness: This is a 53 y.o. male who was admitted yesterday with dizziness and a pre-syncopal episode after taking his meds at once after missing them the past week.  His BUN/creatinine in the ER was 22/180 and is likely ESRD.  He does have some facial twitching and it is felt this is due to hypocalcemia.   He is getting a TDC by IR today and vascular surgery is asked to evaluate pt for permanent dialysis access.   The pt is on a statin for cholesterol management.  The pt is on a daily aspirin.   Other AC:  none The pt is on CCB, BB for hypertension.   The pt is not diabetic.   Tobacco hx:  remote  Past Medical History:  Diagnosis Date  . Hypertension   . TIA (transient ischemic attack)     History reviewed. No pertinent surgical history.  No Known Allergies  Prior to Admission medications   Medication Sig Start Date End Date Taking? Authorizing Provider  amLODipine (NORVASC) 10 MG tablet Take 1 tablet (10 mg total) by mouth daily. 05/20/19  Yes Ladell Pier, MD  aspirin 81 MG tablet Take 1 tablet (81 mg total) by mouth daily. 03/17/17  Yes [name], MD  atorvastatin (LIPITOR) 20 MG tablet Take 1 tablet (20 mg total) by mouth daily. 05/18/19  Yes Ladell Pier, MD  hydrALAZINE (APRESOLINE) 50 MG tablet Take 1.5 tablets (75 mg total) by mouth 3 (three) times daily. 05/18/19  Yes Ladell Pier, MD  isosorbide mononitrate (IMDUR) 30 MG 24 hr tablet Take 1 tablet (30 mg total) by mouth daily. 05/20/19  Yes Ladell Pier, MD  labetalol (NORMODYNE) 200 MG tablet Take 1 tablet (200 mg total) by mouth 2 (two) times daily. 05/20/19  Yes Ladell Pier, MD    Social History   Socioeconomic History  . Marital status: Divorced    Spouse name: Not on file  . Number of children: Not on file  . Years of education: Not on file   . Highest education level: Not on file  Occupational History  . Not on file  Social Needs  . Financial resource strain: Not on file  . Food insecurity    Worry: Not on file    Inability: Not on file  . Transportation needs    Medical: Not on file    Non-medical: Not on file  Tobacco Use  . Smoking status: Former Smoker    Types: Cigarettes    Quit date: 07/02/2016    Years since quitting: 3.2  . Smokeless tobacco: Never Used  Substance and Sexual Activity  . Alcohol use: No  . Drug use: No  . Sexual activity: Not on file  Lifestyle  . Physical activity    Days per week: Not on file    Minutes per session: Not on file  . Stress: Not on file  Relationships  . Social Herbalist on phone: Not on file    Gets together: Not on file    Attends religious service: Not on file    Active member of club or organization: Not on file    Attends meetings of clubs or organizations: Not on file    Relationship status: Not on file  . Intimate partner violence    Fear of current or ex partner:  Not on file    Emotionally abused: Not on file    Physically abused: Not on file    Forced sexual activity: Not on file  Other Topics Concern  . Not on file  Social History Narrative  . Not on file     Family History  Problem Relation Age of Onset  . Stroke Mother   . Hypertension Mother   . Heart attack Father   . Cancer Sister     ROS: [x]  Positive   [ ]  Negative   [ ]  All sytems reviewed and are negative  Cardiac: [x]  high blood pressure  Vascular: []  swelling in legs  Pulmonary: []  productive cough []  asthma/wheezing []  home O2  Neurologic: []  hx of CVA [x]  mini stroke  Hematologic: []  hx of cancer  Endocrine:   []  diabetes  GI []  vomiting blood []  blood in stool  GU: [x]  CKD/renal failure []  HD--[]  M/W/F or []  T/T/S  Psychiatric: []  anxiety []  depression  Musculoskeletal: []  arthritis []  joint pain  Integumentary: []  rashes []  ulcers   Constitutional: []  fever []  chills [x]  N/V [x]  weight loss   Physical Examination  Vitals:   09/14/19 0833 09/14/19 0834  BP: (!) 146/93   Pulse:  99  Resp:    Temp:    SpO2:     Body mass index is 29.98 kg/m.  General:  WDWN in NAD Gait: Not observed HENT: WNL, normocephalic Pulmonary: normal non-labored breathing, without Rales, rhonchi,  wheezing Cardiac: regular Skin: without rashes Vascular Exam/Pulses:  Right Left  Radial 2+ (normal) 2+ (normal)  DP 2+ (normal) 2+ (normal)   Extremities: without ischemic changes, without Gangrene , without cellulitis; without open wounds; IV present in left hand.  Surface veins on exam are adequate.  Musculoskeletal: no muscle wasting or atrophy  Neurologic: A&O X 3;  No focal weakness or paresthesias are detected; speech is fluent/normal Psychiatric:  The pt has Normal affect.   CBC    Component Value Date/Time   WBC 8.1 09/14/2019 0450   RBC 4.29 09/14/2019 0450   HGB 10.5 (L) 09/14/2019 0450   HGB 17.5 09/06/2018 1213   HCT 32.4 (L) 09/14/2019 0450   HCT 53.4 (H) 09/06/2018 1213   PLT 292 09/14/2019 0450   PLT 411 09/06/2018 1213   MCV 75.5 (L) 09/14/2019 0450   MCV 75 (L) 09/06/2018 1213   MCH 24.5 (L) 09/14/2019 0450   MCHC 32.4 09/14/2019 0450   RDW 15.1 09/14/2019 0450   RDW 17.0 (H) 09/06/2018 1213   LYMPHSABS 0.4 (L) 09/13/2019 0738   MONOABS 0.4 09/13/2019 0738   EOSABS 0.2 09/13/2019 0738   BASOSABS 0.1 09/13/2019 0738    BMET    Component Value Date/Time   NA 140 09/14/2019 0451   NA 140 09/06/2018 1213   K 4.8 09/14/2019 0451   CL 104 09/14/2019 0451   CO2 13 (L) 09/14/2019 0451   GLUCOSE 110 (H) 09/14/2019 0451   BUN 197 (H) 09/14/2019 0451   BUN 56 (H) 09/06/2018 1213   CREATININE 23.15 (H) 09/14/2019 0451   CREATININE 2.92 (H) 08/13/2016 1459   CALCIUM 6.3 (LL) 09/14/2019 0451   GFRNONAA 2 (L) 09/14/2019 0451   GFRAA 2 (L) 09/14/2019 0451    COAGS: Lab Results  Component Value Date    INR 1.1 09/14/2019   INR 1.00 08/03/2016     Non-Invasive Vascular Imaging:   BUE vein mapping 09/14/2019: +-----------------+-------------+----------+--------+ Right Cephalic   Diameter (cm)Depth (cm)Findings +-----------------+-------------+----------+--------+ Shoulder  0.47        0.63            +-----------------+-------------+----------+--------+ Prox upper arm       0.55        0.44            +-----------------+-------------+----------+--------+ Mid upper arm        0.69        0.32            +-----------------+-------------+----------+--------+ Dist upper arm       0.52        0.25            +-----------------+-------------+----------+--------+ Antecubital fossa    0.57        0.48            +-----------------+-------------+----------+--------+ Prox forearm         0.42        0.54            +-----------------+-------------+----------+--------+ Mid forearm          0.40        0.40            +-----------------+-------------+----------+--------+ Dist forearm         0.41        0.33            +-----------------+-------------+----------+--------+  +-----------------+-------------+----------+--------+ Right Basilic    Diameter (cm)Depth (cm)Findings +-----------------+-------------+----------+--------+ Mid upper arm        0.58        1.04            +-----------------+-------------+----------+--------+ Dist upper arm       0.53        0.80            +-----------------+-------------+----------+--------+ Antecubital fossa    0.44        0.32            +-----------------+-------------+----------+--------+ Prox forearm         0.39        0.21            +-----------------+-------------+----------+--------+ Mid forearm          0.36        0.22            +-----------------+-------------+----------+--------+ Distal forearm       0.26        0.30             +-----------------+-------------+----------+--------+  +-----------------+-------------+----------+---------+ Left Cephalic    Diameter (cm)Depth (cm)Findings  +-----------------+-------------+----------+---------+ Shoulder             0.69        0.59             +-----------------+-------------+----------+---------+ Prox upper arm       0.61        0.42             +-----------------+-------------+----------+---------+ Mid upper arm        0.74        0.36             +-----------------+-------------+----------+---------+ Dist upper arm       0.69        0.28             +-----------------+-------------+----------+---------+ Antecubital fossa    0.72        0.25   branching +-----------------+-------------+----------+---------+ Prox forearm         0.26  0.49             +-----------------+-------------+----------+---------+ Mid forearm          0.27        0.50             +-----------------+-------------+----------+---------+ Dist forearm         0.34        0.38             +-----------------+-------------+----------+---------+  +-----------------+-------------+----------+--------+ Left Basilic     Diameter (cm)Depth (cm)Findings +-----------------+-------------+----------+--------+ Prox upper arm       0.64        1.34            +-----------------+-------------+----------+--------+ Mid upper arm        0.59        1.15            +-----------------+-------------+----------+--------+ Dist upper arm       0.49        0.33            +-----------------+-------------+----------+--------+ Antecubital fossa    0.52        0.25            +-----------------+-------------+----------+--------+ Prox forearm         0.44        0.29            +-----------------+-------------+----------+--------+ Mid forearm          0.27        0.30            +-----------------+-------------+----------+--------+  Distal forearm       0.31        0.23            +-----------------+-------------+----------+--------+    ASSESSMENT/PLAN: This is a 53 y.o. male now with ESRD in need of permanent dialysis access  -pt is right hand dominant.  He has adequate vein on vein mapping and physical exam.  Will restrict left arm and have them move IV to right arm. -will plan for left radial cephalic AV fistula while in the hospital.  Plan is for HD today and tomorrow. -Dr. Donnetta Hutching discussed dialysis access surgery with pt and his daughter who is present in the room   Leontine Locket, PA-C [facility] [phone]   I have examined the patient, reviewed and agree with above.  The patient has a very nice cephalic vein both by duplex and by physical exam.  Will be a great candidate for radiocephalic fistula.  Will restrict left arm.  Discussed with the patient and his daughter present.  Is plan for hemodialysis today and tomorrow.  We will tentatively plan for fistula on Friday, 09/16/2019  Curt Jews, MD 09/14/2019 4:19 PM

## 2019-09-15 LAB — HEPATITIS B SURFACE ANTIBODY,QUALITATIVE: Hep B S Ab: NONREACTIVE

## 2019-09-15 LAB — CBC
HCT: 31.2 % — ABNORMAL LOW (ref 39.0–52.0)
Hemoglobin: 9.9 g/dL — ABNORMAL LOW (ref 13.0–17.0)
MCH: 24.1 pg — ABNORMAL LOW (ref 26.0–34.0)
MCHC: 31.7 g/dL (ref 30.0–36.0)
MCV: 75.9 fL — ABNORMAL LOW (ref 80.0–100.0)
Platelets: 283 10*3/uL (ref 150–400)
RBC: 4.11 MIL/uL — ABNORMAL LOW (ref 4.22–5.81)
RDW: 15.2 % (ref 11.5–15.5)
WBC: 6.5 10*3/uL (ref 4.0–10.5)
nRBC: 0 % (ref 0.0–0.2)

## 2019-09-15 LAB — GLUCOSE, CAPILLARY: Glucose-Capillary: 75 mg/dL (ref 70–99)

## 2019-09-15 LAB — RENAL FUNCTION PANEL
Albumin: 2.9 g/dL — ABNORMAL LOW (ref 3.5–5.0)
Anion gap: 17 — ABNORMAL HIGH (ref 5–15)
BUN: 129 mg/dL — ABNORMAL HIGH (ref 6–20)
CO2: 21 mmol/L — ABNORMAL LOW (ref 22–32)
Calcium: 6.8 mg/dL — ABNORMAL LOW (ref 8.9–10.3)
Chloride: 103 mmol/L (ref 98–111)
Creatinine, Ser: 17.61 mg/dL — ABNORMAL HIGH (ref 0.61–1.24)
GFR calc Af Amer: 3 mL/min — ABNORMAL LOW (ref >60)
GFR calc non Af Amer: 3 mL/min — ABNORMAL LOW (ref >60)
Glucose, Bld: 174 mg/dL — ABNORMAL HIGH (ref 70–99)
Phosphorus: 7.6 mg/dL — ABNORMAL HIGH (ref 2.5–4.6)
Potassium: 4 mmol/L (ref 3.5–5.1)
Sodium: 141 mmol/L (ref 135–145)

## 2019-09-15 LAB — HEPATITIS B CORE ANTIBODY, TOTAL: Hep B Core Total Ab: NONREACTIVE

## 2019-09-15 LAB — PARATHYROID HORMONE, INTACT (NO CA): PTH: 139 pg/mL — ABNORMAL HIGH (ref 15–65)

## 2019-09-15 LAB — HEPATITIS B SURFACE ANTIGEN: Hepatitis B Surface Ag: NONREACTIVE

## 2019-09-15 LAB — HEPATITIS C ANTIBODY: HCV Ab: NONREACTIVE

## 2019-09-15 MED ORDER — HEPARIN SODIUM (PORCINE) 1000 UNIT/ML DIALYSIS
20.0000 [IU]/kg | INTRAMUSCULAR | Status: DC | PRN
  Administered 2019-09-15: 07:00:00 1700 [IU] via INTRAVENOUS_CENTRAL
  Administered 2019-09-17: 13:00:00 3800 [IU] via INTRAVENOUS_CENTRAL

## 2019-09-15 MED ORDER — SODIUM CHLORIDE 0.9 % IV SOLN: 100.0000 mL | INTRAVENOUS | Status: DC | PRN

## 2019-09-15 MED ORDER — CALCIUM GLUCONATE-NACL 2-0.675 GM/100ML-% IV SOLN
2.0000 g | Freq: Once | INTRAVENOUS | Status: AC
  Administered 2019-09-15: 12:00:00 2000 mg via INTRAVENOUS
  Filled 2019-09-15: qty 100

## 2019-09-15 MED ORDER — LIDOCAINE-PRILOCAINE 2.5-2.5 % EX CREA: 1.0000 "application " | TOPICAL_CREAM | CUTANEOUS | Status: DC | PRN

## 2019-09-15 MED ORDER — ALTEPLASE 2 MG IJ SOLR: 2.0000 mg | Freq: Once | INTRAMUSCULAR | Status: DC | PRN

## 2019-09-15 MED ORDER — LIDOCAINE HCL (PF) 1 % IJ SOLN: 5.0000 mL | INTRAMUSCULAR | Status: DC | PRN

## 2019-09-15 MED ORDER — CALCIUM CARBONATE ANTACID 500 MG PO CHEW
800.0000 mg | CHEWABLE_TABLET | Freq: Three times a day (TID) | ORAL | Status: DC
  Administered 2019-09-15 – 2019-09-17 (×6): 800 mg via ORAL
  Filled 2019-09-15 (×6): qty 4

## 2019-09-15 MED ORDER — INFLUENZA VAC A&B SA ADJ QUAD 0.5 ML IM PRSY
0.5000 mL | PREFILLED_SYRINGE | INTRAMUSCULAR | Status: DC
  Filled 2019-09-15: qty 0.5

## 2019-09-15 MED ORDER — HEPARIN SODIUM (PORCINE) 1000 UNIT/ML DIALYSIS
1000.0000 [IU] | INTRAMUSCULAR | Status: DC | PRN
  Administered 2019-09-15: 3.8 [IU] via INTRAVENOUS_CENTRAL

## 2019-09-15 MED ORDER — HEPARIN SODIUM (PORCINE) 1000 UNIT/ML IJ SOLN
INTRAMUSCULAR | Status: AC
  Filled 2019-09-15: qty 4

## 2019-09-15 MED ORDER — PENTAFLUOROPROP-TETRAFLUOROETH EX AERO: 1.0000 "application " | INHALATION_SPRAY | CUTANEOUS | Status: DC | PRN

## 2019-09-15 MED ORDER — CEFAZOLIN SODIUM-DEXTROSE 2-4 GM/100ML-% IV SOLN
2.0000 g | INTRAVENOUS | Status: AC
  Administered 2019-09-16: 2 g via INTRAVENOUS
  Filled 2019-09-15: qty 100

## 2019-09-15 MED ORDER — HEPARIN SODIUM (PORCINE) 1000 UNIT/ML IJ SOLN
INTRAMUSCULAR | Status: AC
  Filled 2019-09-15: qty 2

## 2019-09-15 NOTE — Progress Notes (Signed)
Subjective:  Patient is currently being managed for acute on chronic renal failure. He was taken by IR for [facility] placement in R internal jugular yesterday without complications and received his first round of HD yesterday afternoon. He states he felt much worse after the session like he wasn't himself and his facial twitching continued following treatment session. He states he had spontaneous urine output yesterday that looked grossly normal and had a bowel movement as well. He states he felt better and was sx free when he woke up this morning, but his twitching returned soon before he started his second HD session. This morning, he continues to have facial twitching but otherwise feels the same as yesterday. He has no new complaints of dizziness, n/v, headache, chest pain, dyspnea. He has been seen by vasc surg and is scheduled for AVF placement in the L arm tomorrow.   Objective:  Vital signs in last 24 hours: Vitals:   09/15/19 0816 09/15/19 0846 09/15/19 0916 09/15/19 0946  BP: (!) 175/106 (!) 177/100 (!) 179/116 (!) 192/89  Pulse: 94 93 92 60  Resp: 12 14 18 14   Temp:      TempSrc:      SpO2:      Weight:      Height:       CBC Latest Ref Rng & Units 09/15/2019 09/14/2019 09/13/2019  WBC 4.0 - 10.5 K/uL 6.5 8.1 8.7  Hemoglobin 13.0 - 17.0 g/dL 9.9(L) 10.5(L) 11.4(L)  Hematocrit 39.0 - 52.0 % 31.2(L) 32.4(L) 35.5(L)  Platelets 150 - 400 K/uL 283 292 332   BMP Latest Ref Rng & Units 09/15/2019 09/14/2019 09/13/2019  Glucose 70 - 99 mg/dL 174(H) 110(H) 154(H)  BUN 6 - 20 mg/dL 129(H) 197(H) 191(H)  Creatinine 0.61 - 1.24 mg/dL 17.61(H) 23.15(H) 22.67(H)  BUN/Creat Ratio 9 - 20 - - -  Sodium 135 - 145 mmol/L 141 140 138  Potassium 3.5 - 5.1 mmol/L 4.0 4.8 4.8  Chloride 98 - 111 mmol/L 103 104 101  CO2 22 - 32 mmol/L 21(L) 13(L) 15(L)  Calcium 8.9 - 10.3 mg/dL 6.8(L) 6.3(LL) 6.1(LL)    Intake/Output Summary (Last 24 hours) at 09/15/2019 1023 Last data filed at 09/14/2019 1841  Gross per 24 hour  Intake 630.34 ml  Output 0 ml  Net 630.34 ml   Physical Exam:  General: NAD, A&Ox3, facial twitching noticed throughout encounter, +chvostek HEENT: Firm 4 cm nodule on head,  Cardio: RRR, tachycardia present, no m/r/g  Pulm: CTAB, no w/r/r, breathing effort normal  Abdominal: active bowel sounds, soft abdomen, no distension, no abdominal tenderness. Skin: Warm and dry  Extremities: 1+ LE edema bilaterally  Neuro: No focal neuro defecits, A&Ox3,   Assessment/Plan:  Patient is a 53 yo. M with h/o poorly controlled HTN, CKD IV here for management of uremia and acute on chronic renal failure.      Principal Problem:  1. Acute on chronic renal failure   No significant improvement in sx since first HD session yesterday. Morning labs today showed BUN 129 down from 197 yesterday and Cr 17 down from 23. He is still significantly uremic consistent with his lack of improvement. He is currently receiving his second HD and will likely need multiple rounds of dialysis to resolve uremia. He is tolerating the dialysis sessions well and is being followed by nephrology for dialysis management.  Vascular surgery consulted yesterday and performed vein mapping. They plan to create radial-cephalic AVF in the L arm tomorrow. He is scheduled for  surgery in the AM.   Continue with renal diet with fluid restriction 1200 ml and NPO at midnight for surgery.   Nephrology social work has met with him this morning regarding setting up outpatient hemodialysis.   2. HTN  BP meds (labetalol 100 mg BID, amlodipine 10 mg, hydralazine 75 mg TID) restarted yesterday but he continues to be very hypertensive, systolic~180. He may be a bit volume overloaded still but he is starting to void spontaneously. Weight not changed significantly (86.5->86.4 kg). Will continue him on current BP meds and adjust if needed following completion of HD.  3. Hypocalciemia   He has been given 3g total IV calcium since  hospitalization and started on oral calcium pills. Ca today has increased to 6.8 from 6.3 yesterday. He continues to have a +chvostek sign but it is unclear if his facial twitching at rest is caused by hypocalcemia vs uremia induced myoclonic jerk. Will continue to replenish calcium per nephrology orders and monitor for improvement following HD.   4. Metabolic acidosis  AG improved to 17 down from 23 likely from uremia (BUN 129)   No intervention needed at this time, acidosis should correct with continued HD.    LOS: 2 days   Melina Modena, Medical Student 09/15/2019, 10:23 AM

## 2019-09-15 NOTE — Anesthesia Preprocedure Evaluation (Addendum)
Anesthesia Evaluation  Patient identified by MRN, date of birth, ID band Patient awake    Reviewed: Allergy & Precautions, NPO status , Patient's Chart, lab work & pertinent test results  Airway Mallampati: II  TM Distance: >3 FB Neck ROM: Full    Dental  (+) Dental Advisory Given   Pulmonary former smoker,    breath sounds clear to auscultation       Cardiovascular hypertension, Pt. on medications  Rhythm:Regular Rate:Normal  Normal EF. Valves Okay   Neuro/Psych TIA   GI/Hepatic negative GI ROS, Neg liver ROS,   Endo/Other  negative endocrine ROS  Renal/GU Renal disease     Musculoskeletal   Abdominal   Peds  Hematology negative hematology ROS (+)   Anesthesia Other Findings   Reproductive/Obstetrics                            Lab Results  Component Value Date   WBC 6.5 09/15/2019   HGB 9.9 (L) 09/15/2019   HCT 31.2 (L) 09/15/2019   MCV 75.9 (L) 09/15/2019   PLT 283 09/15/2019   Lab Results  Component Value Date   CREATININE 17.61 (H) 09/15/2019   BUN 129 (H) 09/15/2019   NA 141 09/15/2019   K 4.0 09/15/2019   CL 103 09/15/2019   CO2 21 (L) 09/15/2019    Anesthesia Physical Anesthesia Plan  ASA: IV  Anesthesia Plan: MAC   Post-op Pain Management:    Induction:   PONV Risk Score and Plan: 1 and Propofol infusion, Ondansetron and Treatment may vary due to age or medical condition  Airway Management Planned: Natural Airway and Simple Face Mask  Additional Equipment:   Intra-op Plan:   Post-operative Plan:   Informed Consent: I have reviewed the patients History and Physical, chart, labs and discussed the procedure including the risks, benefits and alternatives for the proposed anesthesia with the patient or authorized representative who has indicated his/her understanding and acceptance.     Dental advisory given  Plan Discussed with: CRNA  Anesthesia Plan  Comments:        Anesthesia Quick Evaluation

## 2019-09-15 NOTE — Procedures (Signed)
Patient was seen on dialysis and the procedure was supervised.  BFR 300  Via TDC BP is  192/89.   Patient appears to be tolerating treatment well second treatment ever   Louis Meckel 09/15/2019

## 2019-09-15 NOTE — Progress Notes (Signed)
Renal Navigator notified by Nephrologist/Dr. Moshe Cipro that patient needs referral for OP HD treatment. Renal Navigator met with patient at bedside to complete assessment and submitted referral for OP HD treatment to [facility] to request treatment at [facility] clinic, since this is the closest clinic to his home. Renal Navigator will continue to follow closely.  Alphonzo Cruise, [place] Renal Navigator [phone]

## 2019-09-15 NOTE — Progress Notes (Signed)
Subjective:  Feels much better he says-  Still with some facial twitching- calcium still low.   S/p first HD yest- second today   Objective Vital signs in last 24 hours: Vitals:   09/15/19 0716 09/15/19 0746 09/15/19 0816 09/15/19 0846  BP: (!) 169/105 (!) 159/108 (!) 175/106 (!) 177/100  Pulse: 92 90 94 93  Resp: 16 14 12 14  Temp:      TempSrc:      SpO2:      Weight:      Height:       Weight change: 0.317 kg  Intake/Output Summary (Last 24 hours) at 09/15/2019 0950 Last data filed at 09/14/2019 1841 Gross per 24 hour  Intake 630.34 ml  Output 0 ml  Net 630.34 ml    Assessment/Plan: 53-year-old Hispanic male with poorly controlled hypertension and advanced CKD noted in the past.  He now is at ESRD with creatinine of 22 1.Renal-creatinine of six 1 year ago.  Now creatinine of 22 with BUN of 180 and uremic symptoms.  There is no obstruction on his ultrasound.  This will not be reversible, he is now ESRD.    He is s/p a tunneled dialysis catheter 10/21 followed by his first hemodialysis- second treatment today.  Appreciate vascular for AV fistula tentatively on Friday. initiate CLIP for outpatient dialysis.  He lives in [place].  Will plan for third HD on Saturday.  If have OP spot by then could possibly go home over weekend, if not will need to stay in house  2. Hypertension/volume  - blood pressure elevated.  He is a little bit volume overloaded.  Has been ordered norvasc, hydralazine and labetalol.  Will follow, he may not need this much medicine after HD is initiated. 3.  Metabolic acidosis-should correct with dialysis 4. Anemia  -interesting that this is not more advanced.   iron stores OK, will initiate ESA when needed 5.  Bones-I think his facial twitching is from hypocalcemia, will give him some IV repletion again today.  add tums with meals for phosphate binding.  PTH 139    Kellie A [place]    Labs: Basic Metabolic Panel: Recent Labs  Lab 09/13/19 1430  09/14/19 0451 09/15/19 0700  NA 138 140 141  K 4.8 4.8 4.0  CL 101 104 103  CO2 15* 13* 21*  GLUCOSE 154* 110* 174*  BUN 191* 197* 129*  CREATININE 22.67* 23.15* 17.61*  CALCIUM 6.1* 6.3* 6.8*  PHOS 8.6* 9.3* 7.6*   Liver Function Tests: Recent Labs  Lab 09/13/19 0738 09/13/19 1430 09/14/19 0451 09/15/19 0700  AST 11*  --   --   --   ALT 10  --   --   --   ALKPHOS 54  --   --   --   BILITOT 0.8  --   --   --   PROT 6.8  --   --   --   ALBUMIN 3.2* 3.1* 3.0* 2.9*   No results for input(s): LIPASE, AMYLASE in the last 168 hours. No results for input(s): AMMONIA in the last 168 hours. CBC: Recent Labs  Lab 09/13/19 0738 09/14/19 0450 09/15/19 0700  WBC 8.7 8.1 6.5  NEUTROABS 7.5  --   --   HGB 11.4* 10.5* 9.9*  HCT 35.5* 32.4* 31.2*  MCV 76.2* 75.5* 75.9*  PLT 332 292 283   Cardiac Enzymes: No results for input(s): CKTOTAL, CKMB, CKMBINDEX, TROPONINI in the last 168 hours. CBG: No results for   input(s): GLUCAP in the last 168 hours.  Iron Studies:  Recent Labs    09/14/19 0450  IRON 108  TIBC 210*  FERRITIN 498*   Studies/Results: US Renal  Result Date: 09/13/2019 CLINICAL DATA:  53 year old male with acute chronic renal failure. EXAM: RENAL / URINARY TRACT ULTRASOUND COMPLETE COMPARISON:  Renal ultrasound dated 01/07/2018 FINDINGS: Right Kidney: Renal measurements: 9.8 x 4.0 x 5.5 cm = volume: 172 mL. There is diffuse increased renal echogenicity. No hydronephrosis or shadowing stone. There is a 1.2 x 1.3 x 1.4 cm interpolar cyst. Left Kidney: Renal measurements: 9.8 x 6.1 x 4.9 cm = volume: 152 mL. Diffuse echogenic kidney. There is a 2.8 x 1.8 x 2.2 cm upper pole cyst and a 1.3 x 1.3 x 1.1 cm lower pole cyst. No hydronephrosis or shadowing stone. Bladder: Appears normal for degree of bladder distention. Other: Prostate gland measures 4.3 x 3.5 x 4.1 cm. IMPRESSION: 1. Echogenic kidneys, likely related to underlying medical renal disease. No hydronephrosis or  shadowing stone. 2. Bilateral renal cysts. Electronically Signed   By: Anner Crete M.D.   On: 09/13/2019 11:20   Ir Fluoro Guide Cv Line Right  Result Date: 09/14/2019 INDICATION: 53 year old male with progressive renal failure in need of urgent hemodialysis. EXAM: TUNNELED CENTRAL VENOUS HEMODIALYSIS CATHETER PLACEMENT WITH ULTRASOUND AND FLUOROSCOPIC GUIDANCE MEDICATIONS: 2 g Ancef. The antibiotic was given in an appropriate time interval prior to skin puncture. ANESTHESIA/SEDATION: Moderate (conscious) sedation was employed during this procedure. A total of Versed 2 mg and Fentanyl 50 mcg was administered intravenously. Moderate Sedation Time: 20 minutes. The patient's level of consciousness and vital signs were monitored continuously by radiology nursing throughout the procedure under my direct supervision. FLUOROSCOPY TIME:  Fluoroscopy Time: 0 minutes 24 seconds (2 mGy). COMPLICATIONS: None immediate. PROCEDURE: Informed written consent was obtained from the patient after a discussion of the risks, benefits, and alternatives to treatment. Questions regarding the procedure were encouraged and answered. The right neck and chest were prepped with chlorhexidine in a sterile fashion, and a sterile drape was applied covering the operative field. Maximum barrier sterile technique with sterile gowns and gloves were used for the procedure. A timeout was performed prior to the initiation of the procedure. After creating a small venotomy incision, a micropuncture kit was utilized to access the right internal jugular vein under direct, real-time ultrasound guidance after the overlying soft tissues were anesthetized with 1% lidocaine with epinephrine. Ultrasound image documentation was performed. The microwire was kinked to measure appropriate catheter length. A stiff Glidewire was advanced to the level of the IVC and the micropuncture sheath was exchanged for a peel-away sheath. A palindrome tunneled  hemodialysis catheter measuring 23 cm from tip to cuff was tunneled in a retrograde fashion from the anterior chest wall to the venotomy incision. The catheter was then placed through the peel-away sheath with tips ultimately positioned within the superior aspect of the right atrium. Final catheter positioning was confirmed and documented with a spot radiographic image. The catheter aspirates and flushes normally. The catheter was flushed with appropriate volume heparin dwells. The catheter exit site was secured with a 0-Prolene retention suture. The venotomy incision was closed with Dermabond. Dressings were applied. The patient tolerated the procedure well without immediate post procedural complication. IMPRESSION: Successful placement of 23 cm tip to cuff tunneled hemodialysis catheter via the right internal jugular vein with tips terminating within the superior aspect of the right atrium. The catheter is ready for immediate use. Electronically  Signed   By: Heath  McCullough M.D.   On: 09/14/2019 13:52   Ir Us Guide Vasc Access Right  Result Date: 09/14/2019 INDICATION: 53-year-old male with progressive renal failure in need of urgent hemodialysis. EXAM: TUNNELED CENTRAL VENOUS HEMODIALYSIS CATHETER PLACEMENT WITH ULTRASOUND AND FLUOROSCOPIC GUIDANCE MEDICATIONS: 2 g Ancef. The antibiotic was given in an appropriate time interval prior to skin puncture. ANESTHESIA/SEDATION: Moderate (conscious) sedation was employed during this procedure. A total of Versed 2 mg and Fentanyl 50 mcg was administered intravenously. Moderate Sedation Time: 20 minutes. The patient's level of consciousness and vital signs were monitored continuously by radiology nursing throughout the procedure under my direct supervision. FLUOROSCOPY TIME:  Fluoroscopy Time: 0 minutes 24 seconds (2 mGy). COMPLICATIONS: None immediate. PROCEDURE: Informed written consent was obtained from the patient after a discussion of the risks, benefits, and  alternatives to treatment. Questions regarding the procedure were encouraged and answered. The right neck and chest were prepped with chlorhexidine in a sterile fashion, and a sterile drape was applied covering the operative field. Maximum barrier sterile technique with sterile gowns and gloves were used for the procedure. A timeout was performed prior to the initiation of the procedure. After creating a small venotomy incision, a micropuncture kit was utilized to access the right internal jugular vein under direct, real-time ultrasound guidance after the overlying soft tissues were anesthetized with 1% lidocaine with epinephrine. Ultrasound image documentation was performed. The microwire was kinked to measure appropriate catheter length. A stiff Glidewire was advanced to the level of the IVC and the micropuncture sheath was exchanged for a peel-away sheath. A palindrome tunneled hemodialysis catheter measuring 23 cm from tip to cuff was tunneled in a retrograde fashion from the anterior chest wall to the venotomy incision. The catheter was then placed through the peel-away sheath with tips ultimately positioned within the superior aspect of the right atrium. Final catheter positioning was confirmed and documented with a spot radiographic image. The catheter aspirates and flushes normally. The catheter was flushed with appropriate volume heparin dwells. The catheter exit site was secured with a 0-Prolene retention suture. The venotomy incision was closed with Dermabond. Dressings were applied. The patient tolerated the procedure well without immediate post procedural complication. IMPRESSION: Successful placement of 23 cm tip to cuff tunneled hemodialysis catheter via the right internal jugular vein with tips terminating within the superior aspect of the right atrium. The catheter is ready for immediate use. Electronically Signed   By: Heath  McCullough M.D.   On: 09/14/2019 13:52   Vas Us Upper Ext Vein Mapping  (pre-op Avf)  Result Date: 09/14/2019 UPPER EXTREMITY VEIN MAPPING  Indications: Pre-access. Comparison Study: no prior Performing Technologist: Megan Riddle RVS  Examination Guidelines: A complete evaluation includes B-mode imaging, spectral Doppler, color Doppler, and power Doppler as needed of all accessible portions of each vessel. Bilateral testing is considered an integral part of a complete examination. Limited examinations for reoccurring indications may be performed as noted. +-----------------+-------------+----------+--------+ Right Cephalic   Diameter (cm)Depth (cm)Findings +-----------------+-------------+----------+--------+ Shoulder             0.47        0.63            +-----------------+-------------+----------+--------+ Prox upper arm       0.55        0.44            +-----------------+-------------+----------+--------+ Mid upper arm        0.69          0.32            +-----------------+-------------+----------+--------+ Dist upper arm       0.52        0.25            +-----------------+-------------+----------+--------+ Antecubital fossa    0.57        0.48            +-----------------+-------------+----------+--------+ Prox forearm         0.42        0.54            +-----------------+-------------+----------+--------+ Mid forearm          0.40        0.40            +-----------------+-------------+----------+--------+ Dist forearm         0.41        0.33            +-----------------+-------------+----------+--------+ +-----------------+-------------+----------+--------+ Right Basilic    Diameter (cm)Depth (cm)Findings +-----------------+-------------+----------+--------+ Mid upper arm        0.58        1.04            +-----------------+-------------+----------+--------+ Dist upper arm       0.53        0.80            +-----------------+-------------+----------+--------+ Antecubital fossa    0.44        0.32             +-----------------+-------------+----------+--------+ Prox forearm         0.39        0.21            +-----------------+-------------+----------+--------+ Mid forearm          0.36        0.22            +-----------------+-------------+----------+--------+ Distal forearm       0.26        0.30            +-----------------+-------------+----------+--------+ +-----------------+-------------+----------+---------+ Left Cephalic    Diameter (cm)Depth (cm)Findings  +-----------------+-------------+----------+---------+ Shoulder             0.69        0.59             +-----------------+-------------+----------+---------+ Prox upper arm       0.61        0.42             +-----------------+-------------+----------+---------+ Mid upper arm        0.74        0.36             +-----------------+-------------+----------+---------+ Dist upper arm       0.69        0.28             +-----------------+-------------+----------+---------+ Antecubital fossa    0.72        0.25   branching +-----------------+-------------+----------+---------+ Prox forearm         0.26        0.49             +-----------------+-------------+----------+---------+ Mid forearm          0.27        0.50             +-----------------+-------------+----------+---------+ Dist forearm         0.34        0.38             +-----------------+-------------+----------+---------+ +-----------------+-------------+----------+--------+  Left Basilic     Diameter (cm)Depth (cm)Findings +-----------------+-------------+----------+--------+ Prox upper arm       0.64        1.34            +-----------------+-------------+----------+--------+ Mid upper arm        0.59        1.15            +-----------------+-------------+----------+--------+ Dist upper arm       0.49        0.33            +-----------------+-------------+----------+--------+ Antecubital fossa    0.52         0.25            +-----------------+-------------+----------+--------+ Prox forearm         0.44        0.29            +-----------------+-------------+----------+--------+ Mid forearm          0.27        0.30            +-----------------+-------------+----------+--------+ Distal forearm       0.31        0.23            +-----------------+-------------+----------+--------+ *See table(s) above for measurements and observations.  Diagnosing physician: Curt Jews MD Electronically signed by Curt Jews MD on 09/14/2019 at 3:51:22 PM.    Final    Medications: Infusions: . sodium chloride    . sodium chloride    . sodium chloride 20 mL/hr at 09/14/19 1328    Scheduled Medications: . amLODipine  10 mg Oral Daily  . aspirin  81 mg Oral Daily  . atorvastatin  20 mg Oral Daily  . calcium carbonate  400 mg of elemental calcium Oral TID WC  . Chlorhexidine Gluconate Cloth  6 each Topical Q0600  . Chlorhexidine Gluconate Cloth  6 each Topical Q0600  . heparin      . heparin      . heparin  5,000 Units Subcutaneous Q8H  . hydrALAZINE  75 mg Oral TID  . labetalol  100 mg Oral BID    have reviewed scheduled and prn medications.  Physical Exam: General: facial twitching- otherwise NAD- seen in dialysis Heart: RRR Lungs: mostly clear  Abdomen: soft, non tender  Extremities: min edema  Dialysis Access: TDC right chest placed 10/21    09/15/2019,9:50 AM  LOS: 2 days

## 2019-09-16 ENCOUNTER — Encounter (HOSPITAL_COMMUNITY)
Admission: EM | Disposition: A | Discharge status: Home / Self Care | Payer: Self-pay | Source: Home / Self Care | Attending: Internal Medicine

## 2019-09-16 ENCOUNTER — Encounter (HOSPITAL_COMMUNITY): Payer: Self-pay | Admitting: Certified Registered Nurse Anesthetist

## 2019-09-16 ENCOUNTER — Inpatient Hospital Stay (HOSPITAL_COMMUNITY): Payer: PRIVATE HEALTH INSURANCE

## 2019-09-16 ENCOUNTER — Inpatient Hospital Stay (HOSPITAL_COMMUNITY): Payer: PRIVATE HEALTH INSURANCE | Admitting: Certified Registered Nurse Anesthetist

## 2019-09-16 HISTORY — PX: AV FISTULA PLACEMENT: SHX1204

## 2019-09-16 LAB — CBC
HCT: 33.2 % — ABNORMAL LOW (ref 39.0–52.0)
Hemoglobin: 10.4 g/dL — ABNORMAL LOW (ref 13.0–17.0)
MCH: 24 pg — ABNORMAL LOW (ref 26.0–34.0)
MCHC: 31.3 g/dL (ref 30.0–36.0)
MCV: 76.5 fL — ABNORMAL LOW (ref 80.0–100.0)
Platelets: 276 10*3/uL (ref 150–400)
RBC: 4.34 MIL/uL (ref 4.22–5.81)
RDW: 15.1 % (ref 11.5–15.5)
WBC: 5.5 10*3/uL (ref 4.0–10.5)
nRBC: 0 % (ref 0.0–0.2)

## 2019-09-16 LAB — RENAL FUNCTION PANEL
Albumin: 3 g/dL — ABNORMAL LOW (ref 3.5–5.0)
Anion gap: 14 (ref 5–15)
BUN: 68 mg/dL — ABNORMAL HIGH (ref 6–20)
CO2: 23 mmol/L (ref 22–32)
Calcium: 7.5 mg/dL — ABNORMAL LOW (ref 8.9–10.3)
Chloride: 100 mmol/L (ref 98–111)
Creatinine, Ser: 12.22 mg/dL — ABNORMAL HIGH (ref 0.61–1.24)
GFR calc Af Amer: 5 mL/min — ABNORMAL LOW (ref >60)
GFR calc non Af Amer: 4 mL/min — ABNORMAL LOW (ref >60)
Glucose, Bld: 95 mg/dL (ref 70–99)
Phosphorus: 5.9 mg/dL — ABNORMAL HIGH (ref 2.5–4.6)
Potassium: 4.4 mmol/L (ref 3.5–5.1)
Sodium: 137 mmol/L (ref 135–145)

## 2019-09-16 LAB — GLUCOSE, CAPILLARY: Glucose-Capillary: 95 mg/dL (ref 70–99)

## 2019-09-16 SURGERY — ARTERIOVENOUS (AV) FISTULA CREATION
Anesthesia: Monitor Anesthesia Care | Site: Arm Lower | Laterality: Left

## 2019-09-16 MED ORDER — PROPOFOL 10 MG/ML IV BOLUS
INTRAVENOUS | Status: AC
  Filled 2019-09-16: qty 40

## 2019-09-16 MED ORDER — PHENYLEPHRINE 40 MCG/ML (10ML) SYRINGE FOR IV PUSH (FOR BLOOD PRESSURE SUPPORT)
PREFILLED_SYRINGE | INTRAVENOUS | Status: DC | PRN
  Administered 2019-09-16: 120 ug via INTRAVENOUS

## 2019-09-16 MED ORDER — LIDOCAINE 2% (20 MG/ML) 5 ML SYRINGE
INTRAMUSCULAR | Status: DC | PRN
  Administered 2019-09-16: 40 mg via INTRAVENOUS

## 2019-09-16 MED ORDER — PROPOFOL 10 MG/ML IV BOLUS
INTRAVENOUS | Status: DC | PRN
  Administered 2019-09-16: 120 mg via INTRAVENOUS

## 2019-09-16 MED ORDER — FENTANYL CITRATE (PF) 100 MCG/2ML IJ SOLN
INTRAMUSCULAR | Status: DC | PRN
  Administered 2019-09-16: 100 ug via INTRAVENOUS

## 2019-09-16 MED ORDER — SUCCINYLCHOLINE CHLORIDE 200 MG/10ML IV SOSY
PREFILLED_SYRINGE | INTRAVENOUS | Status: DC | PRN
  Administered 2019-09-16: 120 mg via INTRAVENOUS

## 2019-09-16 MED ORDER — SODIUM CHLORIDE 0.9 % IV SOLN
INTRAVENOUS | Status: AC
  Filled 2019-09-16: qty 1.2

## 2019-09-16 MED ORDER — SODIUM CHLORIDE 0.9 % IV SOLN
INTRAVENOUS | Status: DC | PRN
  Administered 2019-09-16: 25 ug/min via INTRAVENOUS

## 2019-09-16 MED ORDER — MIDAZOLAM HCL 2 MG/2ML IJ SOLN
INTRAMUSCULAR | Status: AC
  Filled 2019-09-16: qty 2

## 2019-09-16 MED ORDER — NEOSTIGMINE METHYLSULFATE 3 MG/3ML IV SOSY
PREFILLED_SYRINGE | INTRAVENOUS | Status: AC
  Filled 2019-09-16: qty 3

## 2019-09-16 MED ORDER — MIDAZOLAM HCL 5 MG/5ML IJ SOLN
INTRAMUSCULAR | Status: DC | PRN
  Administered 2019-09-16: 2 mg via INTRAVENOUS

## 2019-09-16 MED ORDER — DEXAMETHASONE SODIUM PHOSPHATE 10 MG/ML IJ SOLN
INTRAMUSCULAR | Status: DC | PRN
  Administered 2019-09-16: 5 mg via INTRAVENOUS

## 2019-09-16 MED ORDER — PROPOFOL 500 MG/50ML IV EMUL
INTRAVENOUS | Status: DC | PRN
  Administered 2019-09-16: 75 ug/kg/min via INTRAVENOUS

## 2019-09-16 MED ORDER — SODIUM CHLORIDE 0.9 % IV SOLN
INTRAVENOUS | Status: DC | PRN
  Administered 2019-09-16: 07:00:00 via INTRAVENOUS

## 2019-09-16 MED ORDER — SODIUM CHLORIDE 0.9 % IV SOLN
INTRAVENOUS | Status: DC | PRN
  Administered 2019-09-16: 500 mL

## 2019-09-16 MED ORDER — HYDRALAZINE HCL 50 MG PO TABS
100.0000 mg | ORAL_TABLET | Freq: Three times a day (TID) | ORAL | Status: DC
  Administered 2019-09-16: 100 mg via ORAL
  Filled 2019-09-16 (×2): qty 2

## 2019-09-16 MED ORDER — FENTANYL CITRATE (PF) 250 MCG/5ML IJ SOLN
INTRAMUSCULAR | Status: AC
  Filled 2019-09-16: qty 5

## 2019-09-16 MED ORDER — HEPARIN SODIUM (PORCINE) 1000 UNIT/ML IJ SOLN
INTRAMUSCULAR | Status: DC | PRN
  Administered 2019-09-16: 5000 [IU] via INTRAVENOUS

## 2019-09-16 MED ORDER — NEOSTIGMINE METHYLSULFATE 10 MG/10ML IV SOLN
INTRAVENOUS | Status: DC | PRN
  Administered 2019-09-16: 3 mg via INTRAVENOUS

## 2019-09-16 MED ORDER — PHENYLEPHRINE 40 MCG/ML (10ML) SYRINGE FOR IV PUSH (FOR BLOOD PRESSURE SUPPORT)
PREFILLED_SYRINGE | INTRAVENOUS | Status: AC
  Filled 2019-09-16: qty 10

## 2019-09-16 MED ORDER — 0.9 % SODIUM CHLORIDE (POUR BTL) OPTIME
TOPICAL | Status: DC | PRN
  Administered 2019-09-16: 1000 mL

## 2019-09-16 MED ORDER — ROCURONIUM BROMIDE 10 MG/ML (PF) SYRINGE
PREFILLED_SYRINGE | INTRAVENOUS | Status: DC | PRN
  Administered 2019-09-16: 30 mg via INTRAVENOUS

## 2019-09-16 MED ORDER — GLYCOPYRROLATE 0.2 MG/ML IJ SOLN
INTRAMUSCULAR | Status: DC | PRN
  Administered 2019-09-16: 0.6 mg via INTRAVENOUS

## 2019-09-16 MED ORDER — LIDOCAINE HCL (PF) 1 % IJ SOLN
INTRAMUSCULAR | Status: DC | PRN
  Administered 2019-09-16: 30 mL

## 2019-09-16 MED ORDER — LIDOCAINE HCL (PF) 1 % IJ SOLN
INTRAMUSCULAR | Status: AC
  Filled 2019-09-16: qty 30

## 2019-09-16 MED ORDER — LABETALOL HCL 200 MG PO TABS
200.0000 mg | ORAL_TABLET | Freq: Two times a day (BID) | ORAL | Status: DC
  Administered 2019-09-16 – 2019-09-17 (×2): 200 mg via ORAL
  Filled 2019-09-16 (×2): qty 1

## 2019-09-16 MED ORDER — GLYCOPYRROLATE PF 0.2 MG/ML IJ SOSY
PREFILLED_SYRINGE | INTRAMUSCULAR | Status: AC
  Filled 2019-09-16: qty 2

## 2019-09-16 MED ORDER — ONDANSETRON HCL 4 MG/2ML IJ SOLN
INTRAMUSCULAR | Status: DC | PRN
  Administered 2019-09-16: 4 mg via INTRAVENOUS

## 2019-09-16 SURGICAL SUPPLY — 31 items
ARMBAND PINK RESTRICT EXTREMIT (MISCELLANEOUS) ×3 IMPLANT
CANISTER SUCT 3000ML PPV (MISCELLANEOUS) ×2 IMPLANT
CANNULA VESSEL 3MM 2 BLNT TIP (CANNULA) ×2 IMPLANT
CLIP VESOCCLUDE MED 6/CT (CLIP) ×2 IMPLANT
CLIP VESOCCLUDE SM WIDE 6/CT (CLIP) ×2 IMPLANT
COVER PROBE W GEL 5X96 (DRAPES) ×1 IMPLANT
COVER WAND RF STERILE (DRAPES) ×1 IMPLANT
DECANTER SPIKE VIAL GLASS SM (MISCELLANEOUS) ×2 IMPLANT
DERMABOND ADVANCED (GAUZE/BANDAGES/DRESSINGS) ×1
DERMABOND ADVANCED .7 DNX12 (GAUZE/BANDAGES/DRESSINGS) ×1 IMPLANT
DRAIN PENROSE 1/4X12 LTX STRL (WOUND CARE) ×2 IMPLANT
ELECT REM PT RETURN 9FT ADLT (ELECTROSURGICAL) ×2
ELECTRODE REM PT RTRN 9FT ADLT (ELECTROSURGICAL) ×1 IMPLANT
GLOVE BIO SURGEON STRL SZ7.5 (GLOVE) ×3 IMPLANT
GOWN STRL REUS W/ TWL LRG LVL3 (GOWN DISPOSABLE) ×3 IMPLANT
GOWN STRL REUS W/TWL LRG LVL3 (GOWN DISPOSABLE) ×3
KIT BASIN OR (CUSTOM PROCEDURE TRAY) ×2 IMPLANT
KIT TURNOVER KIT B (KITS) ×2 IMPLANT
LOOP VESSEL MINI RED (MISCELLANEOUS) IMPLANT
NS IRRIG 1000ML POUR BTL (IV SOLUTION) ×2 IMPLANT
PACK CV ACCESS (CUSTOM PROCEDURE TRAY) ×2 IMPLANT
PAD ARMBOARD 7.5X6 YLW CONV (MISCELLANEOUS) ×4 IMPLANT
SPONGE SURGIFOAM ABS GEL 100 (HEMOSTASIS) IMPLANT
SUT PROLENE 6 0 BV (SUTURE) ×2 IMPLANT
SUT PROLENE 7 0 BV 1 (SUTURE) ×2 IMPLANT
SUT VIC AB 3-0 SH 27 (SUTURE) ×1
SUT VIC AB 3-0 SH 27X BRD (SUTURE) ×1 IMPLANT
SUT VICRYL 4-0 PS2 18IN ABS (SUTURE) ×2 IMPLANT
TOWEL GREEN STERILE (TOWEL DISPOSABLE) ×2 IMPLANT
UNDERPAD 30X30 (UNDERPADS AND DIAPERS) ×2 IMPLANT
WATER STERILE IRR 1000ML POUR (IV SOLUTION) ×2 IMPLANT

## 2019-09-16 NOTE — Transfer of Care (Signed)
Immediate Anesthesia Transfer of Care Note  Patient: Fred Harrison  Procedure(s) Performed: ARTERIOVENOUS (AV) FISTULA CREATION (Left Arm Lower)  Patient Location: PACU  Anesthesia Type:General  Level of Consciousness: drowsy  Airway & Oxygen Therapy: Patient Spontanous Breathing and Patient connected to face mask oxygen  Post-op Assessment: Report given to RN and Post -op Vital signs reviewed and stable  Post vital signs: Reviewed and stable  Last Vitals:  Vitals Value Taken Time  BP    Temp    Pulse    Resp    SpO2      Last Pain:  Vitals:   09/16/19 0607  TempSrc: Oral  PainSc:          Complications: Loss of front left tooth.

## 2019-09-16 NOTE — Progress Notes (Signed)
Palpable thrill in fistula no numbness or tingling in hand no hematoma  We will set up follow-up for the patient in about 6 weeks.  Ruta Hinds, MD [facility] Office: [phone]

## 2019-09-16 NOTE — Progress Notes (Signed)
[facility]    NEPHROLOGY PROGRESS NOTE  SUBJECTIVE: Patient seen and examined.  No acute complaints.  Denies chest pain, shortness of breath, nausea, vomiting, diarrhea or dysuria.  All other review of systems are negative.  Status post left forearm AV fistula today.   OBJECTIVE:  Vitals:   09/16/19 1039 09/16/19 1309  BP: (!) 186/92 (!) 174/64  Pulse: 96 80  Resp: 18 18  Temp: 98.4 F (36.9 C) 98.6 F (37 C)  SpO2: 96% 97%    Intake/Output Summary (Last 24 hours) at 09/16/2019 1622 Last data filed at 09/16/2019 1300 Gross per 24 hour  Intake 610 ml  Output -  Net 610 ml      General:  AAOx3 NAD HEENT: MMM  AT anicteric sclera Neck:  No JVD, no adenopathy CV:  Heart RRR  Lungs:  L/S CTA bilaterally Abd:  abd SNT/ND with normal BS GU:  Bladder non-palpable Extremities:  No LE edema, left forearm AV fistula with a good bruit Skin:  No skin rash  MEDICATIONS:  . amLODipine  10 mg Oral Daily  . aspirin  81 mg Oral Daily  . atorvastatin  20 mg Oral Daily  . calcium carbonate  800 mg of elemental calcium Oral TID WC  . Chlorhexidine Gluconate Cloth  6 each Topical Q0600  . Chlorhexidine Gluconate Cloth  6 each Topical Q0600  . heparin  5,000 Units Subcutaneous Q8H  . hydrALAZINE  100 mg Oral TID  . influenza vaccine adjuvanted  0.5 mL Intramuscular Tomorrow-1000  . labetalol  100 mg Oral BID       LABS:   CBC Latest Ref Rng & Units 09/16/2019 09/15/2019 09/14/2019  WBC 4.0 - 10.5 K/uL 5.5 6.5 8.1  Hemoglobin 13.0 - 17.0 g/dL 10.4(L) 9.9(L) 10.5(L)  Hematocrit 39.0 - 52.0 % 33.2(L) 31.2(L) 32.4(L)  Platelets 150 - 400 K/uL 276 283 292    CMP Latest Ref Rng & Units 09/16/2019 09/15/2019 09/14/2019  Glucose 70 - 99 mg/dL 95 174(H) 110(H)  BUN 6 - 20 mg/dL 68(H) 129(H) 197(H)  Creatinine 0.61 - 1.24 mg/dL 12.22(H) 17.61(H) 23.15(H)  Sodium 135 - 145 mmol/L 137 141 140  Potassium 3.5 - 5.1 mmol/L 4.4 4.0 4.8  Chloride 98 - 111 mmol/L 100  103 104  CO2 22 - 32 mmol/L 23 21(L) 13(L)  Calcium 8.9 - 10.3 mg/dL 7.5(L) 6.8(L) 6.3(LL)  Total Protein 6.5 - 8.1 g/dL - - -  Total Bilirubin 0.3 - 1.2 mg/dL - - -  Alkaline Phos 38 - 126 U/L - - -  AST 15 - 41 U/L - - -  ALT 0 - 44 U/L - - -    Lab Results  Component Value Date   PTH 139 (H) 09/14/2019   CALCIUM 7.5 (L) 09/16/2019   PHOS 5.9 (H) 09/16/2019       Component Value Date/Time   COLORURINE YELLOW 08/03/2016 1335   APPEARANCEUR CLEAR 08/03/2016 1335   LABSPEC 1.016 08/03/2016 1335   PHURINE 7.0 08/03/2016 1335   GLUCOSEU 100 (A) 08/03/2016 1335   HGBUR TRACE (A) 08/03/2016 1335   BILIRUBINUR NEGATIVE 08/03/2016 1335   [place] 08/03/2016 1335   PROTEINUR >300 (A) 08/03/2016 1335   NITRITE NEGATIVE 08/03/2016 1335   LEUKOCYTESUR NEGATIVE 08/03/2016 1335   No results found for: PHART, PCO2ART, PO2ART, HCO3, TCO2, ACIDBASEDEF, O2SAT     Component Value Date/Time   IRON 108 09/14/2019 0450   TIBC 210 (L) 09/14/2019 0450   FERRITIN 498 (  H) 09/14/2019 0450   IRONPCTSAT 51 (H) 09/14/2019 0450       ASSESSMENT/PLAN:    53 year old Hispanic male with poorly controlled hypertension and advanced CKD noted in the past. He now is at ESRD with creatinine of 22  1.  End-stage renal disease.  He is status post tunneled dialysis catheter on 10/21 and left forearm AV fistula today.  Third dialysis treatment for tomorrow.  Okay for disposition after dialysis tomorrow.  Has a Monday, Wednesday, Friday dialysis seat.  2.  Hypertension.  Will increase labetalol.  Should improve with dialysis.  3.  Metabolic acidosis.  Should also improve with dialysis.  4.  Anemia.  No indication for ESA's currently.  5.  Metabolic bone disease.  Calcium improved.  Phosphorus trending down.  Continue calcium carbonate.      [place], DO, [place]

## 2019-09-16 NOTE — Progress Notes (Signed)
Subjective:  Patient is currently being managed for acute on chronic renal failure. He received his second round of HD yesterday and states he felt much better immediately after the session. He states his facial twitching resolved after dialysis and have not returned. Approximately 1.3L of fluid was removed through HD yesterday. He had no acute events overnight, BP was running hypertensive at [id number] systolic.This morning he was taken for his scheduled AVF creation in the L wrist. Surgery lasted approximately 1.5 hours. No major operative complications but he had 2 front teeth knocked out during the intubation procedure. This does not seem to be giving him any distress. Since surgery, his BP have been slowly trending up to be [id number] systolic. He has no new complaints of dizziness, n/v, headache, chest pain, dyspnea.   Objective:  Vital signs in last 24 hours: Vitals:   09/16/19 0938 09/16/19 0953 09/16/19 1008 09/16/19 1039  BP: (!) 198/93 (!) 203/101 (!) 187/87 (!) 186/92  Pulse: 99 94 99 96  Resp: 15 20 16 18   Temp:   98.1 F (36.7 C) 98.4 F (36.9 C)  TempSrc:    Oral  SpO2: 97% 100% 96% 96%  Weight:      Height:       CBC Latest Ref Rng & Units 09/16/2019 09/15/2019 09/14/2019  WBC 4.0 - 10.5 K/uL 5.5 6.5 8.1  Hemoglobin 13.0 - 17.0 g/dL 10.4(L) 9.9(L) 10.5(L)  Hematocrit 39.0 - 52.0 % 33.2(L) 31.2(L) 32.4(L)  Platelets 150 - 400 K/uL 276 283 292   BMP Latest Ref Rng & Units 09/16/2019 09/15/2019 09/14/2019  Glucose 70 - 99 mg/dL 95 174(H) 110(H)  BUN 6 - 20 mg/dL 68(H) 129(H) 197(H)  Creatinine 0.61 - 1.24 mg/dL 12.22(H) 17.61(H) 23.15(H)  BUN/Creat Ratio 9 - 20 - - -  Sodium 135 - 145 mmol/L 137 141 140  Potassium 3.5 - 5.1 mmol/L 4.4 4.0 4.8  Chloride 98 - 111 mmol/L 100 103 104  CO2 22 - 32 mmol/L 23 21(L) 13(L)  Calcium 8.9 - 10.3 mg/dL 7.5(L) 6.8(L) 6.3(LL)    Intake/Output Summary (Last 24 hours) at 09/16/2019 1239 Last data filed at 09/16/2019 1200 Gross per 24  hour  Intake 717.05 ml  Output -  Net 717.05 ml   Physical Exam:  General: NAD, A&Ox3, facial twitching no longer present HEENT: Firm 4 cm nodule on head,  Cardio: RRR, tachycardia present, no m/r/g, palpable thrill, continuous bruit at fistula Pulm: CTAB, no w/r/r, breathing effort normal  Abdominal: active bowel sounds, soft abdomen, no distension, no abdominal tenderness. Skin: Warm and dry  Extremities: 3 cm sutured wound on L wrist, 1+ LE edema bilaterally  Neuro: No focal neuro defecits, A&Ox3,   Assessment/Plan:  Patient is a 53 yo. M with h/o poorly controlled HTN, CKD IV here for management of uremia and acute on chronic renal failure. He has received 2 rounds HD [facility] (placed 10/21) and had L wrist AVF created 10/23. Overall he is doing much better following dialysis and labs are trending towards normal.      Principal Problem:  1. Acute on chronic renal failure   His morning labs show reduced BUN from yesterday (68 from 129) and Cr (12 from 17) following second HD session yesterday. His sx of facial twitching seems to have resolved completely for >24 hours. He states he is feeling much better following treatment and clinical improvement was seen on the visit.   Vascular surgery performed AVF creation on L wrist today. He  has a good thrill and no signs of surgical site infection. They have checked on him post op and will otherwise see him outpatient in 6 weeks.    Continue with renal diet with fluid restriction 1200 ml.   Renal navigator has found him a placement at [facility] MWF at 12:50 for outpatient HD. We will wait for nephrology to see him and will plan for d/c tomorrow.   2. HTN  He is on (labetalol 100 mg BID, amlodipine 10 mg, hydralazine 75 mg TID) but he continues to be very hypertensive, systolic>180 reaching [id number] on occasion. He has not had significant improvement in BP after two rounds of HD. Nephrology has suggested we increase his BP medication. We will  increase his hydralazine to 100 mg TID and monitor overnight for improvement.   3. Hypocalciemia   He has been given 5g total IV calcium since hospitalization is on oral calcium pills. Ca continues to trend up and is at 7.5 today from 6.8. He is no longer symptomatic with twitching but is still slightly hypocalcemic. We will continue to replenish calcium with oral calcium (TUMS) 800 mg.    LOS: 3 days   Melina Modena, Medical Student 09/16/2019, 12:39 PM

## 2019-09-16 NOTE — Op Note (Signed)
Procedure: Left Radial Cephalic AV fistula   Preop: ESRD   Postop: ESRD   Anesthesia: General  Assistant: Gaye Alken RNFA   Findings: 3 mm cephalic vein       3 mm radial artery   Procedure Details:  The left upper extremity was prepped and draped in usual sterile fashion. Local anesthesia was infiltrated midway between the cephalic and radial artery anatomically. A longitudinal skin incision was then made in this location at the distal left forearm. The incision was carried into the subcutaneous tissues down to level cephalic vein.  The patient became agitated at this point so case was converted from [place] to general.  After the ET tube was secure, the operation continued.   The vein had some spasm but was overall reasonable quality accepting a 5 mm dilator. The vein was dissected free circumferentially and small side branches ligated and divided between silk ties. The distal end was ligated and the vein probed and found to accept up to a 5 mm dilator. This was gently distended with heparinized saline, spatulated, and marked for orientation. Next the radial artery was dissected free in the medial portion incision. The artery was 3 mm in diameter and had a good pulse. The vessel loops were placed proximal and distal to the planned site of arteriotomy. The patient was given 5000 units of intravenous heparin. After appropriate circulation time, the small bulldog clamps were used to control the artery. A longitudinal opening was made in the left radial artery. The vein was controlled proximally with a fine bulldog clamp. The vein was then swung over to the artery and sewn end of vein to side of artery using a running 6-0 Prolene suture. Just prior to completion, the anastomosis was fore bled back bled and thoroughly flushed. The anastomosis was secured, vessel loops released, and there was a palpable thrill in the fistula immediately. After hemostasis was obtained, the subcutaneous tissues were  reapproximated using a running 3-0 Vicryl suture. The skin was then closed with a 4 Vicryl subcuticular stitch. Dermabond was applied to the skin incision. The patient tolerated the procedure well and there were no complications. Instrument sponge and needle count were correct at the end of the case. The patient was taken to PACU in stable condition.   A tooth was lost during control of the airway.  Workup post op per anesthesia.  Ruta Hinds, MD  [facility]  Office: [phone]  Pager: [phone]

## 2019-09-16 NOTE — Anesthesia Procedure Notes (Signed)
Procedure Name: MAC Date/Time: 09/16/2019 7:44 AM Performed by: Colin Benton, CRNA Pre-anesthesia Checklist: Patient identified, Emergency Drugs available, Suction available and Patient being monitored Patient Re-evaluated:Patient Re-evaluated prior to induction Oxygen Delivery Method: Simple face mask Induction Type: IV induction Placement Confirmation: positive ETCO2 Dental Injury: Teeth and Oropharynx as per pre-operative assessment

## 2019-09-16 NOTE — Plan of Care (Signed)
Patient stable, discussed POC with patient, agreeable with plan, denies question/concerns at this time.    Problem: Health Behavior/Discharge Planning: Goal: Ability to manage health-related needs will improve Outcome: Progressing

## 2019-09-16 NOTE — Progress Notes (Signed)
Patient has been accepted for OP HD treatment at [facility] on a MWF schedule with a seat time of 12:50pm. He needs to arrive 20 minutes early to his appointments. On his first day of treatment, he needs to arrive at 12:00pm to sign consents prior to his first treatment.  Renal Navigator notified Nephrologist/Dr. Maryjane Hurter and will meet with patient to provide information verbally and in writing. Renal Navigator will follow for discharge plan and notify OP HD clinic of start date.  Alphonzo Cruise,  Renal Navigator [phone]

## 2019-09-16 NOTE — Anesthesia Procedure Notes (Signed)
Procedure Name: Intubation Date/Time: 09/16/2019 8:07 AM Performed by: Colin Benton, CRNA Pre-anesthesia Checklist: Patient identified, Emergency Drugs available, Suction available and Patient being monitored Patient Re-evaluated:Patient Re-evaluated prior to induction Oxygen Delivery Method: Circle system utilized Preoxygenation: Pre-oxygenation with 100% oxygen Induction Type: IV induction Ventilation: Mask ventilation without difficulty Laryngoscope Size: Glidescope and 4 Grade View: Grade I Tube type: Oral Tube size: 7.5 mm Number of attempts: 1 Airway Equipment and Method: Video-laryngoscopy and Rigid stylet Placement Confirmation: ETT inserted through vocal cords under direct vision,  positive ETCO2 and breath sounds checked- equal and bilateral Secured at: 23 cm Tube secured with: Tape Dental Injury: Teeth and Oropharynx as per pre-operative assessment

## 2019-09-16 NOTE — Anesthesia Postprocedure Evaluation (Signed)
Anesthesia Post Note  Patient: Fred Harrison  Procedure(s) Performed: ARTERIOVENOUS (AV) FISTULA CREATION (Left Arm Lower)     Patient location during evaluation: PACU Anesthesia Type: General Level of consciousness: awake and alert Pain management: pain level controlled Vital Signs Assessment: post-procedure vital signs reviewed and stable Respiratory status: spontaneous breathing, nonlabored ventilation, respiratory function stable and patient connected to nasal cannula oxygen Cardiovascular status: blood pressure returned to baseline and stable Postop Assessment: no apparent nausea or vomiting Anesthetic complications: yes Anesthetic complication details: dental injury. Front tooth knocked out from oral airway manipulation. and anesthesia complications   Last Vitals:  Vitals:   09/16/19 1039 09/16/19 1309  BP: (!) 186/92 (!) 174/64  Pulse: 96 80  Resp: 18 18  Temp: 36.9 C 37 C  SpO2: 96% 97%    Last Pain:  Vitals:   09/16/19 1309  TempSrc: Oral  PainSc:                  Tiajuana Amass

## 2019-09-16 NOTE — Interval H&P Note (Signed)
History and Physical Interval Note:  09/16/2019 7:18 AM  Fred Harrison  has presented today for surgery, with the diagnosis of end stage renal disease.  The various methods of treatment have been discussed with the patient and family. After consideration of risks, benefits and other options for treatment, the patient has consented to  Procedure(s): ARTERIOVENOUS (AV) FISTULA CREATION (Left) as a surgical intervention.  The patient's history has been reviewed, patient examined, no change in status, stable for surgery.  I have reviewed the patient's chart and labs.  Questions were answered to the patient's satisfaction.     Ruta Hinds

## 2019-09-17 ENCOUNTER — Encounter (HOSPITAL_COMMUNITY): Payer: Self-pay | Admitting: Vascular Surgery

## 2019-09-17 DIAGNOSIS — D649 Anemia, unspecified: Secondary | ICD-10-CM

## 2019-09-17 DIAGNOSIS — M899 Disorder of bone, unspecified: Secondary | ICD-10-CM

## 2019-09-17 LAB — CBC
HCT: 30.1 % — ABNORMAL LOW (ref 39.0–52.0)
Hemoglobin: 9.3 g/dL — ABNORMAL LOW (ref 13.0–17.0)
MCH: 24.4 pg — ABNORMAL LOW (ref 26.0–34.0)
MCHC: 30.9 g/dL (ref 30.0–36.0)
MCV: 79 fL — ABNORMAL LOW (ref 80.0–100.0)
Platelets: 264 10*3/uL (ref 150–400)
RBC: 3.81 MIL/uL — ABNORMAL LOW (ref 4.22–5.81)
RDW: 15 % (ref 11.5–15.5)
WBC: 6.5 10*3/uL (ref 4.0–10.5)
nRBC: 0 % (ref 0.0–0.2)

## 2019-09-17 LAB — RENAL FUNCTION PANEL
Albumin: 3.1 g/dL — ABNORMAL LOW (ref 3.5–5.0)
Albumin: 2.9 g/dL — ABNORMAL LOW (ref 3.5–5.0)
Anion gap: 16 — ABNORMAL HIGH (ref 5–15)
Anion gap: 16 — ABNORMAL HIGH (ref 5–15)
BUN: 84 mg/dL — ABNORMAL HIGH (ref 6–20)
BUN: 86 mg/dL — ABNORMAL HIGH (ref 6–20)
CO2: 22 mmol/L (ref 22–32)
CO2: 22 mmol/L (ref 22–32)
Calcium: 7.1 mg/dL — ABNORMAL LOW (ref 8.9–10.3)
Calcium: 6.9 mg/dL — ABNORMAL LOW (ref 8.9–10.3)
Chloride: 97 mmol/L — ABNORMAL LOW (ref 98–111)
Chloride: 97 mmol/L — ABNORMAL LOW (ref 98–111)
Creatinine, Ser: 14.63 mg/dL — ABNORMAL HIGH (ref 0.61–1.24)
Creatinine, Ser: 14.53 mg/dL — ABNORMAL HIGH (ref 0.61–1.24)
GFR calc Af Amer: 4 mL/min — ABNORMAL LOW (ref >60)
GFR calc Af Amer: 4 mL/min — ABNORMAL LOW (ref >60)
GFR calc non Af Amer: 3 mL/min — ABNORMAL LOW (ref >60)
GFR calc non Af Amer: 3 mL/min — ABNORMAL LOW (ref >60)
Glucose, Bld: 96 mg/dL (ref 70–99)
Glucose, Bld: 159 mg/dL — ABNORMAL HIGH (ref 70–99)
Phosphorus: 6.7 mg/dL — ABNORMAL HIGH (ref 2.5–4.6)
Phosphorus: 6.8 mg/dL — ABNORMAL HIGH (ref 2.5–4.6)
Potassium: 4.5 mmol/L (ref 3.5–5.1)
Potassium: 4.1 mmol/L (ref 3.5–5.1)
Sodium: 135 mmol/L (ref 135–145)
Sodium: 135 mmol/L (ref 135–145)

## 2019-09-17 MED ORDER — LABETALOL HCL 200 MG PO TABS: 200.0000 mg | ORAL_TABLET | Freq: Two times a day (BID) | ORAL | 0 refills | Status: DC

## 2019-09-17 MED ORDER — ALTEPLASE 2 MG IJ SOLR: 2.0000 mg | Freq: Once | INTRAMUSCULAR | Status: DC | PRN

## 2019-09-17 MED ORDER — ISOSORBIDE MONONITRATE ER 30 MG PO TB24: 30.0000 mg | ORAL_TABLET | Freq: Every day | ORAL | 2 refills | Status: DC

## 2019-09-17 MED ORDER — SODIUM CHLORIDE 0.9 % IV SOLN: 100.0000 mL | INTRAVENOUS | Status: DC | PRN

## 2019-09-17 MED ORDER — LOSARTAN POTASSIUM 50 MG PO TABS: 50.0000 mg | ORAL_TABLET | Freq: Every day | ORAL | 0 refills | Status: DC

## 2019-09-17 MED ORDER — HYDRALAZINE HCL 100 MG PO TABS: 100.0000 mg | ORAL_TABLET | Freq: Three times a day (TID) | ORAL | 0 refills | Status: DC

## 2019-09-17 MED ORDER — CALCIUM CARBONATE ANTACID 500 MG PO CHEW: 800.0000 mg | CHEWABLE_TABLET | Freq: Three times a day (TID) | ORAL | 0 refills | Status: DC

## 2019-09-17 MED ORDER — HEPARIN SODIUM (PORCINE) 1000 UNIT/ML IJ SOLN
INTRAMUSCULAR | Status: AC
  Filled 2019-09-17: qty 4

## 2019-09-17 MED ORDER — LOSARTAN POTASSIUM 50 MG PO TABS
50.0000 mg | ORAL_TABLET | Freq: Every day | ORAL | Status: DC
  Administered 2019-09-17: 50 mg via ORAL
  Filled 2019-09-17: qty 1

## 2019-09-17 NOTE — Progress Notes (Signed)
Pt IV removed, catheter intact and telemetry removed Pt has all belongings including printed prescriptions  Discharge education went over at bedside  Pt has no questions  Awaiting transportation

## 2019-09-17 NOTE — Progress Notes (Signed)
   Subjective: Patient was seen and evaluated at bedside on morning rounds. No acute events overnight. He does not have any complaint and mentions that he feels much better.  We discussed the importance of continuing hemodialysis regularly at outpatient center that has been discussed with him.  He is aware about that and endorses that he will follow.   Objective:  Vital signs in last 24 hours: Vitals:   09/16/19 1309 09/16/19 1731 09/17/19 0038 09/17/19 0618  BP: (!) 174/64 (!) 170/85 (!) 163/72 (!) 189/89  Pulse: 80 91 85 91  Resp: 18 16 17 17   Temp: 98.6 F (37 C) 98.5 F (36.9 C) 98.4 F (36.9 C) 98.1 F (36.7 C)  TempSrc: Oral Oral Oral Oral  SpO2: 97% 95% 97% 95%  Weight:      Height:       Physical Exam  Constitutional: He is oriented to person, place, and time. He appears well-developed and well-nourished.  Cardiovascular:  No murmur heard. Thrill at new left AV fistula on left wrist  Respiratory: Effort normal and breath sounds normal. No respiratory distress. He has no wheezes. He has no rales.  Neurological: He is alert and oriented to person, place, and time.  Skin: Skin is warm. No erythema.    Assessment/Plan:  Principal Problem:   Acute on chronic renal failure (HCC) Active Problems:   HTN (hypertension)  53 year old male with poorly controlled HTN and advanced CKD, presented with BUN 187, Cr of 22, K 4.7, and admitted for new onset ESRD and for urgent HD.    1.  ESRD: S/p 2 HD sessions at hospital for the first time. No urine yet (He is status post tunneled dialysis catheter on 10/21 and left forearm AV fistula 10/24. )Has a good thrill on the area. Patient also had hypopcalcemia and received IV and Po ca per nephro. No muscle twitching today.Today's lab is not collected yet.   -Will get the third HD today and if BP and Ca stable, (lab pending) will DC home to continue HD at out pt center MWF.  -Appreciate nephrology follow up -F/u Ca result  2.   Hypertension.  Not improved with HD so far. On labetalol 200 mg BID (increased yesterday), amlodipine 5 mg QD and Hydralazin (incresed to 100 mg BID yesterday).  -BP today at 180s/80s, PR 90.  -Continue Amlodipine, Hydralazine and labtalole -May increase abetalol to 300 BID if no improvement -Appreciate nephrology rec as well   Dispo: Anticipated discharge in approximately 0-1 day  Dewayne Hatch, MD 09/17/2019, 6:43 AM Pager: [phone]

## 2019-09-17 NOTE — Progress Notes (Signed)
[facility]    NEPHROLOGY PROGRESS NOTE  SUBJECTIVE: Patient seen and examined.  Resting on dialysis.  No complaints chest pain, shortness of breath, nausea, vomiting, diarrhea or dysuria.  All other review of systems are negative.    OBJECTIVE:  Vitals:   09/17/19 0908 09/17/19 0938  BP: (!) 192/101 (!) 190/95  Pulse: 87 82  Resp: 14 16  Temp:    SpO2:      Intake/Output Summary (Last 24 hours) at 09/17/2019 0951 Last data filed at 09/16/2019 1300 Gross per 24 hour  Intake 240 ml  Output -  Net 240 ml      General:  AAOx3 NAD HEENT: MMM [place] AT anicteric sclera Neck:  No JVD, no adenopathy CV:  Heart RRR  Lungs:  L/S CTA bilaterally Abd:  abd SNT/ND with normal BS GU:  Bladder non-palpable Extremities:  No LE edema, left forearm AV fistula with a good bruit Skin:  No skin rash  MEDICATIONS:  . amLODipine  10 mg Oral Daily  . aspirin  81 mg Oral Daily  . atorvastatin  20 mg Oral Daily  . calcium carbonate  800 mg of elemental calcium Oral TID WC  . Chlorhexidine Gluconate Cloth  6 each Topical Q0600  . heparin  5,000 Units Subcutaneous Q8H  . hydrALAZINE  100 mg Oral TID  . influenza vaccine adjuvanted  0.5 mL Intramuscular Tomorrow-1000  . labetalol  200 mg Oral BID       LABS:   CBC Latest Ref Rng & Units 09/16/2019 09/15/2019 09/14/2019  WBC 4.0 - 10.5 K/uL 5.5 6.5 8.1  Hemoglobin 13.0 - 17.0 g/dL 10.4(L) 9.9(L) 10.5(L)  Hematocrit 39.0 - 52.0 % 33.2(L) 31.2(L) 32.4(L)  Platelets 150 - 400 K/uL 276 283 292    CMP Latest Ref Rng & Units 09/17/2019 09/16/2019 09/15/2019  Glucose 70 - 99 mg/dL 96 95 174(H)  BUN 6 - 20 mg/dL 84(H) 68(H) 129(H)  Creatinine 0.61 - 1.24 mg/dL 14.63(H) 12.22(H) 17.61(H)  Sodium 135 - 145 mmol/L 135 137 141  Potassium 3.5 - 5.1 mmol/L 4.5 4.4 4.0  Chloride 98 - 111 mmol/L 97(L) 100 103  CO2 22 - 32 mmol/L 22 23 21(L)  Calcium 8.9 - 10.3 mg/dL 7.1(L) 7.5(L) 6.8(L)  Total Protein 6.5 - 8.1 g/dL - - -  Total  Bilirubin 0.3 - 1.2 mg/dL - - -  Alkaline Phos 38 - 126 U/L - - -  AST 15 - 41 U/L - - -  ALT 0 - 44 U/L - - -    Lab Results  Component Value Date   PTH 139 (H) 09/14/2019   CALCIUM 7.1 (L) 09/17/2019   PHOS 6.7 (H) 09/17/2019       Component Value Date/Time   COLORURINE YELLOW 08/03/2016 1335   APPEARANCEUR CLEAR 08/03/2016 1335   LABSPEC 1.016 08/03/2016 1335   PHURINE 7.0 08/03/2016 1335   GLUCOSEU 100 (A) 08/03/2016 1335   HGBUR TRACE (A) 08/03/2016 1335   BILIRUBINUR NEGATIVE 08/03/2016 1335   KETONESUR NEGATIVE 08/03/2016 1335   PROTEINUR >300 (A) 08/03/2016 1335   NITRITE NEGATIVE 08/03/2016 1335   LEUKOCYTESUR NEGATIVE 08/03/2016 1335   No results found for: PHART, PCO2ART, PO2ART, HCO3, TCO2, ACIDBASEDEF, O2SAT     Component Value Date/Time   IRON 108 09/14/2019 0450   TIBC 210 (L) 09/14/2019 0450   FERRITIN 498 (H) 09/14/2019 0450   IRONPCTSAT 51 (H) 09/14/2019 0450       ASSESSMENT/PLAN:    53 year old Hispanic  male with poorly controlled hypertension and advanced CKD noted in the past. He now is at ESRD with creatinine of 22  1.  End-stage renal disease.  He is status post tunneled dialysis catheter on 10/21 and left forearm AV fistula today.  Third dialysis treatment for tomorrow.  Okay for disposition after dialysis today.  Has a Monday, Wednesday, Friday dialysis seat.  2.  Hypertension.  Increase labetalol.  We will add losartan.  3.  Metabolic acidosis.  Should also improve with dialysis.  4.  Anemia.  No indication for ESA's currently.  5.  Metabolic bone disease.  Calcium improved.  Phosphorus trending down.  Continue calcium carbonate.      [place], DO, [place]

## 2019-09-17 NOTE — Progress Notes (Signed)
Informed MD of pt BP  MD stated to hold hydralazine  Educated pt on new medications and next doses  Pt verbalizes understanding  Pt has all belongings  Pt discharged via wheelchair with RN

## 2019-09-17 NOTE — Discharge Instructions (Signed)
End-Stage Kidney Disease End-stage kidney disease occurs when the kidneys are so damaged that they cannot function and cannot get better. This condition may also be referred to as end-stage renal disease or ESRD. The kidneys are two organs that do many important jobs in the body, including:  Removing wastes and extra fluids from the blood.  Making hormones that maintain the amount of fluid in your tissues and blood vessels.  Maintaining the right amount of fluids and chemicals in the body. Without functioning kidneys, toxins build up in the blood and life-threatening complications can occur. What are the causes? This condition usually occurs when a long-term (chronic) kidney disease gets worse and results in permanent damage to the kidneys. It may also be caused by sudden damage to the kidneys (acute kidney injury). Causes of this condition include:  Having a family history of chronic kidney disease (CKD).  Having chronic kidney disease for many years.  Chronic medical conditions that affect the kidneys, such as: ? Cardiovascular disease, including high blood pressure. ? Diabetes. ? Certain diseases that affect the body's disease-fighting (immune) system.  Overuse of over-the-counter pain medicines.  Being around or being in contact with poisonous (toxic) substances. What increases the risk? The following factors may make you more likely to develop this condition:  Being older than 60.  Being male.  Being of African-American, Asian, Native American, [place], or Hispanic descent.  Smoking or a history of smoking.  Obesity. What are the signs or symptoms? Symptoms of this condition include:  Swelling (edema) of the face, legs, ankles, or feet.  Numbness, tingling, or loss of feeling in the hands or feet.  Tiredness (lethargy).  Nausea or vomiting.  Confusion, trouble concentrating, or loss of consciousness.  Chest pain.  Shortness of breath.  Passing  little or no urine.  Muscle twitches and cramps, especially in the legs.  Dry, itchy skin.  Loss of appetite.  Pale skin due to anemia, including the skin and tissue around the eye (conjunctiva).  Headaches.  Abnormally dark or light skin.  Decrease in muscle size (muscle wasting).  Easy bruising.  Frequent hiccups.  Stopping of the monthly period in women.  Jerky movements (seizures). How is this diagnosed? This condition may be diagnosed based on:  A physical exam, including blood pressure measurements.  Urine tests.  Blood tests.  Imaging tests.  A test in which a sample of tissue is removed from the kidneys to be examined under a microscope (kidney biopsy). How is this treated? This condition may be treated with:  A procedure that removes toxic wastes from the body (dialysis). There are two types of dialysis: ? Dialysis that is done through your abdomen (peritoneal dialysis). This may be done several times a day. ? Dialysis that is done by a machine (hemodialysis). This may be done several times a week.  Surgery to receive a new kidney (kidney transplant). In addition to having dialysis or a kidney transplant, you may need to take medicines:  To control high blood pressure (hypertension).  To control high cholesterol.  To treat diabetes.  To maintain healthy levels of minerals in the blood (electrolytes). You may also be given a specific meal plan to follow that includes requirements or limits for:  Salt (sodium).  Protein.  Phosphorous.  Potassium.  Calcium. Follow these instructions at home: Medicines  Take over-the-counter and prescription medicines only as told by your health care provider.  Do not take any new medicines, vitamins, or mineral supplements unless  approved by your health care provider. Many medicines and supplements can worsen kidney damage.  Follow instructions from your health care provider about adjusting the doses of any  medicines you take. Lifestyle  Do not use any products that contain nicotine or tobacco, such as cigarettes and e-cigarettes. If you need help quitting, ask your health care provider.  Achieve and maintain a healthy weight. If you need help with this, ask your health care provider.  Start or continue an exercise plan. Exercise at least 30 minutes a day, 5 days a week.  Follow your prescribed meal plan. General instructions  Stay current with your shots (immunizations) as told by your health care provider.  Keep track of your blood pressure. Report changes in your blood pressure as told by your health care provider.  If you are being treated for diabetes, monitor and track your blood sugar (blood glucose) levels as told by your health care provider.  Keep all follow-up visits as told by your health care provider. This is important. Where to find more information  American Association of Kidney Patients: www.aakp.org  National Kidney Foundation: www.kidney.org  American Kidney Fund: www.akfinc.org  [facility]: www.lifeoptions.org and www.kidneyschool.org Contact a health care provider if:  Your symptoms get worse.  You develop new symptoms. Get help right away if:  You have weakness in an arm or leg on one side of your body.  You have difficulty speaking or you are slurring your speech.  You have a sudden change in your vision.  You have a sudden, severe headache.  You have a sudden weight increase.  You have difficulty breathing.  Your symptoms suddenly get worse. Summary  End-stage kidney disease occurs when the kidneys are so damaged that they cannot function and cannot get better.  Without functioning kidneys, toxins build up in the blood and life-threatening complications can occur.  Treatment may include dialysis or a kidney transplant along with medicines and lifestyle changes. This information is not intended to replace advice  given to you by your health care provider. Make sure you discuss any questions you have with your health care provider. Document Released: 01/31/2004 Document Revised: 10/23/2017 Document Reviewed: 12/16/2016 Elsevier Patient Education  2020 [employer]  

## 2019-09-17 NOTE — Discharge Summary (Signed)
Name: Fred Harrison MRN: [id number] DOB: [birth year] 53 y.o. PCP: Ladell Pier, MD  Date of Admission: 09/13/2019  7:19 AM Date of Discharge: 09/17/2019 Attending Physician: Dr. Heber [place]. MD  Discharge Diagnosis: 1. Principal Problem: Acute on chronic renal failure (HCC) ESRD Active Problems: HTN (hypertension)  Discharge Medications: Allergies as of 09/17/2019   No Known Allergies     Medication List    TAKE these medications   amLODipine 10 MG tablet Commonly known as: NORVASC Take 1 tablet (10 mg total) by mouth daily.   aspirin 81 MG tablet Take 1 tablet (81 mg total) by mouth daily.   atorvastatin 20 MG tablet Commonly known as: LIPITOR Take 1 tablet (20 mg total) by mouth daily.   calcium carbonate 500 MG chewable tablet Commonly known as: TUMS - dosed in mg elemental calcium Chew 4 tablets (800 mg of elemental calcium total) by mouth 3 (three) times daily with meals.   hydrALAZINE 100 MG tablet Commonly known as: APRESOLINE Take 1 tablet (100 mg total) by mouth 3 (three) times daily. What changed:   medication strength  how much to take   isosorbide mononitrate 30 MG 24 hr tablet Commonly known as: IMDUR Take 1 tablet (30 mg total) by mouth daily.   labetalol 200 MG tablet Commonly known as: NORMODYNE Take 1 tablet (200 mg total) by mouth 2 (two) times daily.   losartan 50 MG tablet Commonly known as: COZAAR Take 1 tablet (50 mg total) by mouth daily.       Disposition and follow-up:   Fred Harrison was discharged from [facility] in Stable condition.  At the hospital follow up visit please address:  -Please make sure patient gets HD session MWF -Make sure he follows up with nephrology -Make sure he follow up with vascular surgery as scheduled for him -Please manage HTN and make sure his BP is controlled  2.  Labs / imaging needed at time of follow-up: Renal function, calcium  3.  Pending labs/ test needing  follow-up: none  Follow-up Appointments: Follow-up Information    Ladell Pier, MD. Call in 3 day(s).   Specialty: Internal Medicine Why: Call your PCP and make an appointment for early next week. Contact information: [place] [address] [phone]        Gaylene Brooks A, DO. Call.   Specialty: Nephrology Why: Call and make appointment for next week Contact information: [place] [address] [phone]        Elam Dutch, MD. Call in 5 day(s).   Specialties: Vascular Surgery, Cardiology Why: Call and ask for appointment for mid December (in 6 weeks from discharge) Contact information: [place] [address] [place] by problem list:   1.  ESRD: 53 year old male with poorly controlled HTN and advanced CKD5, presented with dizzyness ans presyncopal-like episode. He found to have generalized edema, congestion on CXR, and lab results significant with BUN 187, Cr of 22, K 4.7. HCO3: 13. He was admitted for new onset ESRD and for urgent HD per nephrology.  He received 3 HD sessions at hospital and his symptoms gradually improved.  He is status post tunneled dialysis catheter on 10/21 and left forearm AV fistula 10/24. Has a good thrill on the area at time of discharge. Arranged for outpatient HD on MWF.    2- Symptomatic hypocalcemia, metabolic bone disease Patient also had  hypopcalcemia of 6.3 (corrected 6.9) on arrival. He received IV and Po ca supplements per nephro. Muscle twitching significantly improved at discharge. Corrected calcium improved to ~8. Will continue Ca carbonate after discharge and to follow up with PCP within a week.  3.  Hypertension: Patient with poorly controlled HTN. His elevated BP was not improved with HD. Medications adjusted to labetalol 200 mg BID, amlodipine 5 mg QD and Hydralazin 100 mg BID yesterday. Losartan added to the regimen at the day of  discharge. Patient is asymptomatic at discharge. Discharging home with Po meds and to f/u with PCP.  4.  Anemia.  No indication for ESA's currently per nephrology   Discharge Vitals:   BP (!) 162/94 (BP Location: Right Arm)   Pulse 89   Temp 98.2 F (36.8 C) (Oral)   Resp 16   Ht 5' 6.75" (1.695 m)   Wt 190 lb 7.6 oz (86.4 kg)   SpO2 99%   BMI 30.06 kg/m   Pertinent Labs, Studies, and Procedures:  Component     Latest Ref Rng & Units 09/16/2019 09/17/2019           Sodium     135 - 145 mmol/L 137 135  Potassium     3.5 - 5.1 mmol/L 4.4 4.5  Chloride     98 - 111 mmol/L 100 97 (L)  CO2     22 - 32 mmol/L 23 22  Glucose     70 - 99 mg/dL 95 96  BUN     6 - 20 mg/dL 68 (H) 84 (H)  Creatinine     0.61 - 1.24 mg/dL 12.22 (H) 14.63 (H)  Calcium     8.9 - 10.3 mg/dL 7.5 (L) 7.1 (L)  Phosphorus     2.5 - 4.6 mg/dL 5.9 (H) 6.7 (H)  Albumin     3.5 - 5.0 g/dL 3.0 (L) 3.1 (L)  GFR, Est Non African American     >60 mL/min 4 (L) 3 (L)  GFR, Est African American     >60 mL/min 5 (L) 4 (L)  Anion gap     5 - 15 14 16  (H)    Component     Latest Ref Rng & Units 09/13/2019 09/13/2019         7:38 AM  2:30 PM  Sodium     135 - 145 mmol/L 140 138  Potassium     3.5 - 5.1 mmol/L 4.6 4.8  Chloride     98 - 111 mmol/L 103 101  CO2     22 - 32 mmol/L 13 (L) 15 (L)  Glucose     70 - 99 mg/dL 127 (H) 154 (H)  BUN     6 - 20 mg/dL 187 (H) 191 (H)  Creatinine     0.61 - 1.24 mg/dL 22.18 (H) 22.67 (H)  Calcium     8.9 - 10.3 mg/dL 6.3 (LL) 6.1 (LL)  Phosphorus     2.5 - 4.6 mg/dL  8.6 (H)  Albumin     3.5 - 5.0 g/dL 3.2 (L) 3.1 (L)  GFR, Est Non African American     >60 mL/min 2 (L) 2 (L)  GFR, Est African American     >60 mL/min 2 (L) 2 (L)  Anion gap     5 - 15 24 (H) 22 (H)   CXR 09/13/2019 FINDINGS: Mild cardiomegaly and vascular congestion. No confluent opacities or effusions. No acute bony abnormality.  Renal US  09/13/2019:  IMPRESSION: 1.  Echogenic kidneys, likely related to underlying medical renal disease. No hydronephrosis or shadowing stone. 2. Bilateral renal cysts.   US guide vas access: 09/14/2019 IMPRESSION: Successful placement of 23 cm tip to cuff tunneled hemodialysis catheter via the right internal jugular vein with tips terminating within the superior aspect of the right atrium. The catheter is ready for immediate use.  Echo 09/14/2019 IMPRESSIONS  1. Left ventricular ejection fraction, by visual estimation, is 55 to 60%. The left ventricle has normal function. Normal left ventricular size. There is borderline left ventricular hypertrophy.  2. Global right ventricle has normal systolic function.The right ventricular size is normal. No increase in right ventricular wall thickness.  3. Left atrial size was moderately dilated.  4. Right atrial size was moderately dilated.  5. The mitral valve is normal in structure. Trace mitral valve regurgitation. No evidence of mitral stenosis.  6. The tricuspid valve is normal in structure. Tricuspid valve regurgitation is trivial.  7. The aortic valve is tricuspid Aortic valve regurgitation was not visualized by color flow Doppler. Mild aortic valve sclerosis without stenosis.  8. The pulmonic valve was grossly normal. Pulmonic valve regurgitation is not visualized by color flow Doppler.  9. The inferior vena cava is dilated in size with >50% respiratory variability, suggesting right atrial pressure of 8 mmHg.  Discharge Instructions: Discharge Instructions    Call MD for:  persistant dizziness or light-headedness   Complete by: As directed    Call MD for:  persistant nausea and vomiting   Complete by: As directed    Diet - low sodium heart healthy   Complete by: As directed    Discharge instructions   Complete by: As directed    Thank you for allowing [place] taking care of you at [facility].  We are glad that you feel better. As we discussed, you were  admitted due to worsening of renal failure and required hemodialysis.  You will need to continue hemodialysis session at outpatient center as explained to you Monday, Wednesday, Friday.   It is very important to have your blood pressure under control. Your calcium level was also low and we gave you IV and oral calcium.  You will need to continue calcium tablet after discharge. Please be aware of medication changes and follow the instructions: -Take labetalol 200 mg twice a day (every 12 hours) -Take hydralazine 100 mg 3 times daily (every 8 hours) -Start taking losartan 50 mg daily -Continue taking amlodipine 10 mg daily -Take calcium supplement, 4 tablets, 3 times a day -Talk to your primary care doctor next week before starting Imdur -Take rest of your medications as before and as instructed.  Make sure to follow-up with your primary care doctor next week. Follow-up with nephrology. Follow-up with vascular surgery in 6 weeks.   Increase activity slowly   Complete by: As directed       Signed: Dewayne Hatch, MD 09/17/2019, 3:39 PM   [id number]

## 2019-09-17 NOTE — Procedures (Signed)
I evaluated the patient while on hemodialysis.  I reviewed the patient's treatment plan.  Patient tolerating dialysis well.  Discussed with RN.  

## 2019-09-18 ENCOUNTER — Telehealth: Payer: Self-pay | Admitting: Nephrology

## 2019-09-18 NOTE — Telephone Encounter (Signed)
Transition of care contact from inpatient facility  Date of Discharge: 09/17/19 Date of Contact:09/18/19 Method of contact: phone Talked with: patient  Patient contact to discuss transition of care from recent inpatient hospitalization and new start on dialysis . Pateint was admitted to [facility] from 10/20- 09/17/2019 with diagnosis of new ESRD (Cr 22 at start with metabolic acidosis), symptomatic hypocalcemia and poorly controlled HTN.    Medications were reviewed. Advised to bring all pill bottles to dialysis and that provider their could renew any dialysis related meds. He had not yet purchased the tums - reinforced need to get them and why to prevent previous symptoms of hypocalcemia he had at the time of admission.  Patient will follow up at outpatient dialysis Monday 10/27  Other follow up needs: None.   Amalia Hailey, PA-C [facility] Pager:  [phone]

## 2019-09-19 DIAGNOSIS — N186 End stage renal disease: Secondary | ICD-10-CM | POA: Diagnosis not present

## 2019-09-19 MED FILL — LABETALOL HCL 200 MG TABLET: 200 | 30 days supply | Qty: 60 | Fill #0

## 2019-09-19 MED FILL — ISOSORBIDE MN ER 30 MG TAB: 30 | 30 days supply | Qty: 30 | Fill #0

## 2019-09-19 MED FILL — LOSARTAN POTASSIUM 50 MG TA: 50 | 30 days supply | Qty: 30 | Fill #0

## 2019-09-19 MED FILL — hydrALAZINE HCL 100 MG TABS: 100 | 30 days supply | Qty: 90 | Fill #0

## 2019-09-20 ENCOUNTER — Telehealth: Payer: Self-pay

## 2019-09-20 ENCOUNTER — Other Ambulatory Visit: Payer: Self-pay | Admitting: Internal Medicine

## 2019-09-20 DIAGNOSIS — I10 Essential (primary) hypertension: Secondary | ICD-10-CM

## 2019-09-20 MED FILL — ?ATORVASTATIN 20 MG TABLET: 20 | 30 days supply | Qty: 30 | Fill #3

## 2019-09-20 MED FILL — ?AMLODIPINE BESYLATE 10 MG: 10 | 30 days supply | Qty: 30 | Fill #0

## 2019-09-20 NOTE — Telephone Encounter (Signed)
Transition Care Management Follow-up Telephone Call Date of discharge and from where:09/17/2019, [facility].  Call placed to # [phone], message left with call back requested to this CM # [phone]

## 2019-09-21 ENCOUNTER — Telehealth: Payer: Self-pay

## 2019-09-21 DIAGNOSIS — N186 End stage renal disease: Secondary | ICD-10-CM | POA: Diagnosis not present

## 2019-09-21 NOTE — Telephone Encounter (Signed)
From the discharge call. Patient has appt with Dr Wynetta Emery 10/06/2019.    He was concerned about his BP dropping too low.  He said that he has been taking his BP every 2 hours as it was done in the hospital and he is keeping a log. He reported that yesterday, a non HD day, his BP was 120/86 prior to taking his medication. He then took BP after taking medication and it was 100/65. Another time in the day it was 116/69.  He said the he did not feel dizzy/lightheaded when BP was lower but he felt sleepy.  Informed him that Dr Wynetta Emery would be notified of his BP and also instructed him to share his BP results with HD.  As per his AVS he is to check his BP and discuss with PCP prior to re-starting Imdur.    He was also concerned about facial twitching stating that it is like " tourette's" he had experienced this during hospitalization.       He said that he only has medication for 2 more days. He noted that he had not been taking his medications regularly prior to his hospitalization because he could not afford them.  He was rationing the pills that he had. He explained that he has a discount card with his insurance and he was informed by [facility] pharmacy that he is not eligible for any medication assistance because he has insurance. He said that he now has money and will pick up his medications. He said that they have been called into the pharmacy.  This CM confirmed with [facility] that patient has 6 medications ready for pick up. He said that he has no questions about the medications. The concern was regarding his BP as noted above. He said that he is aware of the change with hydralazine noted on the AVS and stated the correct order. He is also aware of the order for 2 new medications - losartan and calcium carbonate.

## 2019-09-21 NOTE — Telephone Encounter (Signed)
Transition Care Management Follow-up Telephone Call  Date of discharge and from where: 09/17/2019, [facility]   How have you been since you were released from the hospital? He was on his way to HD and stated that he feels ' a lot better" than when  he was admitted to the hospital   Any questions or concerns? He was concerned about his BP dropping too low.  He said that he has been taking his BP every 2 hours as it was done in the hospital and he is keeping a log. He reported that yesterday, a non HD day, his BP was 120/86 prior to taking his medication. He then took BP after taking medication and it was 100/65. Another time in the day it was 116/69.  He said the he did not feel dizzy/lightheaded when BP was lower but he felt sleepy.  Informed him that Dr Wynetta Emery would be notified of his BP and also instructed him to share his BP results with HD.  As per his AVS he is to check his BP and discuss with PCP prior to re-starting Imdur.   He was also concerned about facial twitching stating that it is like " tourette's" he had experienced this during hospitalization.   Items Reviewed:  Did the pt receive and understand the discharge instructions provided? Yes, he said he has the instructions and understands them. No questions.    Mediations obtained? He said that he only has medication for 2 more days. He noted that he had not been taking his medications regularly prior to his hospitalization because he could not afford them.  He was rationing the pills that he had. He explained that he has a discount card with his insurance and he was informed by [facility] pharmacy that he is not eligible for any medication assistance because he has insurance. He said that he now has money and will pick up his medications. He said that they have been called into the pharmacy.  This CM confirmed with [facility] that patient has 6 medications ready for pick up. He said that he has no questions about the medications. The  concern was regarding his BP as noted above. He said that he is aware of the change with hydralazine noted on the AVS and stated the correct order.    Any new allergies since your discharge? None reported   Do you have support at home? Yes, lives with daughter and son in law.   Other (ie: DME, Home Health, etc)no home health ordered.   Has BP monitor  Attends HD M/W/F at [facility].   Functional Questionnaire: (I = Independent and D = Dependent) ADL's: independent   Follow up appointments reviewed:    PCP Hospital f/u appt confirmed? 10/06/2019 @ 1050 with [facility]u [facility]t confirmed? Has appt with vascular scheduled  Are transportation arrangements needed? No, he has someone who can drive him to appts and also drives him to dialysis  If their condition worsens, is the pt aware to call  their PCP or go to the ED? yes  Was the patient provided with contact information for the PCP's office or ED? He has the clinic phone number  Was the pt encouraged to call back with questions or concerns? Yes

## 2019-09-23 DIAGNOSIS — N186 End stage renal disease: Secondary | ICD-10-CM | POA: Diagnosis not present

## 2019-09-23 NOTE — Telephone Encounter (Signed)
Have informed pharmacy of mailing preference.

## 2019-09-24 DIAGNOSIS — Z992 Dependence on renal dialysis: Secondary | ICD-10-CM | POA: Diagnosis not present

## 2019-09-24 DIAGNOSIS — N186 End stage renal disease: Secondary | ICD-10-CM | POA: Diagnosis not present

## 2019-09-26 DIAGNOSIS — N186 End stage renal disease: Secondary | ICD-10-CM | POA: Diagnosis not present

## 2019-09-28 DIAGNOSIS — N186 End stage renal disease: Secondary | ICD-10-CM | POA: Diagnosis not present

## 2019-09-30 DIAGNOSIS — N186 End stage renal disease: Secondary | ICD-10-CM | POA: Diagnosis not present

## 2019-10-03 DIAGNOSIS — N186 End stage renal disease: Secondary | ICD-10-CM | POA: Diagnosis not present

## 2019-10-05 DIAGNOSIS — N186 End stage renal disease: Secondary | ICD-10-CM | POA: Diagnosis not present

## 2019-10-06 ENCOUNTER — Other Ambulatory Visit: Payer: Self-pay

## 2019-10-06 ENCOUNTER — Encounter: Payer: Self-pay | Admitting: Internal Medicine

## 2019-10-06 ENCOUNTER — Ambulatory Visit: Payer: No Typology Code available for payment source | Attending: Internal Medicine | Admitting: Pharmacist

## 2019-10-06 ENCOUNTER — Ambulatory Visit: Payer: No Typology Code available for payment source | Attending: Internal Medicine | Admitting: Internal Medicine

## 2019-10-06 ENCOUNTER — Encounter: Payer: Self-pay | Admitting: Gastroenterology

## 2019-10-06 VITALS — BP 113/75 | HR 80 | Temp 99.1°F | Resp 16 | Ht 66.0 in | Wt 172.8 lb

## 2019-10-06 DIAGNOSIS — N186 End stage renal disease: Secondary | ICD-10-CM | POA: Insufficient documentation

## 2019-10-06 DIAGNOSIS — D509 Iron deficiency anemia, unspecified: Secondary | ICD-10-CM | POA: Diagnosis not present

## 2019-10-06 DIAGNOSIS — I10 Essential (primary) hypertension: Secondary | ICD-10-CM | POA: Diagnosis not present

## 2019-10-06 DIAGNOSIS — Z23 Encounter for immunization: Secondary | ICD-10-CM

## 2019-10-06 DIAGNOSIS — Z1211 Encounter for screening for malignant neoplasm of colon: Secondary | ICD-10-CM

## 2019-10-06 DIAGNOSIS — Z992 Dependence on renal dialysis: Secondary | ICD-10-CM

## 2019-10-06 NOTE — Addendum Note (Signed)
Addended by: Karle Plumber B on: 10/06/2019 01:12 PM   Modules accepted: Orders

## 2019-10-06 NOTE — Progress Notes (Signed)
Patient ID: Fred Harrison, male    DOB: Jun 08, 1966  MRN: [id number]  CC: Transition of care management Hospitalized: 10/20-24/2020 Date of call from case worker: 09/21/2019  Subjective: Fred Harrison is a 53 y.o. male who presents for hosp f/u/transition of care His concerns today include:  Pt with hx of HTN, TIA, CKDstage 5, former tob.  Patient was hospitalized 10/20-20 02/2019 with acute on chronic renal failure and end-stage renal disease, poorly controlled hypertension and symptomatic hypocalcemia.  BUN was 187, creatinine 22 and calcium 6.3.  He had pulmonary edema on his chest x-ray.  Echo revealed EF of 55 to 60% with normal left ventricle size and function.  There was borderline left ventricular hypertrophy.  Right ventricular delete that right ventricle was normal systolic function.  Patient was dialyzed 3 times while in the hospital.  Tunneled catheter placed in the right upper chest.  Left forearm AV fistula placed 09/17/2019.  Calcium was corrected with IV and p.o. calcium.  Patient also found to have new microcytic anemia.  Iron studies consistent with anemia of chronic disease.  Nephrology felt ESA was not indicated.  He was discharged home with Tums.  Blood pressure medications on discharge were hydralazine, labetalol, Cozaar, isosorbide and amlodipine.  Since discharge, patient has been attending dialysis Monday Wednesday and Fridays.  He has not scheduled an appointment as yet for follow-up with the nephrologist Dr. Grayland Ormond but plans to do so.  He has a follow-up with the vascular surgeon next month for recheck on his fistula.  He reports compliance with taking his blood pressure medications.  However hydralazine was discontinued about a week ago by the nephrologist at hemodialysis because his blood pressure was consistently running low.  Patient tells me he was also told to discontinue taking the Tums but he continues to take at least 1 a day.  He is not sure whether he is  receiving ESA but states that he sometimes gets a shot during dialysis.  Denies any blood in the stools.  He is overdue for colon cancer screening. -Denies any chest pain, shortness of breath, lower extremity edema, headaches or dizziness. 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  Patient Active Problem List   Diagnosis Date Noted  . Acute on chronic renal failure ([place]) 09/13/2019  . Scalp mass 12/01/2017  . CKD stage 4 secondary to hypertension ([place]) 12/01/2017  . Elevated hemoglobin ([place]) 08/13/2016  . Hypokalemia   . TIA (transient ischemic attack) 08/03/2016  . HTN (hypertension) 08/03/2016     Current Outpatient Medications on File Prior to Visit  Medication Sig Dispense Refill  . amLODipine (NORVASC) 10 MG tablet TAKE 1 TABLET (10 MG TOTAL) BY MOUTH DAILY. 30 tablet 0  . aspirin 81 MG tablet Take 1 tablet (81 mg total) by mouth daily. 30 tablet 0  . atorvastatin (LIPITOR) 20 MG tablet Take 1 tablet (20 mg total) by mouth daily. 90 tablet 3  . calcium carbonate (TUMS - DOSED IN MG ELEMENTAL CALCIUM) 500 MG chewable tablet Chew 4 tablets (800 mg of elemental calcium total) by mouth 3 (three) times daily with meals. 370 tablet 0  . hydrALAZINE (APRESOLINE) 100 MG tablet Take 1 tablet (100 mg total) by mouth 3 (three) times daily. 90 tablet 0  . isosorbide mononitrate (IMDUR) 30 MG 24 hr tablet Take 1 tablet (30 mg total) by  mouth daily. 30 tablet 2  . labetalol (NORMODYNE) 200 MG tablet Take 1 tablet (200 mg total) by mouth 2 (two) times daily. 60 tablet 0  . losartan (COZAAR) 50 MG tablet Take 1 tablet (50 mg total) by mouth daily. 30 tablet 0   No current facility-administered medications on file prior to visit.     No Known Allergies  Social History   Socioeconomic History  . Marital status: Divorced    Spouse name: Not on file  .  Number of children: Not on file  . Years of education: Not on file  . Highest education level: Not on file  Occupational History  . Not on file  Social Needs  . Financial resource strain: Not on file  . Food insecurity    Worry: Not on file    Inability: Not on file  . Transportation needs    Medical: Not on file    Non-medical: Not on file  Tobacco Use  . Smoking status: Former Smoker    Types: Cigarettes    Quit date: 07/02/2016    Years since quitting: 3.2  . Smokeless tobacco: Never Used  Substance and Sexual Activity  . Alcohol use: No  . Drug use: No  . Sexual activity: Not on file  Lifestyle  . Physical activity    Days per week: Not on file    Minutes per session: Not on file  . Stress: Not on file  Relationships  . Social Herbalist on phone: Not on file    Gets together: Not on file    Attends religious service: Not on file    Active member of club or organization: Not on file    Attends meetings of clubs or organizations: Not on file    Relationship status: Not on file  . Intimate partner violence    Fear of current or ex partner: Not on file    Emotionally abused: Not on file    Physically abused: Not on file    Forced sexual activity: Not on file  Other Topics Concern  . Not on file  Social History Narrative  . Not on file    Family History  Problem Relation Age of Onset  . Stroke Mother   . Hypertension Mother   . Heart attack Father   . Cancer Sister     Past Surgical History:  Procedure Laterality Date  . AV FISTULA PLACEMENT Left 09/16/2019   Procedure: ARTERIOVENOUS (AV) FISTULA CREATION;  Surgeon: Elam Dutch, MD;  Location: [facility];  Service: Vascular;  Laterality: Left;  . IR FLUORO GUIDE CV LINE RIGHT  09/14/2019  . IR US GUIDE VASC ACCESS RIGHT  09/14/2019    ROS: Review of Systems Negative except as stated above  PHYSICAL EXAM: BP 113/75   Pulse 80   Temp 99.1 F (37.3 C) (Oral)   Resp 16   Ht 5\' 6"  (1.676 m)    Wt 172 lb 12.8 oz (78.4 kg)   SpO2 95%   BMI 27.89 kg/m   Physical Exam General appearance - alert, well appearing, middle-age male and in no distress Mental status - normal mood, behavior, speech, dress, motor activity, and thought processes Eyes - pupils equal and reactive, extraocular eye movements intact Neck - supple, no significant adenopathy Chest - clear to auscultation, no wheezes, rales or rhonchi, symmetric air entry Heart - normal rate, regular rhythm, normal S1, S2, no murmurs, rubs, clicks or gallops Extremities - peripheral  pulses normal, no pedal edema, no clubbing or cyanosis  CMP Latest Ref Rng & Units 09/17/2019 09/17/2019 09/16/2019  Glucose 70 - 99 mg/dL 159(H) 96 95  BUN 6 - 20 mg/dL 86(H) 84(H) 68(H)  Creatinine 0.61 - 1.24 mg/dL 14.53(H) 14.63(H) 12.22(H)  Sodium 135 - 145 mmol/L 135 135 137  Potassium 3.5 - 5.1 mmol/L 4.1 4.5 4.4  Chloride 98 - 111 mmol/L 97(L) 97(L) 100  CO2 22 - 32 mmol/L 22 22 23   Calcium 8.9 - 10.3 mg/dL 6.9(L) 7.1(L) 7.5(L)  Total Protein 6.5 - 8.1 g/dL - - -  Total Bilirubin 0.3 - 1.2 mg/dL - - -  Alkaline Phos 38 - 126 U/L - - -  AST 15 - 41 U/L - - -  ALT 0 - 44 U/L - - -   Lipid Panel     Component Value Date/Time   CHOL 163 09/06/2018 1213   TRIG 141 09/06/2018 1213   HDL 32 (L) 09/06/2018 1213   CHOLHDL 5.1 (H) 09/06/2018 1213   CHOLHDL 4.6 08/04/2016 0340   VLDL 34 08/04/2016 0340   LDLCALC 103 (H) 09/06/2018 1213    CBC    Component Value Date/Time   WBC 6.5 09/17/2019 0824   RBC 3.81 (L) 09/17/2019 0824   HGB 9.3 (L) 09/17/2019 0824   HGB 17.5 09/06/2018 1213   HCT 30.1 (L) 09/17/2019 0824   HCT 53.4 (H) 09/06/2018 1213   PLT 264 09/17/2019 0824   PLT 411 09/06/2018 1213   MCV 79.0 (L) 09/17/2019 0824   MCV 75 (L) 09/06/2018 1213   MCH 24.4 (L) 09/17/2019 0824   MCHC 30.9 09/17/2019 0824   RDW 15.0 09/17/2019 0824   RDW 17.0 (H) 09/06/2018 1213   LYMPHSABS 0.4 (L) 09/13/2019 0738   MONOABS 0.4  09/13/2019 0738   EOSABS 0.2 09/13/2019 0738   BASOSABS 0.1 09/13/2019 0738    ASSESSMENT AND PLAN: 1. Essential hypertension Blood pressure well controlled.  I have taken the hydralazine off med list as patient states he was told by the nephrologist to discontinue taking it.  Encourage low-salt diet. - Basic metabolic panel  2. ESRD on hemodialysis ([place]) Continue going to dialysis 3 days a week.  I will submit a referral for him to have a follow-up appointment with the nephrologist Dr. Manuela Schwartz  3. Need for influenza vaccination Given today  4. Microcytic anemia Iron studies more consistent with anemia of chronic disease - CBC  5. Screening for colon cancer Discussed colon cancer screening patient is agreeable to referral to gastroenterologist for colonoscopy - Ambulatory referral to Gastroenterology     Patient was given the opportunity to ask questions.  Patient verbalized understanding of the plan and was able to repeat key elements of the plan.   No orders of the defined types were placed in this encounter.    Requested Prescriptions    No prescriptions requested or ordered in this encounter    No follow-ups on file.  Karle Plumber, MD, FACP

## 2019-10-06 NOTE — Patient Instructions (Signed)
Influenza Virus Vaccine injection (Fluarix) What is this medicine? INFLUENZA VIRUS VACCINE (in floo EN zuh VAHY ruhs vak SEEN) helps to reduce the risk of getting influenza also known as the flu. This medicine may be used for other purposes; ask your health care provider or pharmacist if you have questions. COMMON BRAND NAME(S): Fluarix, Fluzone What should I tell my health care provider before I take this medicine? They need to know if you have any of these conditions:  bleeding disorder like hemophilia  fever or infection  Guillain-Barre syndrome or other neurological problems  immune system problems  infection with the human immunodeficiency virus (HIV) or AIDS  low blood platelet counts  multiple sclerosis  an unusual or allergic reaction to influenza virus vaccine, eggs, chicken proteins, latex, gentamicin, other medicines, foods, dyes or preservatives  pregnant or trying to get pregnant  breast-feeding How should I use this medicine? This vaccine is for injection into a muscle. It is given by a health care professional. A copy of Vaccine Information Statements will be given before each vaccination. Read this sheet carefully each time. The sheet may change frequently. Talk to your pediatrician regarding the use of this medicine in children. Special care may be needed. Overdosage: If you think you have taken too much of this medicine contact a poison control center or emergency room at once. NOTE: This medicine is only for you. Do not share this medicine with others. What if I miss a dose? This does not apply. What may interact with this medicine?  chemotherapy or radiation therapy  medicines that lower your immune system like etanercept, anakinra, infliximab, and adalimumab  medicines that treat or prevent blood clots like warfarin  phenytoin  steroid medicines like prednisone or cortisone  theophylline  vaccines This list may not describe all possible  interactions. Give your health care provider a list of all the medicines, herbs, non-prescription drugs, or dietary supplements you use. Also tell them if you smoke, drink alcohol, or use illegal drugs. Some items may interact with your medicine. What should I watch for while using this medicine? Report any side effects that do not go away within 3 days to your doctor or health care professional. Call your health care provider if any unusual symptoms occur within 6 weeks of receiving this vaccine. You may still catch the flu, but the illness is not usually as bad. You cannot get the flu from the vaccine. The vaccine will not protect against colds or other illnesses that may cause fever. The vaccine is needed every year. What side effects may I notice from receiving this medicine? Side effects that you should report to your doctor or health care professional as soon as possible:  allergic reactions like skin rash, itching or hives, swelling of the face, lips, or tongue Side effects that usually do not require medical attention (report to your doctor or health care professional if they continue or are bothersome):  fever  headache  muscle aches and pains  pain, tenderness, redness, or swelling at site where injected  weak or tired This list may not describe all possible side effects. Call your doctor for medical advice about side effects. You may report side effects to FDA at 1-800-FDA-1088. Where should I keep my medicine? This vaccine is only given in a clinic, pharmacy, doctor's office, or other health care setting and will not be stored at home. NOTE: This sheet is a summary. It may not cover all possible information. If you have questions   about this medicine, talk to your doctor, pharmacist, or health care provider.  2020 Elsevier/Gold Standard (2008-06-07 09:30:40)  

## 2019-10-06 NOTE — Progress Notes (Signed)
Patient presents for vaccination against influenza per orders of Dr. Johnson. Consent given. Counseling provided. No contraindications exists. Vaccine administered without incident.   

## 2019-10-07 DIAGNOSIS — N186 End stage renal disease: Secondary | ICD-10-CM | POA: Diagnosis not present

## 2019-10-07 LAB — BASIC METABOLIC PANEL
BUN/Creatinine Ratio: 3 — ABNORMAL LOW (ref 9–20)
BUN: 24 mg/dL (ref 6–24)
CO2: 26 mmol/L (ref 20–29)
Calcium: 9 mg/dL (ref 8.7–10.2)
Chloride: 97 mmol/L (ref 96–106)
Creatinine, Ser: 7.59 mg/dL — ABNORMAL HIGH (ref 0.76–1.27)
GFR calc Af Amer: 8 mL/min/{1.73_m2} — ABNORMAL LOW (ref >59)
GFR calc non Af Amer: 7 mL/min/{1.73_m2} — ABNORMAL LOW (ref >59)
Glucose: 85 mg/dL (ref 65–99)
Potassium: 3.8 mmol/L (ref 3.5–5.2)
Sodium: 141 mmol/L (ref 134–144)

## 2019-10-07 LAB — CBC
Hematocrit: 35.1 % — ABNORMAL LOW (ref 37.5–51.0)
Hemoglobin: 10.6 g/dL — ABNORMAL LOW (ref 13.0–17.7)
MCH: 24.4 pg — ABNORMAL LOW (ref 26.6–33.0)
MCHC: 30.2 g/dL — ABNORMAL LOW (ref 31.5–35.7)
MCV: 81 fL (ref 79–97)
Platelets: 332 10*3/uL (ref 150–450)
RBC: 4.34 x10E6/uL (ref 4.14–5.80)
RDW: 14.8 % (ref 11.6–15.4)
WBC: 5.7 10*3/uL (ref 3.4–10.8)

## 2019-10-10 DIAGNOSIS — N186 End stage renal disease: Secondary | ICD-10-CM | POA: Diagnosis not present

## 2019-10-12 ENCOUNTER — Other Ambulatory Visit: Payer: Self-pay | Admitting: Internal Medicine

## 2019-10-12 DIAGNOSIS — N186 End stage renal disease: Secondary | ICD-10-CM | POA: Diagnosis not present

## 2019-10-14 DIAGNOSIS — N186 End stage renal disease: Secondary | ICD-10-CM | POA: Diagnosis not present

## 2019-10-17 DIAGNOSIS — N186 End stage renal disease: Secondary | ICD-10-CM | POA: Diagnosis not present

## 2019-10-18 DIAGNOSIS — N186 End stage renal disease: Secondary | ICD-10-CM | POA: Diagnosis not present

## 2019-10-21 DIAGNOSIS — N186 End stage renal disease: Secondary | ICD-10-CM | POA: Diagnosis not present

## 2019-10-24 DIAGNOSIS — Z992 Dependence on renal dialysis: Secondary | ICD-10-CM | POA: Diagnosis not present

## 2019-10-24 DIAGNOSIS — N186 End stage renal disease: Secondary | ICD-10-CM | POA: Diagnosis not present

## 2019-10-26 DIAGNOSIS — N186 End stage renal disease: Secondary | ICD-10-CM | POA: Diagnosis not present

## 2019-10-28 DIAGNOSIS — N186 End stage renal disease: Secondary | ICD-10-CM | POA: Diagnosis not present

## 2019-10-31 DIAGNOSIS — N186 End stage renal disease: Secondary | ICD-10-CM | POA: Diagnosis not present

## 2019-11-01 ENCOUNTER — Telehealth: Payer: Self-pay | Admitting: *Deleted

## 2019-11-01 ENCOUNTER — Ambulatory Visit (AMBULATORY_SURGERY_CENTER): Payer: No Typology Code available for payment source | Admitting: *Deleted

## 2019-11-01 ENCOUNTER — Other Ambulatory Visit: Payer: Self-pay

## 2019-11-01 VITALS — Temp 96.3°F | Ht 66.0 in | Wt 173.8 lb

## 2019-11-01 DIAGNOSIS — Z1211 Encounter for screening for malignant neoplasm of colon: Secondary | ICD-10-CM

## 2019-11-01 DIAGNOSIS — Z1159 Encounter for screening for other viral diseases: Secondary | ICD-10-CM

## 2019-11-01 MED ORDER — NA SULFATE-K SULFATE-MG SULF 17.5-3.13-1.6 GM/177ML PO SOLN: ORAL | 0 refills | Status: DC

## 2019-11-01 MED FILL — SUPREP BOWEL PREP KIT: 17.5-3.13-1 | 1 days supply | Qty: 354 | Fill #0

## 2019-11-01 NOTE — Telephone Encounter (Signed)
Patient has cancelled his colonoscopy due to insurance not covering the procedure. IF he reschedules this it will need to be done at the hospital.

## 2019-11-01 NOTE — Telephone Encounter (Signed)
Robbin,  This pt was intubated on 09/16/2019 with a glidescope; consequently his procedure will need to be performed in the hospital.  Thanks,  Osvaldo Angst

## 2019-11-01 NOTE — Telephone Encounter (Signed)
John,  Please review last anesthesia record from 09/16/2019, pt was telling me that he had 2 teeth knocked out during that surgery. Please adivse. [facility] for [facility] colonoscopy? Thanks, Robbin pv

## 2019-11-01 NOTE — Progress Notes (Signed)
Patient is here in-person for PV. Patient denies any allergies to eggs or soy. Patient had complication with anesthesia-note sent to Osvaldo Angst, CRNA. Marland Kitchen Patient denies any oxygen use at home. Patient denies taking any diet/weight loss medications or blood thinners. Patient is not being treated for MRSA or C-diff. EMMI education assisgned to the patient for the procedure, this was explained and instructions given to patient. COVID-19 screening test is on 12/17, the pt is aware. Pt is aware that care partner will wait in the car during procedure; if they feel like they will be too hot or cold to wait in the car; they may wait in the 4 th floor lobby. Patient is aware to bring only one care partner. We want them to wear a mask (we do not have any that we can provide them), practice social distancing, and we will check their temperatures when they get here.  I did remind the patient that their care partner needs to stay in the parking lot the entire time and have a cell phone available, we will call them when the pt is ready for discharge. Patient will wear mask into building.    Suprep cost  $10 per pt's pharmacy pt is aware.

## 2019-11-02 ENCOUNTER — Other Ambulatory Visit: Payer: Self-pay

## 2019-11-02 DIAGNOSIS — N186 End stage renal disease: Secondary | ICD-10-CM

## 2019-11-02 DIAGNOSIS — Z992 Dependence on renal dialysis: Secondary | ICD-10-CM

## 2019-11-03 ENCOUNTER — Ambulatory Visit (HOSPITAL_COMMUNITY)
Admission: RE | Admit: 2019-11-03 | Discharge: 2019-11-03 | Disposition: A | Discharge status: Home / Self Care | Payer: No Typology Code available for payment source | Source: Ambulatory Visit | Attending: Vascular Surgery | Admitting: Vascular Surgery

## 2019-11-03 ENCOUNTER — Other Ambulatory Visit: Payer: Self-pay

## 2019-11-03 ENCOUNTER — Ambulatory Visit (INDEPENDENT_AMBULATORY_CARE_PROVIDER_SITE_OTHER): Payer: Self-pay | Admitting: Physician Assistant

## 2019-11-03 VITALS — BP 105/59 | HR 75 | Resp 16 | Ht 66.5 in | Wt 171.0 lb

## 2019-11-03 DIAGNOSIS — N186 End stage renal disease: Secondary | ICD-10-CM

## 2019-11-03 DIAGNOSIS — Z992 Dependence on renal dialysis: Secondary | ICD-10-CM | POA: Insufficient documentation

## 2019-11-03 NOTE — Progress Notes (Signed)
    Postoperative Access Visit   History of Present Illness   Fred Harrison is a 53 y.o. year old male who presents for postoperative follow-up for: left radiocephalic arteriovenous fistula by Dr. Oneida Alar 09/16/19.  The patient's wounds are healed.  The patient denies steal symptoms.  The patient is able to complete their activities of daily living.  He is dialyzing on a Monday Wednesday Friday schedule via right IJ TDC without complications.   Physical Examination   Vitals:   11/03/19 1320  BP: (!) 105/59  Pulse: 75  Resp: 16  SpO2: 98%  Weight: 171 lb (77.6 kg)  Height: 5' 6.5" (1.689 m)   Body mass index is 27.19 kg/m.  left arm Incision is healed, hand grip is 5/5, sensation in digits is  intact, palpable thrill, bruit can be auscultated     Medical Decision Making   Fred Harrison is a 53 y.o. year old male who presents s/p left radiocephalic arteriovenous fistula   Patent radiocephalic fistula without signs or symptoms of steal syndrome  Recheck fistula duplex in 4-5 weeks  Continue HD via R IJ TDC until fistula is re-evaluated   Fred Ligas PA-C [facility] Office: [phone]  Clinic MD: Oneida Alar

## 2019-11-04 DIAGNOSIS — N186 End stage renal disease: Secondary | ICD-10-CM | POA: Diagnosis not present

## 2019-11-07 DIAGNOSIS — N186 End stage renal disease: Secondary | ICD-10-CM | POA: Diagnosis not present

## 2019-11-09 DIAGNOSIS — N186 End stage renal disease: Secondary | ICD-10-CM | POA: Diagnosis not present

## 2019-11-11 DIAGNOSIS — N186 End stage renal disease: Secondary | ICD-10-CM | POA: Diagnosis not present

## 2019-11-14 DIAGNOSIS — N186 End stage renal disease: Secondary | ICD-10-CM | POA: Diagnosis not present

## 2019-11-15 ENCOUNTER — Encounter: Payer: No Typology Code available for payment source | Admitting: Gastroenterology

## 2019-11-16 DIAGNOSIS — N186 End stage renal disease: Secondary | ICD-10-CM | POA: Diagnosis not present

## 2019-11-19 DIAGNOSIS — N186 End stage renal disease: Secondary | ICD-10-CM | POA: Diagnosis not present

## 2019-11-21 ENCOUNTER — Other Ambulatory Visit: Payer: Self-pay | Admitting: Internal Medicine

## 2019-11-21 DIAGNOSIS — N186 End stage renal disease: Secondary | ICD-10-CM | POA: Diagnosis not present

## 2019-11-21 DIAGNOSIS — I10 Essential (primary) hypertension: Secondary | ICD-10-CM

## 2019-11-21 MED FILL — ?AMLODIPINE BESYLATE 10 MG: 10 | 30 days supply | Qty: 30 | Fill #0

## 2019-11-21 MED FILL — ?ATORVASTATIN 20 MG TABLET: 20 | 30 days supply | Qty: 30 | Fill #4

## 2019-11-21 MED FILL — ISOSORBIDE MN ER 30 MG TAB: 30 | 30 days supply | Qty: 30 | Fill #1

## 2019-11-22 ENCOUNTER — Other Ambulatory Visit: Payer: Self-pay | Admitting: *Deleted

## 2019-11-22 DIAGNOSIS — Z992 Dependence on renal dialysis: Secondary | ICD-10-CM

## 2019-11-22 DIAGNOSIS — N186 End stage renal disease: Secondary | ICD-10-CM

## 2019-11-23 DIAGNOSIS — N186 End stage renal disease: Secondary | ICD-10-CM | POA: Diagnosis not present

## 2019-11-24 DIAGNOSIS — Z992 Dependence on renal dialysis: Secondary | ICD-10-CM | POA: Diagnosis not present

## 2019-11-24 DIAGNOSIS — N186 End stage renal disease: Secondary | ICD-10-CM | POA: Diagnosis not present

## 2019-11-26 DIAGNOSIS — N186 End stage renal disease: Secondary | ICD-10-CM | POA: Diagnosis not present

## 2019-11-28 DIAGNOSIS — N186 End stage renal disease: Secondary | ICD-10-CM | POA: Diagnosis not present

## 2019-11-30 DIAGNOSIS — N186 End stage renal disease: Secondary | ICD-10-CM | POA: Diagnosis not present

## 2019-12-01 ENCOUNTER — Other Ambulatory Visit: Payer: Self-pay

## 2019-12-01 ENCOUNTER — Ambulatory Visit (HOSPITAL_COMMUNITY)
Admission: RE | Admit: 2019-12-01 | Discharge: 2019-12-01 | Disposition: A | Discharge status: Home / Self Care | Payer: Medicaid Other | Source: Ambulatory Visit | Attending: Family | Admitting: Family

## 2019-12-01 ENCOUNTER — Encounter: Payer: Self-pay | Admitting: Family

## 2019-12-01 ENCOUNTER — Ambulatory Visit (INDEPENDENT_AMBULATORY_CARE_PROVIDER_SITE_OTHER): Payer: Self-pay | Admitting: Family

## 2019-12-01 VITALS — BP 118/75 | HR 76 | Temp 97.2°F | Resp 16 | Ht 66.5 in | Wt 170.0 lb

## 2019-12-01 DIAGNOSIS — Z992 Dependence on renal dialysis: Secondary | ICD-10-CM

## 2019-12-01 DIAGNOSIS — N186 End stage renal disease: Secondary | ICD-10-CM

## 2019-12-01 DIAGNOSIS — I770 Arteriovenous fistula, acquired: Secondary | ICD-10-CM

## 2019-12-01 NOTE — Progress Notes (Signed)
CC: follow up duplex of AVF s/p creation of AVF in October 2020  History of Present Illness  Masanori Cavell is a 54 y.o. (16-May-1966) male who is s/p left radiocephalic arteriovenous fistula creation by Dr. Oneida Alar on 09/16/19.    The patient denies steal symptoms. He is dialyzing on a Monday Wednesday Friday schedule via right IJ TDC without complications.  He is right hand dominant.   This is his first permanent HD access.   He returns today for duplex of left arm AVF and evaluation.    Past Medical History:  Diagnosis Date  . Chronic kidney disease M-W-F   hemodialysis   . Hypertension   . TIA (transient ischemic attack) 2013,2015    Social History Social History   Tobacco Use  . Smoking status: Former Smoker    Types: Cigarettes    Quit date: 07/02/2016    Years since quitting: 3.4  . Smokeless tobacco: Never Used  Substance Use Topics  . Alcohol use: No  . Drug use: No    Family History Family History  Problem Relation Age of Onset  . Stroke Mother   . Hypertension Mother   . Heart attack Father   . Cancer Sister   . Colon cancer Neg Hx   . Colon polyps Neg Hx   . Esophageal cancer Neg Hx   . Stomach cancer Neg Hx   . Rectal cancer Neg Hx     Surgical History Past Surgical History:  Procedure Laterality Date  . AV FISTULA PLACEMENT Left 09/16/2019   Procedure: ARTERIOVENOUS (AV) FISTULA CREATION;  Surgeon: Elam Dutch, MD;  Location: [place];  Service: Vascular;  Laterality: Left;  . IR FLUORO GUIDE CV LINE RIGHT  09/14/2019  . IR US GUIDE VASC ACCESS RIGHT  09/14/2019    No Known Allergies  Current Outpatient Medications  Medication Sig Dispense Refill  . amLODipine (NORVASC) 10 MG tablet TAKE 1 TABLET (10 MG TOTAL) BY MOUTH DAILY. 30 tablet 0  . aspirin 81 MG tablet Take 1 tablet (81 mg total) by mouth daily. 30 tablet 0  . atorvastatin (LIPITOR) 20 MG tablet Take 1 tablet (20 mg total) by mouth daily. 90 tablet 3  . AURYXIA 1 GM 210  MG(Fe) tablet Take 1 tablet by mouth three times a day with meals 1 with meals and snacks up to 5 tablets a day    . isosorbide mononitrate (IMDUR) 30 MG 24 hr tablet Take 1 tablet (30 mg total) by mouth daily. 30 tablet 2  . labetalol (NORMODYNE) 100 MG tablet Take 100 mg by mouth 2 (two) times daily.    Marland Kitchen losartan (COZAAR) 50 MG tablet Take 1 tablet (50 mg total) by mouth daily. 30 tablet 0  . Na Sulfate-K Sulfate-Mg Sulf 17.5-3.13-1.6 GM/177ML SOLN Suprep (no substitutions)-TAKE AS DIRECTED. 354 mL 0   No current facility-administered medications for this visit.     REVIEW OF SYSTEMS: see HPI for pertinent positives and negatives    PHYSICAL EXAMINATION:  Vitals:   12/01/19 1030  BP: 118/75  Pulse: 76  Resp: 16  Temp: (!) 97.2 F (36.2 C)  TempSrc: Temporal  SpO2: 99%  Weight: 170 lb (77.1 kg)  Height: 5' 6.5" (1.689 m)   Body mass index is 27.03 kg/m.  General: The patient appears younger than his stated age, fit appearing, in NAD, A&O x 3.  HEENT:  No gross abnormalities Pulmonary: Respirations are non-labored, good air movement in all fields with no  rales, rhonchi, or wheezes.  Abdomen: Soft and non-tender with normal bowel sounds Musculoskeletal: There are no major deformities.   Neurologic: No focal weakness or paresthesias are detected, bilateral hand grip strength is 5/5.  Skin: There are no ulcer or rashes noted. Psychiatric: The patient has normal affect. Cardiovascular: There is a regular rate and rhythm without significant murmur appreciated.    Non-Invasive Vascular Imaging  Left lower arm Access Duplex  (Date: 12/01/2019):  Findings: +--------------------+----------+-----------------+--------+ AVF                 PSV (cm/s)Flow Vol (mL/min)Comments +--------------------+----------+-----------------+--------+ Native artery inflow   211           892                +--------------------+----------+-----------------+--------+ AVF Anastomosis         495                              +--------------------+----------+-----------------+--------+ +------------+----------+-------------+----------+-----------------------------+ OUTFLOW VEINPSV (cm/s)Diameter (cm)Depth (cm)          Describe            +------------+----------+-------------+----------+-----------------------------+ Prox Forearm    86        0.50        0.63                                 +------------+----------+-------------+----------+-----------------------------+ Mid Forearm     94        0.44        0.42    branch 0.329, 0.422, 0.347.                                                          0.47 cm            +------------+----------+-------------+----------+-----------------------------+ Dist Forearm   224        0.51        0.31                                 -------+----------+-------------+----------+-----------------------------+ Summary: 1. Patent left radio-cephalic AVF. 2. Multiple branches from the distal-mid to the mid-proximal forearm.   Medical Decision Making  Jarquez Mcknight is a 54 y.o. male who is s/p left radiocephalic arteriovenous fistula creation by Dr. Oneida Alar on 09/16/19.    He dialyzes via right IJ TDC on MWF.  He has no steal symptoms. There is a good thrill and bruit at the AVF.   Pt states he has not been squeezing an object in his left hand as he states no one advised him to do so. I advised him to squeeze a rubber ball or squeezable object in his left hand multiple times per day to facilitate increasing the diameter of the AVF.  I discussed with Dr. Scot Dock the small diameters and multiple side branches of the AVF on duplex today. Will schedule fistula gram.     Clemon Chambers, RN, MSN, FNP-C [facility] Office: [phone]  12/01/2019, 10:41 AM  Clinic MD: Laqueta Due

## 2019-12-01 NOTE — H&P (View-Only) (Signed)
CC: follow up duplex of AVF s/p creation of AVF in October 2020  History of Present Illness  Fred Harrison is a 54 y.o. (Jan 11, 1966) male who is s/p left radiocephalic arteriovenous fistula creation by Dr. Oneida Alar on 09/16/19.    The patient denies steal symptoms. He is dialyzing on a Monday Wednesday Friday schedule via right IJ TDC without complications.  He is right hand dominant.   This is his first permanent HD access.   He returns today for duplex of left arm AVF and evaluation.    Past Medical History:  Diagnosis Date  . Chronic kidney disease M-W-F   hemodialysis   . Hypertension   . TIA (transient ischemic attack) 2013,2015    Social History Social History   Tobacco Use  . Smoking status: Former Smoker    Types: Cigarettes    Quit date: 07/02/2016    Years since quitting: 3.4  . Smokeless tobacco: Never Used  Substance Use Topics  . Alcohol use: No  . Drug use: No    Family History Family History  Problem Relation Age of Onset  . Stroke Mother   . Hypertension Mother   . Heart attack Father   . Cancer Sister   . Colon cancer Neg Hx   . Colon polyps Neg Hx   . Esophageal cancer Neg Hx   . Stomach cancer Neg Hx   . Rectal cancer Neg Hx     Surgical History Past Surgical History:  Procedure Laterality Date  . AV FISTULA PLACEMENT Left 09/16/2019   Procedure: ARTERIOVENOUS (AV) FISTULA CREATION;  Surgeon: Elam Dutch, MD;  Location: [place];  Service: Vascular;  Laterality: Left;  . IR FLUORO GUIDE CV LINE RIGHT  09/14/2019  . IR US GUIDE VASC ACCESS RIGHT  09/14/2019    No Known Allergies  Current Outpatient Medications  Medication Sig Dispense Refill  . amLODipine (NORVASC) 10 MG tablet TAKE 1 TABLET (10 MG TOTAL) BY MOUTH DAILY. 30 tablet 0  . aspirin 81 MG tablet Take 1 tablet (81 mg total) by mouth daily. 30 tablet 0  . atorvastatin (LIPITOR) 20 MG tablet Take 1 tablet (20 mg total) by mouth daily. 90 tablet 3  . AURYXIA 1 GM 210  MG(Fe) tablet Take 1 tablet by mouth three times a day with meals 1 with meals and snacks up to 5 tablets a day    . isosorbide mononitrate (IMDUR) 30 MG 24 hr tablet Take 1 tablet (30 mg total) by mouth daily. 30 tablet 2  . labetalol (NORMODYNE) 100 MG tablet Take 100 mg by mouth 2 (two) times daily.    Marland Kitchen losartan (COZAAR) 50 MG tablet Take 1 tablet (50 mg total) by mouth daily. 30 tablet 0  . Na Sulfate-K Sulfate-Mg Sulf 17.5-3.13-1.6 GM/177ML SOLN Suprep (no substitutions)-TAKE AS DIRECTED. 354 mL 0   No current facility-administered medications for this visit.     REVIEW OF SYSTEMS: see HPI for pertinent positives and negatives    PHYSICAL EXAMINATION:  Vitals:   12/01/19 1030  BP: 118/75  Pulse: 76  Resp: 16  Temp: (!) 97.2 F (36.2 C)  TempSrc: Temporal  SpO2: 99%  Weight: 170 lb (77.1 kg)  Height: 5' 6.5" (1.689 m)   Body mass index is 27.03 kg/m.  General: The patient appears younger than his stated age, fit appearing, in NAD, A&O x 3.  HEENT:  No gross abnormalities Pulmonary: Respirations are non-labored, good air movement in all fields with no  rales, rhonchi, or wheezes.  Abdomen: Soft and non-tender with normal bowel sounds Musculoskeletal: There are no major deformities.   Neurologic: No focal weakness or paresthesias are detected, bilateral hand grip strength is 5/5.  Skin: There are no ulcer or rashes noted. Psychiatric: The patient has normal affect. Cardiovascular: There is a regular rate and rhythm without significant murmur appreciated.    Non-Invasive Vascular Imaging  Left lower arm Access Duplex  (Date: 12/01/2019):  Findings: +--------------------+----------+-----------------+--------+ AVF                 PSV (cm/s)Flow Vol (mL/min)Comments +--------------------+----------+-----------------+--------+ Native artery inflow   211           892                +--------------------+----------+-----------------+--------+ AVF Anastomosis         495                              +--------------------+----------+-----------------+--------+ +------------+----------+-------------+----------+-----------------------------+ OUTFLOW VEINPSV (cm/s)Diameter (cm)Depth (cm)          Describe            +------------+----------+-------------+----------+-----------------------------+ Prox Forearm    86        0.50        0.63                                 +------------+----------+-------------+----------+-----------------------------+ Mid Forearm     94        0.44        0.42    branch 0.329, 0.422, 0.347.                                                          0.47 cm            +------------+----------+-------------+----------+-----------------------------+ Dist Forearm   224        0.51        0.31                                 -------+----------+-------------+----------+-----------------------------+ Summary: 1. Patent left radio-cephalic AVF. 2. Multiple branches from the distal-mid to the mid-proximal forearm.   Medical Decision Making  Fred Harrison is a 54 y.o. male who is s/p left radiocephalic arteriovenous fistula creation by Dr. Oneida Alar on 09/16/19.    He dialyzes via right IJ TDC on MWF.  He has no steal symptoms. There is a good thrill and bruit at the AVF.   Pt states he has not been squeezing an object in his left hand as he states no one advised him to do so. I advised him to squeeze a rubber ball or squeezable object in his left hand multiple times per day to facilitate increasing the diameter of the AVF.  I discussed with Dr. Scot Dock the small diameters and multiple side branches of the AVF on duplex today. Will schedule fistula gram.     Clemon Chambers, RN, MSN, FNP-C [facility] [place] Office: [phone]  12/01/2019, 10:41 AM  Clinic MD: Laqueta Due

## 2019-12-02 DIAGNOSIS — N186 End stage renal disease: Secondary | ICD-10-CM | POA: Diagnosis not present

## 2019-12-05 DIAGNOSIS — N186 End stage renal disease: Secondary | ICD-10-CM | POA: Diagnosis not present

## 2019-12-07 DIAGNOSIS — N186 End stage renal disease: Secondary | ICD-10-CM | POA: Diagnosis not present

## 2019-12-08 ENCOUNTER — Inpatient Hospital Stay (HOSPITAL_COMMUNITY): Admission: RE | Admit: 2019-12-08 | Payer: No Typology Code available for payment source | Source: Ambulatory Visit

## 2019-12-09 DIAGNOSIS — N186 End stage renal disease: Secondary | ICD-10-CM | POA: Diagnosis not present

## 2019-12-10 ENCOUNTER — Other Ambulatory Visit (HOSPITAL_COMMUNITY)
Admission: RE | Admit: 2019-12-10 | Discharge: 2019-12-10 | Disposition: A | Discharge status: Home / Self Care | Payer: Medicaid Other | Source: Ambulatory Visit | Attending: Surgery | Admitting: Surgery

## 2019-12-10 DIAGNOSIS — Z20822 Contact with and (suspected) exposure to covid-19: Secondary | ICD-10-CM | POA: Insufficient documentation

## 2019-12-10 DIAGNOSIS — Z01812 Encounter for preprocedural laboratory examination: Secondary | ICD-10-CM | POA: Insufficient documentation

## 2019-12-10 LAB — SARS CORONAVIRUS 2 (TAT 6-24 HRS): SARS Coronavirus 2: NEGATIVE

## 2019-12-12 DIAGNOSIS — N186 End stage renal disease: Secondary | ICD-10-CM | POA: Diagnosis not present

## 2019-12-13 ENCOUNTER — Other Ambulatory Visit: Payer: Self-pay

## 2019-12-13 ENCOUNTER — Encounter (HOSPITAL_COMMUNITY)
Admission: RE | Disposition: A | Discharge status: Home / Self Care | Payer: Self-pay | Source: Home / Self Care | Attending: Surgery

## 2019-12-13 ENCOUNTER — Ambulatory Visit (HOSPITAL_COMMUNITY)
Admission: RE | Admit: 2019-12-13 | Discharge: 2019-12-13 | Disposition: A | Discharge status: Home / Self Care | Payer: Medicaid Other | Attending: Surgery | Admitting: Surgery

## 2019-12-13 ENCOUNTER — Ambulatory Visit (HOSPITAL_COMMUNITY): Payer: Medicaid Other | Admitting: Certified Registered Nurse Anesthetist

## 2019-12-13 ENCOUNTER — Ambulatory Visit: Admit: 2019-12-13 | Payer: No Typology Code available for payment source | Admitting: Surgery

## 2019-12-13 ENCOUNTER — Encounter (HOSPITAL_COMMUNITY): Payer: Self-pay | Admitting: Surgery

## 2019-12-13 DIAGNOSIS — Z992 Dependence on renal dialysis: Secondary | ICD-10-CM | POA: Insufficient documentation

## 2019-12-13 DIAGNOSIS — Z8249 Family history of ischemic heart disease and other diseases of the circulatory system: Secondary | ICD-10-CM | POA: Diagnosis not present

## 2019-12-13 DIAGNOSIS — Z87891 Personal history of nicotine dependence: Secondary | ICD-10-CM | POA: Insufficient documentation

## 2019-12-13 DIAGNOSIS — T82898A Other specified complication of vascular prosthetic devices, implants and grafts, initial encounter: Secondary | ICD-10-CM

## 2019-12-13 DIAGNOSIS — Z7982 Long term (current) use of aspirin: Secondary | ICD-10-CM | POA: Insufficient documentation

## 2019-12-13 DIAGNOSIS — Z8673 Personal history of transient ischemic attack (TIA), and cerebral infarction without residual deficits: Secondary | ICD-10-CM | POA: Insufficient documentation

## 2019-12-13 DIAGNOSIS — N186 End stage renal disease: Secondary | ICD-10-CM | POA: Insufficient documentation

## 2019-12-13 DIAGNOSIS — Z79899 Other long term (current) drug therapy: Secondary | ICD-10-CM | POA: Insufficient documentation

## 2019-12-13 DIAGNOSIS — I120 Hypertensive chronic kidney disease with stage 5 chronic kidney disease or end stage renal disease: Secondary | ICD-10-CM | POA: Insufficient documentation

## 2019-12-13 HISTORY — PX: A/V FISTULAGRAM: CATH118298

## 2019-12-13 HISTORY — PX: LIGATION OF COMPETING BRANCHES OF ARTERIOVENOUS FISTULA: SHX5949

## 2019-12-13 LAB — POCT I-STAT, CHEM 8
BUN: 43 mg/dL — ABNORMAL HIGH (ref 6–20)
Calcium, Ion: 1.05 mmol/L — ABNORMAL LOW (ref 1.15–1.40)
Chloride: 97 mmol/L — ABNORMAL LOW (ref 98–111)
Creatinine, Ser: 11.3 mg/dL — ABNORMAL HIGH (ref 0.61–1.24)
Glucose, Bld: 92 mg/dL (ref 70–99)
HCT: 45 % (ref 39.0–52.0)
Hemoglobin: 15.3 g/dL (ref 13.0–17.0)
Potassium: 4.4 mmol/L (ref 3.5–5.1)
Sodium: 140 mmol/L (ref 135–145)
TCO2: 33 mmol/L — ABNORMAL HIGH (ref 22–32)

## 2019-12-13 SURGERY — A/V FISTULAGRAM
Anesthesia: LOCAL

## 2019-12-13 SURGERY — LIGATION OF COMPETING BRANCHES OF ARTERIOVENOUS FISTULA
Anesthesia: Monitor Anesthesia Care | Site: Arm Lower | Laterality: Left

## 2019-12-13 SURGERY — ARTERIOVENOUS (AV) FISTULA CREATION
Anesthesia: Monitor Anesthesia Care | Laterality: Bilateral

## 2019-12-13 MED ORDER — PROPOFOL 500 MG/50ML IV EMUL
INTRAVENOUS | Status: DC | PRN
  Administered 2019-12-13: 25 ug/kg/min via INTRAVENOUS

## 2019-12-13 MED ORDER — 0.9 % SODIUM CHLORIDE (POUR BTL) OPTIME
TOPICAL | Status: DC | PRN
  Administered 2019-12-13: 1000 mL

## 2019-12-13 MED ORDER — THROMBIN (RECOMBINANT) 20000 UNITS EX SOLR
CUTANEOUS | Status: AC
  Filled 2019-12-13: qty 20000

## 2019-12-13 MED ORDER — SODIUM CHLORIDE 0.9 % IV SOLN
INTRAVENOUS | Status: DC | PRN
  Administered 2019-12-13: 09:00:00 1.5 g via INTRAVENOUS

## 2019-12-13 MED ORDER — SODIUM CHLORIDE 0.9 % IV SOLN: 250.0000 mL | INTRAVENOUS | Status: DC | PRN

## 2019-12-13 MED ORDER — LIDOCAINE-EPINEPHRINE (PF) 1 %-1:200000 IJ SOLN
INTRAMUSCULAR | Status: DC | PRN
  Administered 2019-12-13: 30 mL

## 2019-12-13 MED ORDER — FENTANYL CITRATE (PF) 250 MCG/5ML IJ SOLN
INTRAMUSCULAR | Status: AC
  Filled 2019-12-13: qty 5

## 2019-12-13 MED ORDER — SODIUM CHLORIDE 0.9 % IV SOLN: INTRAVENOUS | Status: DC

## 2019-12-13 MED ORDER — HEPARIN (PORCINE) IN NACL 1000-0.9 UT/500ML-% IV SOLN
INTRAVENOUS | Status: AC
  Filled 2019-12-13: qty 500

## 2019-12-13 MED ORDER — SODIUM CHLORIDE 0.9% FLUSH: 3.0000 mL | INTRAVENOUS | Status: DC | PRN

## 2019-12-13 MED ORDER — LIDOCAINE HCL (PF) 1 % IJ SOLN
INTRAMUSCULAR | Status: AC
  Filled 2019-12-13: qty 30

## 2019-12-13 MED ORDER — LIDOCAINE HCL (PF) 1 % IJ SOLN
INTRAMUSCULAR | Status: DC | PRN
  Administered 2019-12-13: 3 mL

## 2019-12-13 MED ORDER — ONDANSETRON HCL 4 MG/2ML IJ SOLN
INTRAMUSCULAR | Status: DC | PRN
  Administered 2019-12-13: 4 mg via INTRAVENOUS

## 2019-12-13 MED ORDER — HEPARIN (PORCINE) IN NACL 1000-0.9 UT/500ML-% IV SOLN
INTRAVENOUS | Status: DC | PRN
  Administered 2019-12-13: 500 mL

## 2019-12-13 MED ORDER — SODIUM CHLORIDE 0.9 % IV SOLN
INTRAVENOUS | Status: AC
  Filled 2019-12-13: qty 1.5

## 2019-12-13 MED ORDER — SODIUM CHLORIDE 0.9% FLUSH: 3.0000 mL | Freq: Two times a day (BID) | INTRAVENOUS | Status: DC

## 2019-12-13 MED ORDER — MIDAZOLAM HCL 2 MG/2ML IJ SOLN
INTRAMUSCULAR | Status: AC
  Filled 2019-12-13: qty 2

## 2019-12-13 MED ORDER — PROPOFOL 10 MG/ML IV BOLUS
INTRAVENOUS | Status: AC
  Filled 2019-12-13: qty 20

## 2019-12-13 MED ORDER — IODIXANOL 320 MG/ML IV SOLN
INTRAVENOUS | Status: DC | PRN
  Administered 2019-12-13: 25 mL

## 2019-12-13 MED ORDER — LIDOCAINE-EPINEPHRINE 0.5 %-1:200000 IJ SOLN
INTRAMUSCULAR | Status: AC
  Filled 2019-12-13: qty 1

## 2019-12-13 MED ORDER — OXYCODONE HCL 5 MG PO TABS: 5.0000 mg | ORAL_TABLET | ORAL | 0 refills | Status: DC | PRN

## 2019-12-13 SURGICAL SUPPLY — 33 items
ARMBAND PINK RESTRICT EXTREMIT (MISCELLANEOUS) ×3 IMPLANT
CANISTER SUCT 3000ML PPV (MISCELLANEOUS) ×3 IMPLANT
CLIP RETRACTION 3.0MM CORONARY (MISCELLANEOUS) ×2 IMPLANT
CLIP VESOCCLUDE MED 6/CT (CLIP) ×2 IMPLANT
CLIP VESOCCLUDE SM WIDE 6/CT (CLIP) ×2 IMPLANT
COVER WAND RF STERILE (DRAPES) ×3 IMPLANT
DERMABOND ADVANCED (GAUZE/BANDAGES/DRESSINGS) ×2
DERMABOND ADVANCED .7 DNX12 (GAUZE/BANDAGES/DRESSINGS) ×1 IMPLANT
ELECT REM PT RETURN 9FT ADLT (ELECTROSURGICAL) ×3
ELECTRODE REM PT RTRN 9FT ADLT (ELECTROSURGICAL) ×1 IMPLANT
GLOVE BIO SURGEON STRL SZ7.5 (GLOVE) ×5 IMPLANT
GLOVE BIOGEL PI IND STRL 6 (GLOVE) IMPLANT
GLOVE BIOGEL PI IND STRL 8 (GLOVE) ×1 IMPLANT
GLOVE BIOGEL PI INDICATOR 6 (GLOVE) ×2
GLOVE BIOGEL PI INDICATOR 8 (GLOVE) ×2
GLOVE INDICATOR 7.0 STRL GRN (GLOVE) ×2 IMPLANT
GOWN STRL REUS W/ TWL LRG LVL3 (GOWN DISPOSABLE) ×3 IMPLANT
GOWN STRL REUS W/TWL LRG LVL3 (GOWN DISPOSABLE) ×6
KIT BASIN OR (CUSTOM PROCEDURE TRAY) ×3 IMPLANT
KIT TURNOVER KIT B (KITS) ×3 IMPLANT
NS IRRIG 1000ML POUR BTL (IV SOLUTION) ×3 IMPLANT
PACK CV ACCESS (CUSTOM PROCEDURE TRAY) ×3 IMPLANT
PAD ARMBOARD 7.5X6 YLW CONV (MISCELLANEOUS) ×6 IMPLANT
SPONGE SURGIFOAM ABS GEL 100 (HEMOSTASIS) IMPLANT
SUT ETHILON 3 0 PS 1 (SUTURE) IMPLANT
SUT PROLENE 6 0 BV (SUTURE) IMPLANT
SUT SILK 0 TIES 10X30 (SUTURE) ×3 IMPLANT
SUT VIC AB 3-0 SH 27 (SUTURE) ×2
SUT VIC AB 3-0 SH 27X BRD (SUTURE) ×1 IMPLANT
SUT VICRYL 4-0 PS2 18IN ABS (SUTURE) ×3 IMPLANT
TOWEL GREEN STERILE (TOWEL DISPOSABLE) ×3 IMPLANT
UNDERPAD 30X30 (UNDERPADS AND DIAPERS) ×3 IMPLANT
WATER STERILE IRR 1000ML POUR (IV SOLUTION) ×3 IMPLANT

## 2019-12-13 SURGICAL SUPPLY — 8 items
COVER DOME SNAP 22 D (MISCELLANEOUS) ×2 IMPLANT
KIT MICROPUNCTURE NIT STIFF (SHEATH) ×1 IMPLANT
PROTECTION STATION PRESSURIZED (MISCELLANEOUS) ×2
SHEATH PROBE COVER 6X72 (BAG) ×1 IMPLANT
STATION PROTECTION PRESSURIZED (MISCELLANEOUS) ×1 IMPLANT
STOPCOCK MORSE 400PSI 3WAY (MISCELLANEOUS) ×2 IMPLANT
TRAY PV CATH (CUSTOM PROCEDURE TRAY) ×2 IMPLANT
TUBING CIL FLEX 10 FLL-RA (TUBING) ×2 IMPLANT

## 2019-12-13 NOTE — Interval H&P Note (Signed)
History and Physical Interval Note:  12/13/2019 7:29 AM  Fred Harrison  has presented today for surgery, with the diagnosis of instage renal.  The various methods of treatment have been discussed with the patient and family. After consideration of risks, benefits and other options for treatment, the patient has consented to  Procedure(s): A/V FISTULAGRAM - Left Arm (N/A) as a surgical intervention.  The patient's history has been reviewed, patient examined, no change in status, stable for surgery.  I have reviewed the patient's chart and labs.  Questions were answered to the patient's satisfaction.     Annamarie Major

## 2019-12-13 NOTE — Anesthesia Preprocedure Evaluation (Addendum)
Anesthesia Evaluation  Patient identified by MRN, date of birth, ID band Patient awake    Reviewed: Allergy & Precautions, NPO status , Patient's Chart, lab work & pertinent test results  Airway Mallampati: II  TM Distance: >3 FB Neck ROM: Full    Dental no notable dental hx. (+) Poor Dentition   Pulmonary former smoker,    Pulmonary exam normal breath sounds clear to auscultation       Cardiovascular hypertension, Pt. on medications and Pt. on home beta blockers Normal cardiovascular exam Rhythm:Regular Rate:Normal     Neuro/Psych TIAnegative psych ROS   GI/Hepatic negative GI ROS, Neg liver ROS,   Endo/Other  negative endocrine ROS  Renal/GU ESRF and DialysisRenal disease     Musculoskeletal negative musculoskeletal ROS (+)   Abdominal   Peds  Hematology negative hematology ROS (+)   Anesthesia Other Findings   Reproductive/Obstetrics                          Anesthesia Physical Anesthesia Plan  ASA: III  Anesthesia Plan: MAC   Post-op Pain Management:    Induction: Intravenous  PONV Risk Score and Plan: 2 and Ondansetron, Treatment may vary due to age or medical condition and Midazolam  Airway Management Planned: Natural Airway and Simple Face Mask  Additional Equipment: None  Intra-op Plan:   Post-operative Plan:   Informed Consent: I have reviewed the patients History and Physical, chart, labs and discussed the procedure including the risks, benefits and alternatives for the proposed anesthesia with the patient or authorized representative who has indicated his/her understanding and acceptance.       Plan Discussed with: CRNA  Anesthesia Plan Comments:      Anesthesia Quick Evaluation

## 2019-12-13 NOTE — Op Note (Signed)
    NAME: Fred Harrison    MRN: [id number] DOB: 03/19/1966    DATE OF OPERATION: 12/13/2019  PREOP DIAGNOSIS:    End-stage renal disease  POSTOP DIAGNOSIS:    Same  PROCEDURE:    Ligation of 2 competing branches of left radiocephalic AV fistula  SURGEON: Judeth Cornfield. Scot Dock, MD  ASSIST: None  ANESTHESIA: Local with sedation  EBL: Minimal  INDICATIONS:    [name] is a 54 y.o. male who had a left radiocephalic fistula placed on 09/16/2019.  This was not maturing adequately and underwent a fistulogram today which showed 2 large competing branches.  I was asked to ligate these branches.  FINDINGS:   Good thrill in the fistula at the completion of the procedure.  TECHNIQUE:   The patient was taken to the operating room and sedated by anesthesia.  The left arm was prepped and draped in usual sterile fashion.  Identified the 2 large branches by duplex and these were marked.  After the skin was anesthetized I made a transverse incision over 1 of these branches and clearly identified the branches.  It was divided between clips and 3-0 silk tie.  I then mobilized the vein proximally and distally.  Through the same incision I was able to identify the more central branch and get a clip on this without making a separate incision.  I preserve the main outflow.  Hemostasis was obtained in the wound.  The wound was closed with a 4-0 Vicryl.  Dermabond was applied.  The patient tolerated procedure well was transferred to the recovery room in stable condition.  All needle and sponge counts were correct.  Deitra Mayo, MD, FACS [facility]  DATE OF DICTATION:   12/13/2019

## 2019-12-13 NOTE — Interval H&P Note (Signed)
History and Physical Interval Note:  12/13/2019 8:41 AM  Fred Harrison  has presented today for surgery, with the diagnosis of Collateral.  The various methods of treatment have been discussed with the patient and family. After consideration of risks, benefits and other options for treatment, the patient has consented to  Procedure(s): BRANCH LIGATION OF LEFT ARTERIOVENOUS FISTULA (Left) as a surgical intervention.  The patient's history has been reviewed, patient examined, no change in status, stable for surgery.  I have reviewed the patient's chart and labs.  Questions were answered to the patient's satisfaction.     Deitra Mayo

## 2019-12-13 NOTE — Op Note (Addendum)
    Patient name: Fred Harrison MRN: [id number] DOB: 21-Sep-1966 Sex: male  12/13/2019 Pre-operative Diagnosis: Nonmaturing left radiocephalic fistula Post-operative diagnosis:  Same Surgeon:  Annamarie Major Procedure Performed:  1.  Ultrasound-guided access, left cephalic vein  2.  Fistulogram \   Indications: This is a 54 year old gentleman with end-stage renal disease who has a nonmaturing left radiocephalic fistula.  Duplex suggested multiple branches.  Procedure:  The patient was identified in the holding area and taken to room 8.  The patient was then placed supine on the table and prepped and draped in the usual sterile fashion.  A time out was called.ultrasound was used to evaluate the fistula.  The vein was patent and compressible.  A digital ultrasound image was acquired.  The fistula was then accessed under ultrasound guidance using a micropuncture needle.  An 018 wire was then asvanced without resistance and a micropuncture sheath was placed.  Contrast injections were then performed through the sheath.  Findings: The central venous system is widely patent.  The arterial venous anastomosis is widely patent.  The fistula is without stenosis and appears to be of adequate diameter.  There are several branches in the forearm   Intervention: None  Impression:  #1  Widely patent left radiocephalic fistula with no evidence of stenosis  #2  Multiple branches in the forearm which will be scheduled for ligation  #3  No evidence of central venous stenosis  #4  The arterial venous anastomosis is widely patent   V. Annamarie Major, M.D., [facility] [place] Office: [phone] Pager:  [phone]

## 2019-12-13 NOTE — Anesthesia Postprocedure Evaluation (Signed)
Anesthesia Post Note  Patient: Fred Harrison  Procedure(s) Performed: BRANCH LIGATION OF LEFT ARTERIOVENOUS FISTULA (Left Arm Lower)     Patient location during evaluation: PACU Anesthesia Type: MAC Level of consciousness: awake and alert Pain management: pain level controlled Vital Signs Assessment: post-procedure vital signs reviewed and stable Respiratory status: spontaneous breathing, nonlabored ventilation, respiratory function stable and patient connected to nasal cannula oxygen Cardiovascular status: stable and blood pressure returned to baseline Postop Assessment: no apparent nausea or vomiting Anesthetic complications: no    Last Vitals:  Vitals:   12/13/19 1020 12/13/19 1022  BP: (!) 171/87 122/77  Pulse: 69 72  Resp: 17 19  Temp:  36.8 C  SpO2: 100% 100%    Last Pain:  Vitals:   12/13/19 1022  TempSrc:   PainSc: 0-No pain                 Effie Berkshire

## 2019-12-13 NOTE — Transfer of Care (Signed)
Immediate Anesthesia Transfer of Care Note  Patient: Fred Harrison  Procedure(s) Performed: BRANCH LIGATION OF LEFT ARTERIOVENOUS FISTULA (Left Arm Lower)  Patient Location: PACU  Anesthesia Type:MAC  Level of Consciousness: awake, alert , oriented, patient cooperative and responds to stimulation  Airway & Oxygen Therapy: Patient Spontanous Breathing  Post-op Assessment: Report given to RN and Post -op Vital signs reviewed and stable  Post vital signs: Reviewed and stable  Last Vitals:  Vitals Value Taken Time  BP 138/70 12/13/19 1004  Temp    Pulse 77 12/13/19 1006  Resp 14 12/13/19 1006  SpO2 100 % 12/13/19 1006  Vitals shown include unvalidated device data.  Last Pain:  Vitals:   12/13/19 0806  TempSrc:   PainSc: 0-No pain      Patients Stated Pain Goal: 3 (89/78/47 8412)  Complications: No apparent anesthesia complications

## 2019-12-14 DIAGNOSIS — N186 End stage renal disease: Secondary | ICD-10-CM | POA: Diagnosis not present

## 2019-12-16 DIAGNOSIS — N186 End stage renal disease: Secondary | ICD-10-CM | POA: Diagnosis not present

## 2019-12-19 DIAGNOSIS — N186 End stage renal disease: Secondary | ICD-10-CM | POA: Diagnosis not present

## 2019-12-21 DIAGNOSIS — N186 End stage renal disease: Secondary | ICD-10-CM | POA: Diagnosis not present

## 2019-12-23 DIAGNOSIS — N186 End stage renal disease: Secondary | ICD-10-CM | POA: Diagnosis not present

## 2019-12-25 DIAGNOSIS — N186 End stage renal disease: Secondary | ICD-10-CM | POA: Diagnosis not present

## 2019-12-25 DIAGNOSIS — Z992 Dependence on renal dialysis: Secondary | ICD-10-CM | POA: Diagnosis not present

## 2019-12-26 DIAGNOSIS — N186 End stage renal disease: Secondary | ICD-10-CM | POA: Diagnosis not present

## 2019-12-28 DIAGNOSIS — N186 End stage renal disease: Secondary | ICD-10-CM | POA: Diagnosis not present

## 2019-12-30 DIAGNOSIS — N186 End stage renal disease: Secondary | ICD-10-CM | POA: Diagnosis not present

## 2020-01-02 DIAGNOSIS — N186 End stage renal disease: Secondary | ICD-10-CM | POA: Diagnosis not present

## 2020-01-04 DIAGNOSIS — N186 End stage renal disease: Secondary | ICD-10-CM | POA: Diagnosis not present

## 2020-01-04 MED FILL — LABETALOL HCL 100 MG TABS: 100 | 30 days supply | Qty: 60 | Fill #0

## 2020-01-06 DIAGNOSIS — N186 End stage renal disease: Secondary | ICD-10-CM | POA: Diagnosis not present

## 2020-01-09 DIAGNOSIS — N186 End stage renal disease: Secondary | ICD-10-CM | POA: Diagnosis not present

## 2020-01-11 DIAGNOSIS — N186 End stage renal disease: Secondary | ICD-10-CM | POA: Diagnosis not present

## 2020-01-13 DIAGNOSIS — N186 End stage renal disease: Secondary | ICD-10-CM | POA: Diagnosis not present

## 2020-01-16 DIAGNOSIS — N186 End stage renal disease: Secondary | ICD-10-CM | POA: Diagnosis not present

## 2020-01-18 DIAGNOSIS — N186 End stage renal disease: Secondary | ICD-10-CM | POA: Diagnosis not present

## 2020-01-20 DIAGNOSIS — N186 End stage renal disease: Secondary | ICD-10-CM | POA: Diagnosis not present

## 2020-01-22 DIAGNOSIS — Z992 Dependence on renal dialysis: Secondary | ICD-10-CM | POA: Diagnosis not present

## 2020-01-22 DIAGNOSIS — N186 End stage renal disease: Secondary | ICD-10-CM | POA: Diagnosis not present

## 2020-01-23 ENCOUNTER — Other Ambulatory Visit: Payer: Self-pay | Admitting: Internal Medicine

## 2020-01-23 DIAGNOSIS — I10 Essential (primary) hypertension: Secondary | ICD-10-CM

## 2020-01-23 MED FILL — ISOSORBIDE MN ER 30 MG TAB: 30 | 30 days supply | Qty: 30 | Fill #2

## 2020-01-23 MED FILL — ?ATORVASTATIN 20 MG TABLET: 20 | 30 days supply | Qty: 30 | Fill #5

## 2020-01-24 MED FILL — AMLODIPINE BESYLATE 10 MG T: 10 | 30 days supply | Qty: 30 | Fill #0

## 2020-02-09 ENCOUNTER — Other Ambulatory Visit: Payer: Self-pay

## 2020-02-09 ENCOUNTER — Ambulatory Visit: Payer: Medicaid Other | Attending: Internal Medicine | Admitting: Internal Medicine

## 2020-02-09 DIAGNOSIS — I10 Essential (primary) hypertension: Secondary | ICD-10-CM | POA: Diagnosis not present

## 2020-02-09 DIAGNOSIS — Z992 Dependence on renal dialysis: Secondary | ICD-10-CM

## 2020-02-09 DIAGNOSIS — R220 Localized swelling, mass and lump, head: Secondary | ICD-10-CM | POA: Diagnosis not present

## 2020-02-09 DIAGNOSIS — Z1211 Encounter for screening for malignant neoplasm of colon: Secondary | ICD-10-CM

## 2020-02-09 DIAGNOSIS — N186 End stage renal disease: Secondary | ICD-10-CM

## 2020-02-09 NOTE — Progress Notes (Signed)
Virtual Visit via Telephone Note Due to current restrictions/limitations of in-office visits due to the COVID-19 pandemic, this scheduled clinical appointment was converted to a telehealth visit  I connected with Fred Harrison on 02/09/20 at  10:02 a.m by telephone and verified that I am speaking with the correct person using two identifiers. I am in my office.  The patient is at home.  Only the patient and myself participated in this encounter.  I discussed the limitations, risks, security and privacy concerns of performing an evaluation and management service by telephone and the availability of in person appointments. I also discussed with the patient that there may be a patient responsible charge related to this service. The patient expressed understanding and agreed to proceed.   History of Present Illness: Pt with hx of HTN, TIA, CKDstage 5, former tob.  Purpose of today's visit is chronic disease management.  He was last seen 10/2019.  HTN/ESRD on HD: losartan d/c by nephrology.  On Norvasc and Labetalol Reports BP has been good.   Still going to HD 3 days a wk Doing well with eating habits.  He keeps log of liquid intake. He limits salt in the foods  Request referral to derm to have lesion on scalp removed.  We had referred 04/2019 but did not have reliable insurance at that time.   HM: Referred for colonoscopy for colon cancer screening.  Patient states he was called by [facility] and told that his insurance does not cover for colonoscopy. Outpatient Encounter Medications as of 02/09/2020  Medication Sig  . amLODipine (NORVASC) 10 MG tablet TAKE 1 TABLET (10 MG TOTAL) BY MOUTH DAILY.  Marland Kitchen aspirin 81 MG tablet Take 1 tablet (81 mg total) by mouth daily.  Marland Kitchen atorvastatin (LIPITOR) 20 MG tablet Take 1 tablet (20 mg total) by mouth daily.  Lorin Picket 1 GM 210 MG(Fe) tablet Take 210 mg by mouth 3 (three) times daily with meals.   . isosorbide mononitrate (IMDUR) 30 MG 24 hr tablet Take 1 tablet  (30 mg total) by mouth daily.  Marland Kitchen labetalol (NORMODYNE) 100 MG tablet Take 100 mg by mouth 2 (two) times daily.  . Multiple Vitamin (DAILY VITE) TABS Take 1 tablet by mouth daily.  Marland Kitchen losartan (COZAAR) 50 MG tablet Take 1 tablet (50 mg total) by mouth daily. (Patient not taking: Reported on 12/12/2019)  . Na Sulfate-K Sulfate-Mg Sulf 17.5-3.13-1.6 GM/177ML SOLN Suprep (no substitutions)-TAKE AS DIRECTED. (Patient not taking: Reported on 02/09/2020)  . oxyCODONE (ROXICODONE) 5 MG immediate release tablet Take 1 tablet (5 mg total) by mouth every 4 (four) hours as needed. (Patient not taking: Reported on 02/09/2020)   No facility-administered encounter medications on file as of 02/09/2020.    Observations/Objective: Results for orders placed or performed during the hospital encounter of 12/13/19  I-STAT, chem 8  Result Value Ref Range   Sodium 140 135 - 145 mmol/L   Potassium 4.4 3.5 - 5.1 mmol/L   Chloride 97 (L) 98 - 111 mmol/L   BUN 43 (H) 6 - 20 mg/dL   Creatinine, Ser 11.30 (H) 0.61 - 1.24 mg/dL   Glucose, Bld 92 70 - 99 mg/dL   Calcium, Ion 1.05 (L) 1.15 - 1.40 mmol/L   TCO2 33 (H) 22 - 32 mmol/L   Hemoglobin 15.3 13.0 - 17.0 g/dL   HCT 45.0 39.0 - 52.0 %     Assessment and Plan: 1. Essential hypertension Continue labetalol and Norvasc.  2. ESRD on hemodialysis ([place]) Followed by  nephrology and is compliant with going to dialysis 3 times a week.  3. Scalp mass - Ambulatory referral to Dermatology  4. Colon cancer screening Patient agreeable to doing Cologuard test instead of colonoscopy. - Cologuard   Follow Up Instructions: 4 mths   I discussed the assessment and treatment plan with the patient. The patient was provided an opportunity to ask questions and all were answered. The patient agreed with the plan and demonstrated an understanding of the instructions.   The patient was advised to call back or seek an in-person evaluation if the symptoms worsen or if the condition  fails to improve as anticipated.  I provided 11 minutes of non-face-to-face time during this encounter.   Karle Plumber, MD

## 2020-02-22 DIAGNOSIS — N186 End stage renal disease: Secondary | ICD-10-CM | POA: Diagnosis not present

## 2020-02-22 DIAGNOSIS — Z992 Dependence on renal dialysis: Secondary | ICD-10-CM | POA: Diagnosis not present

## 2020-02-24 LAB — COLOGUARD: Cologuard: NEGATIVE

## 2020-02-29 ENCOUNTER — Other Ambulatory Visit: Payer: Self-pay | Admitting: *Deleted

## 2020-02-29 DIAGNOSIS — N186 End stage renal disease: Secondary | ICD-10-CM

## 2020-02-29 DIAGNOSIS — Z992 Dependence on renal dialysis: Secondary | ICD-10-CM

## 2020-03-01 ENCOUNTER — Other Ambulatory Visit: Payer: Self-pay

## 2020-03-01 ENCOUNTER — Ambulatory Visit (HOSPITAL_COMMUNITY)
Admission: RE | Admit: 2020-03-01 | Discharge: 2020-03-01 | Disposition: A | Discharge status: Home / Self Care | Payer: Medicaid Other | Source: Ambulatory Visit | Attending: Vascular Surgery | Admitting: Vascular Surgery

## 2020-03-01 ENCOUNTER — Ambulatory Visit (INDEPENDENT_AMBULATORY_CARE_PROVIDER_SITE_OTHER): Payer: Medicaid Other | Admitting: Physician Assistant

## 2020-03-01 VITALS — BP 132/85 | HR 73 | Temp 98.0°F | Resp 16 | Ht 66.75 in | Wt 171.5 lb

## 2020-03-01 DIAGNOSIS — Z992 Dependence on renal dialysis: Secondary | ICD-10-CM | POA: Diagnosis not present

## 2020-03-01 DIAGNOSIS — N186 End stage renal disease: Secondary | ICD-10-CM | POA: Diagnosis not present

## 2020-03-01 NOTE — Progress Notes (Signed)
Established Dialysis Access   History of Present Illness   Fred Harrison is a 54 y.o. (03-27-1966) male who presents for re-evaluation of permanent access.  Left radiocephalic fistula was created by Dr. Oneida Alar on 09/16/2019.  This fistula was slow to mature and patient underwent fistulogram with subsequent branch ligation in January 2021.  Patient states they have started using his fistula for dialysis with 1 needle and still connecting 1 TDC port.  Patient states he had been experiencing a lot of bruising when the dialysis center for started accessing his fistula however things appear to be improving per patient.  He denies any steal symptoms of left hand.  The patient's PMH, PSH, SH, and FamHx were reviewed and are unchanged from prior visit.  Current Outpatient Medications  Medication Sig Dispense Refill  . amLODipine (NORVASC) 10 MG tablet TAKE 1 TABLET (10 MG TOTAL) BY MOUTH DAILY. 30 tablet 0  . aspirin 81 MG tablet Take 1 tablet (81 mg total) by mouth daily. 30 tablet 0  . atorvastatin (LIPITOR) 20 MG tablet Take 1 tablet (20 mg total) by mouth daily. 90 tablet 3  . AURYXIA 1 GM 210 MG(Fe) tablet Take 210 mg by mouth 3 (three) times daily with meals.     . isosorbide mononitrate (IMDUR) 30 MG 24 hr tablet Take 1 tablet (30 mg total) by mouth daily. 30 tablet 2  . labetalol (NORMODYNE) 100 MG tablet Take 100 mg by mouth 2 (two) times daily.    . Multiple Vitamin (DAILY VITE) TABS Take 1 tablet by mouth daily.     No current facility-administered medications for this visit.    REVIEW OF SYSTEMS (negative unless checked):   Cardiac:  []  Chest pain or chest pressure? []  Shortness of breath upon activity? []  Shortness of breath when lying flat? []  Irregular heart rhythm?  Vascular:  []  Pain in calf, thigh, or hip brought on by walking? []  Pain in feet at night that wakes you up from your sleep? []  Blood clot in your veins? []  Leg swelling?  Pulmonary:  []  Oxygen at  home? []  Productive cough? []  Wheezing?  Neurologic:  []  Sudden weakness in arms or legs? []  Sudden numbness in arms or legs? []  Sudden onset of difficult speaking or slurred speech? []  Temporary loss of vision in one eye? []  Problems with dizziness?  Gastrointestinal:  []  Blood in stool? []  Vomited blood?  Genitourinary:  []  Burning when urinating? []  Blood in urine?  Psychiatric:  []  Major depression  Hematologic:  []  Bleeding problems? []  Problems with blood clotting?  Dermatologic:  []  Rashes or ulcers?  Constitutional:  []  Fever or chills?  Ear/Nose/Throat:  []  Change in hearing? []  Nose bleeds? []  Sore throat?  Musculoskeletal:  []  Back pain? []  Joint pain? []  Muscle pain?   Physical Examination   Vitals:   03/01/20 0837  BP: 132/85  Pulse: 73  Resp: 16  Temp: 98 F (36.7 C)  SpO2: 100%  Weight: 171 lb 8 oz (77.8 kg)  Height: 5' 6.75" (1.695 m)   Body mass index is 27.06 kg/m.  General:  WDWN in NAD; vital signs documented above Gait: Not observed HENT: WNL, normocephalic Pulmonary: normal non-labored breathing , without Rales, rhonchi,  wheezing Cardiac: regular HR Abdomen: soft, NT, no masses Skin: without rashes Extremities: Grip strength left hand intact; palpable thrill left radiocephalic fistula throughout forearm Musculoskeletal: no muscle wasting or atrophy  Neurologic: A&O X 3;  No focal weakness or paresthesias are  detected Psychiatric:  The pt has Normal affect.   Non-invasive Vascular Imaging   left Arm Access Duplex  :   Patent left radiocephalic fistula without any areas of stenosis  At least 5 mm in diameter throughout the path of the forearm    Medical Decision Making   Fred Harrison is a 54 y.o. male who presents with ESRD requiring hemodialysis.    Patent left radiocephalic fistula without any steal symptoms  Fistula duplex unremarkable; no areas of stenosis  Continue to transition from [facility] to  radiocephalic fistula  TDC can be removed when nephrology is comfortable with the performance of the fistula  Follow-up as needed   Dagoberto Ligas PA-C [facility] [place] Office: [facility] MD: Scot Dock

## 2020-03-07 ENCOUNTER — Telehealth: Payer: Self-pay | Admitting: Internal Medicine

## 2020-03-07 NOTE — Telephone Encounter (Signed)
Updated Cologuard results in Epic by going to [employer] & entering results.  Modifier was already changed to reflect Cologuard(3 years) instead of Colonoscopy.

## 2020-03-09 ENCOUNTER — Encounter: Payer: Self-pay | Admitting: Internal Medicine

## 2020-03-12 ENCOUNTER — Other Ambulatory Visit: Payer: Self-pay | Admitting: Internal Medicine

## 2020-03-12 DIAGNOSIS — I10 Essential (primary) hypertension: Secondary | ICD-10-CM

## 2020-03-12 MED FILL — ?ATORVASTATIN 20 MG TABLET: 20 | 30 days supply | Qty: 30 | Fill #6

## 2020-03-12 MED FILL — LABETALOL HCL 100 MG TABS: 100 | 30 days supply | Qty: 60 | Fill #1

## 2020-03-13 DIAGNOSIS — L729 Follicular cyst of the skin and subcutaneous tissue, unspecified: Secondary | ICD-10-CM | POA: Diagnosis not present

## 2020-03-13 DIAGNOSIS — R220 Localized swelling, mass and lump, head: Secondary | ICD-10-CM | POA: Diagnosis not present

## 2020-03-15 ENCOUNTER — Other Ambulatory Visit: Payer: Self-pay | Admitting: Pharmacist

## 2020-03-15 DIAGNOSIS — I10 Essential (primary) hypertension: Secondary | ICD-10-CM

## 2020-03-15 MED ORDER — ISOSORBIDE MONONITRATE ER 30 MG PO TB24: 30.0000 mg | ORAL_TABLET | Freq: Every day | ORAL | 2 refills | Status: DC

## 2020-03-15 MED FILL — ISOSORBIDE MN ER 30 MG TAB: 30 | 30 days supply | Qty: 30 | Fill #0

## 2020-03-23 DIAGNOSIS — Z992 Dependence on renal dialysis: Secondary | ICD-10-CM | POA: Diagnosis not present

## 2020-03-23 DIAGNOSIS — N186 End stage renal disease: Secondary | ICD-10-CM | POA: Diagnosis not present

## 2020-03-23 DIAGNOSIS — E875 Hyperkalemia: Secondary | ICD-10-CM | POA: Diagnosis not present

## 2020-04-20 MED FILL — ISOSORBIDE MN ER 30 MG TAB: 30 | 30 days supply | Qty: 30 | Fill #1

## 2020-04-20 MED FILL — LABETALOL HCL 100 MG TABS: 100 | 30 days supply | Qty: 60 | Fill #2

## 2020-04-20 MED FILL — ?ATORVASTATIN 20 MG TABLET: 20 | 30 days supply | Qty: 30 | Fill #7

## 2020-04-23 DIAGNOSIS — Z992 Dependence on renal dialysis: Secondary | ICD-10-CM | POA: Diagnosis not present

## 2020-04-23 DIAGNOSIS — N186 End stage renal disease: Secondary | ICD-10-CM | POA: Diagnosis not present

## 2020-04-23 DIAGNOSIS — E875 Hyperkalemia: Secondary | ICD-10-CM | POA: Diagnosis not present

## 2020-05-22 DIAGNOSIS — Z452 Encounter for adjustment and management of vascular access device: Secondary | ICD-10-CM | POA: Diagnosis not present

## 2020-05-23 DIAGNOSIS — E875 Hyperkalemia: Secondary | ICD-10-CM | POA: Diagnosis not present

## 2020-05-23 DIAGNOSIS — N186 End stage renal disease: Secondary | ICD-10-CM | POA: Diagnosis not present

## 2020-05-23 DIAGNOSIS — Z992 Dependence on renal dialysis: Secondary | ICD-10-CM | POA: Diagnosis not present

## 2020-06-07 ENCOUNTER — Encounter: Payer: Self-pay | Admitting: Internal Medicine

## 2020-06-07 ENCOUNTER — Ambulatory Visit: Payer: Medicaid Other | Attending: Internal Medicine | Admitting: Internal Medicine

## 2020-06-07 ENCOUNTER — Other Ambulatory Visit: Payer: Self-pay

## 2020-06-07 VITALS — BP 104/72 | HR 77 | Resp 16 | Wt 175.0 lb

## 2020-06-07 DIAGNOSIS — N186 End stage renal disease: Secondary | ICD-10-CM | POA: Diagnosis not present

## 2020-06-07 DIAGNOSIS — Z992 Dependence on renal dialysis: Secondary | ICD-10-CM

## 2020-06-07 DIAGNOSIS — I10 Essential (primary) hypertension: Secondary | ICD-10-CM | POA: Diagnosis not present

## 2020-06-07 DIAGNOSIS — E7800 Pure hypercholesterolemia, unspecified: Secondary | ICD-10-CM

## 2020-06-07 NOTE — Progress Notes (Signed)
Patient ID: Fred Harrison, male    DOB: Aug 11, 1966  MRN: [id number]  CC: Hypertension   Subjective: Fred Harrison is a 54 y.o. male who presents for chronic ds management His concerns today include:  Pt with hx of HTN, TIA, CKDstage5, former tob.  Purpose of today's visit is chronic disease management.   Saw derm since last visit.  Told he will need surgery to have scalp lesion on the LT frontal area removed.  Patient has decided to put it on hold for now as he is about to go through some work-up to get on the kidney transplant list  ESRD: saw nephrologist yesterday at HD. Told he will be placed on transplant list. Will be referred to [facility] to HD on M/W/F. BP has been good. Taking ang tolerating his blood pressure medications that include amlodipine, labetalol and isosorbide.  He does not take these medicines on the day of his dialysis.  Taking and tolerating Lipitor.  Lipitor  HM: given COVID vaccine 1 shot few mths ago at HD.  Not sure if it was the J&J or one of the other 2 for which she would need a second dose Patient Active Problem List   Diagnosis Date Noted  . Pure hypercholesterolemia 06/07/2020  . ESRD on hemodialysis ([place]) 10/06/2019  . Microcytic anemia 10/06/2019  . Acute on chronic renal failure ([place]) 09/13/2019  . Scalp mass 12/01/2017  . Hypokalemia   . TIA (transient ischemic attack) 08/03/2016  . HTN (hypertension) 08/03/2016     Current Outpatient Medications on File Prior to Visit  Medication Sig Dispense Refill  . amLODipine (NORVASC) 10 MG tablet TAKE 1 TABLET (10 MG TOTAL) BY MOUTH DAILY. 30 tablet 0  . aspirin 81 MG tablet Take 1 tablet (81 mg total) by mouth daily. 30 tablet 0  . atorvastatin (LIPITOR) 20 MG tablet Take 1 tablet (20 mg total) by mouth daily. 90 tablet 3  . AURYXIA 1 GM 210 MG(Fe) tablet Take 210 mg by mouth 3 (three) times daily with meals.     . isosorbide mononitrate (IMDUR) 30 MG 24 hr tablet Take 1 tablet (30 mg total)  by mouth daily. 30 tablet 2  . labetalol (NORMODYNE) 100 MG tablet Take 100 mg by mouth 2 (two) times daily.    . Multiple Vitamin (DAILY VITE) TABS Take 1 tablet by mouth daily.     No current facility-administered medications on file prior to visit.    No Known Allergies  Social History   Socioeconomic History  . Marital status: Divorced    Spouse name: Not on file  . Number of children: Not on file  . Years of education: Not on file  . Highest education level: Not on file  Occupational History  . Not on file  Tobacco Use  . Smoking status: Former Smoker    Types: Cigarettes    Quit date: 07/02/2016    Years since quitting: 3.9  . Smokeless tobacco: Never Used  Vaping Use  . Vaping Use: Never used  Substance and Sexual Activity  . Alcohol use: No  . Drug use: No  . Sexual activity: Not on file  Other Topics Concern  . Not on file  Social History Narrative  . Not on file   Social Determinants of Health   Financial Resource Strain:   . Difficulty of Paying Living Expenses:   Food Insecurity:   . Worried About Charity fundraiser in the Last Year:   .  Ran Out of Food in the Last Year:   Transportation Needs:   . Film/video editor (Medical):   Marland Kitchen Lack of Transportation (Non-Medical):   Physical Activity:   . Days of Exercise per Week:   . Minutes of Exercise per Session:   Stress:   . Feeling of Stress :   Social Connections:   . Frequency of Communication with Friends and Family:   . Frequency of Social Gatherings with Friends and Family:   . Attends Religious Services:   . Active Member of Clubs or Organizations:   . Attends Archivist Meetings:   Marland Kitchen Marital Status:   Intimate Partner Violence:   . Fear of Current or Ex-Partner:   . Emotionally Abused:   Marland Kitchen Physically Abused:   . Sexually Abused:     Family History  Problem Relation Age of Onset  . Stroke Mother   . Hypertension Mother   . Heart attack Father   . Cancer Sister   . Colon  cancer Neg Hx   . Colon polyps Neg Hx   . Esophageal cancer Neg Hx   . Stomach cancer Neg Hx   . Rectal cancer Neg Hx     Past Surgical History:  Procedure Laterality Date  . A/V FISTULAGRAM N/A 12/13/2019   Procedure: A/V FISTULAGRAM - Left Arm;  Surgeon: Serafina Mitchell, MD;  Location: [facility];  Service: Cardiovascular;  Laterality: N/A;  . AV FISTULA PLACEMENT Left 09/16/2019   Procedure: ARTERIOVENOUS (AV) FISTULA CREATION;  Surgeon: Elam Dutch, MD;  Location: [facility];  Service: Vascular;  Laterality: Left;  . IR FLUORO GUIDE CV LINE RIGHT  09/14/2019  . IR US GUIDE VASC ACCESS RIGHT  09/14/2019  . LIGATION OF COMPETING BRANCHES OF ARTERIOVENOUS FISTULA Left 12/13/2019   Procedure: BRANCH LIGATION OF LEFT ARTERIOVENOUS FISTULA;  Surgeon: Angelia Mould, MD;  Location: [facility];  Service: Vascular;  Laterality: Left;    ROS: Review of Systems Negative except as stated above  PHYSICAL EXAM: BP 104/72   Pulse 77   Resp 16   Wt 175 lb (79.4 kg)   SpO2 99%   BMI 27.61 kg/m   Physical Exam General appearance - alert, well appearing, and in no distress Mental status - normal mood, behavior, speech, dress, motor activity, and thought processes Mouth - mucous membranes moist, pharynx normal without lesions Neck - supple, no significant adenopathy Chest - clear to auscultation, no wheezes, rales or rhonchi, symmetric air entry Heart - normal rate, regular rhythm, normal S1, S2, no murmurs, rubs, clicks or gallops Extremities - peripheral pulses normal, no pedal edema, no clubbing or cyanosis  CMP Latest Ref Rng & Units 12/13/2019 10/06/2019 09/17/2019  Glucose 70 - 99 mg/dL 92 85 159(H)  BUN 6 - 20 mg/dL 43(H) 24 86(H)  Creatinine 0.61 - 1.24 mg/dL 11.30(H) 7.59(H) 14.53(H)  Sodium 135 - 145 mmol/L 140 141 135  Potassium 3.5 - 5.1 mmol/L 4.4 3.8 4.1  Chloride 98 - 111 mmol/L 97(L) 97 97(L)  CO2 20 - 29 mmol/L - 26 22  Calcium 8.7 - 10.2 mg/dL - 9.0 6.9(L)    Total Protein 6.5 - 8.1 g/dL - - -  Total Bilirubin 0.3 - 1.2 mg/dL - - -  Alkaline Phos 38 - 126 U/L - - -  AST 15 - 41 U/L - - -  ALT 0 - 44 U/L - - -   Lipid Panel     Component Value Date/Time  CHOL 163 09/06/2018 1213   TRIG 141 09/06/2018 1213   HDL 32 (L) 09/06/2018 1213   CHOLHDL 5.1 (H) 09/06/2018 1213   CHOLHDL 4.6 08/04/2016 0340   VLDL 34 08/04/2016 0340   LDLCALC 103 (H) 09/06/2018 1213    CBC    Component Value Date/Time   WBC 5.7 10/06/2019 1154   WBC 6.5 09/17/2019 0824   RBC 4.34 10/06/2019 1154   RBC 3.81 (L) 09/17/2019 0824   HGB 15.3 12/13/2019 0559   HGB 10.6 (L) 10/06/2019 1154   HCT 45.0 12/13/2019 0559   HCT 35.1 (L) 10/06/2019 1154   PLT 332 10/06/2019 1154   MCV 81 10/06/2019 1154   MCH 24.4 (L) 10/06/2019 1154   MCH 24.4 (L) 09/17/2019 0824   MCHC 30.2 (L) 10/06/2019 1154   MCHC 30.9 09/17/2019 0824   RDW 14.8 10/06/2019 1154   LYMPHSABS 0.4 (L) 09/13/2019 0738   MONOABS 0.4 09/13/2019 0738   EOSABS 0.2 09/13/2019 0738   BASOSABS 0.1 09/13/2019 0738    ASSESSMENT AND PLAN: 1. Essential hypertension At goal.  Continue current medications and low-salt diet  2. ESRD on hemodialysis ([place]) Compliant with hemodialysis.  He is about to be evaluated for transplant list  3. Pure hypercholesterolemia Continue Lipitor though benefits and question in persons on hemodialysis.    Patient was given the opportunity to ask questions.  Patient verbalized understanding of the plan and was able to repeat key elements of the plan.   No orders of the defined types were placed in this encounter.    Requested Prescriptions    No prescriptions requested or ordered in this encounter    Return in about 4 months (around 10/08/2020).  Karle Plumber, MD, FACP

## 2020-06-11 ENCOUNTER — Ambulatory Visit: Payer: Medicaid Other | Admitting: Internal Medicine

## 2020-06-26 ENCOUNTER — Other Ambulatory Visit: Payer: Self-pay | Admitting: Internal Medicine

## 2020-06-26 MED FILL — LABETALOL HCL 100 MG TABLET: 100 | 30 days supply | Qty: 60 | Fill #3

## 2020-06-26 MED FILL — ISOSORBIDE MN ER 30 MG TAB: 30 | 30 days supply | Qty: 30 | Fill #2

## 2020-06-26 NOTE — Telephone Encounter (Signed)
Requested medication (s) are due for refill today:   Yes  Requested medication (s) are on the active medication list:   Yes  Future visit scheduled:   No but was seen 2 wks ago by Dr. Wynetta Emery for hypertension   Last ordered: 05/18/2019 #90, RF 3  Returned because lab work is due so protocol failed.   Requested Prescriptions  Pending Prescriptions Disp Refills   atorvastatin (LIPITOR) 20 MG tablet [Pharmacy Med Name: ATORVASTATIN 20 MG TABLET 20 Tablet] 30 tablet 3    Sig: Take 1 tablet (20 mg total) by mouth daily.      Cardiovascular:  Antilipid - Statins Failed - 06/26/2020 10:52 AM      Failed - Total Cholesterol in normal range and within 360 days    Cholesterol, Total  Date Value Ref Range Status  09/06/2018 163 100 - 199 mg/dL Final          Failed - LDL in normal range and within 360 days    LDL Calculated  Date Value Ref Range Status  09/06/2018 103 (H) 0 - 99 mg/dL Final          Failed - HDL in normal range and within 360 days    HDL  Date Value Ref Range Status  09/06/2018 32 (L) >39 mg/dL Final          Failed - Triglycerides in normal range and within 360 days    Triglycerides  Date Value Ref Range Status  09/06/2018 141 0 - 149 mg/dL Final          Passed - Patient is not pregnant      Passed - Valid encounter within last 12 months    Recent Outpatient Visits           2 weeks ago Essential hypertension   [place], Deborah B, MD   4 months ago Essential hypertension   [place], Deborah B, MD   8 months ago Need for influenza vaccination   [place], Jarome Matin, RPH-CPP   8 months ago Essential hypertension   [place] Ladell Pier, MD   1 year ago Essential hypertension   [place] Ladell Pier, MD

## 2020-06-27 ENCOUNTER — Other Ambulatory Visit: Payer: Self-pay | Admitting: Internal Medicine

## 2020-06-27 MED FILL — ATORVASTATIN CALCIUM 20 MG: 20 | 30 days supply | Qty: 30 | Fill #0

## 2020-07-02 MED FILL — ISOSORBIDE MN ER 30 MG TAB: 30 | 30 days supply | Qty: 30 | Fill #2

## 2020-07-02 MED FILL — LABETALOL HCL 100 MG TABLET: 100 | 30 days supply | Qty: 60 | Fill #3

## 2020-08-22 IMAGING — DX DG CHEST 1V PORT
2 series · 2 of 2 positions shown · non-contrast
Comparison: 08/03/2016

CLINICAL DATA: Syncope

EXAM:
PORTABLE CHEST 1 VIEW

[chest ap (1 of 2)]
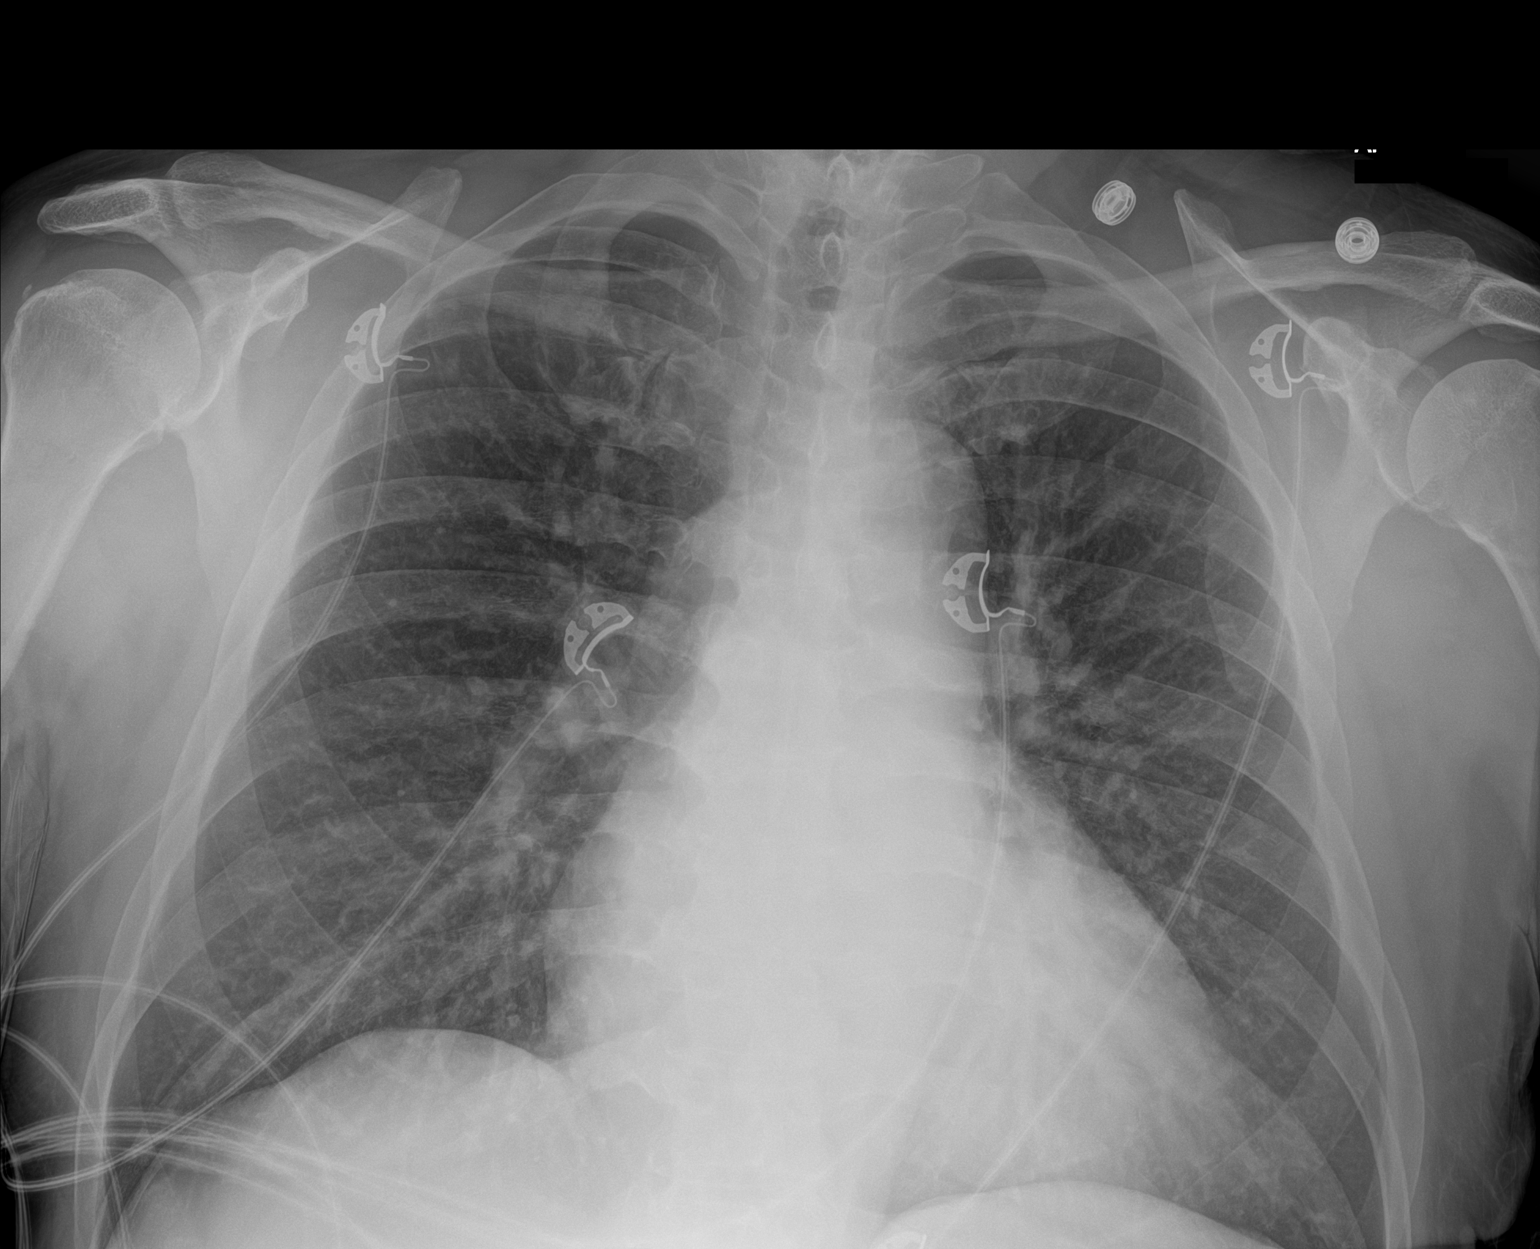

[chest ap (2 of 2)]
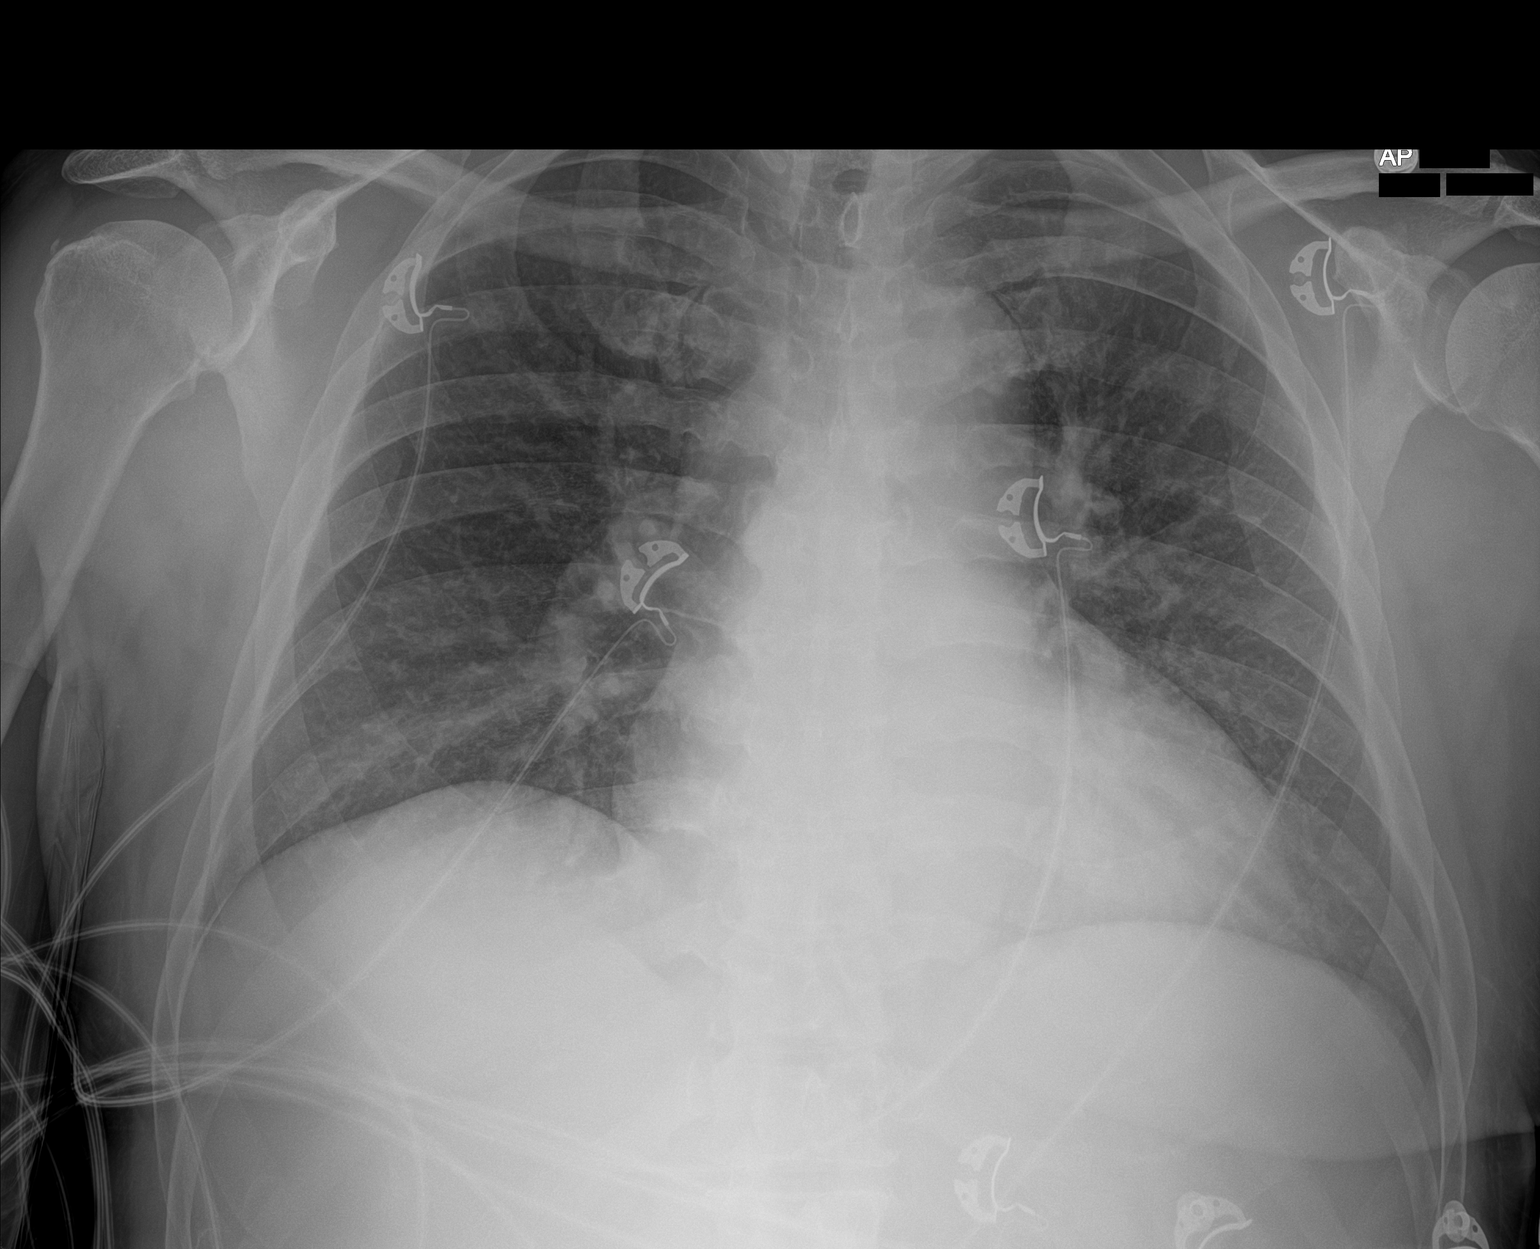

[2 of 2 positions shown; findings below may reference images not displayed]

FINDINGS: Mild cardiomegaly and vascular congestion. No confluent opacities or
effusions. No acute bony abnormality.
IMPRESSION: Cardiomegaly, vascular congestion.

## 2020-08-23 IMAGING — XA IR FLUORO GUIDE CV LINE*R*
1 series · 1 of 1 positions shown · non-contrast
Comparison: none

INDICATION: 53-year-old male with progressive renal failure in need of urgent
hemodialysis.

[Series 1: fl (-) angio · 1 of 1 slices shown]
[im 1/1]
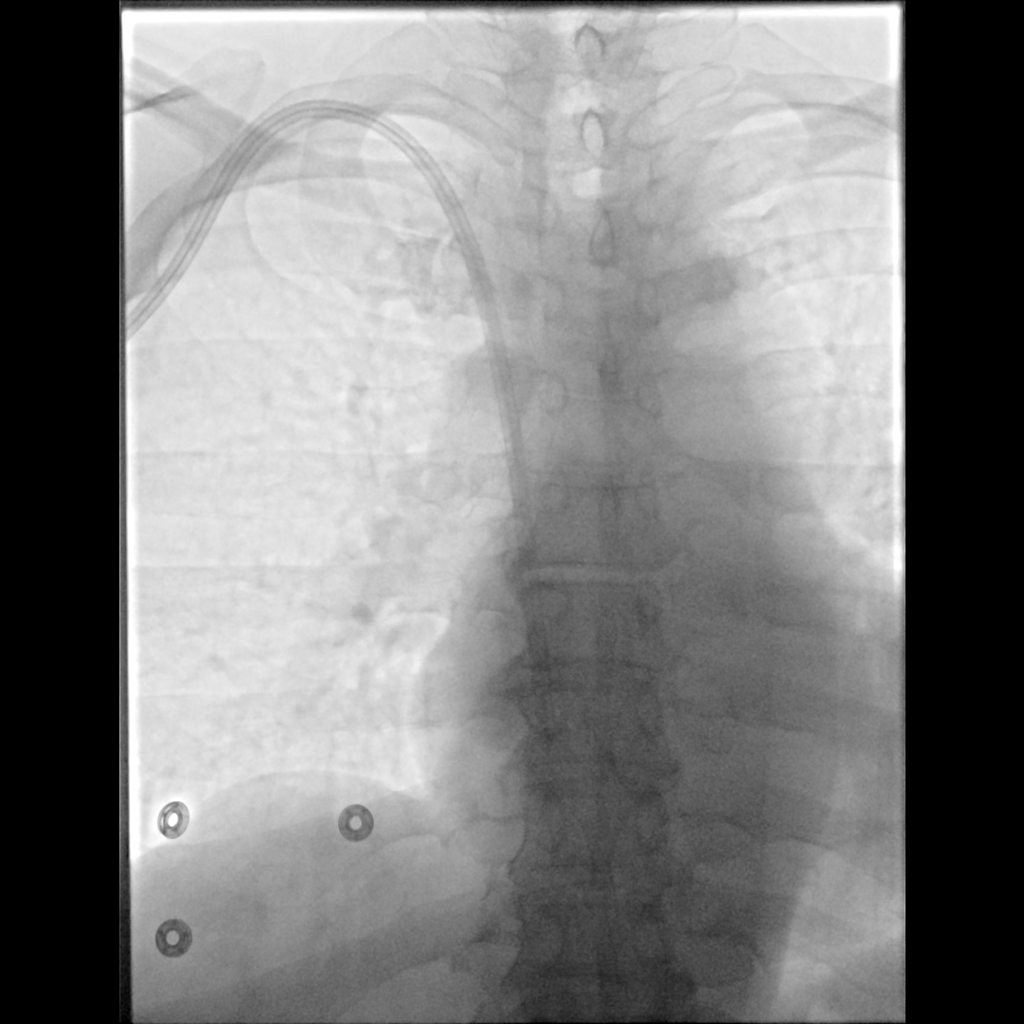

[1 of 1 positions shown; findings below may reference images not displayed]

EXAM:
TUNNELED CENTRAL VENOUS HEMODIALYSIS CATHETER PLACEMENT WITH
ULTRASOUND AND FLUOROSCOPIC GUIDANCE

MEDICATIONS:
2 g Ancef. The antibiotic was given in an appropriate time interval
prior to skin puncture.

ANESTHESIA/SEDATION:
Moderate (conscious) sedation was employed during this procedure. A
total of Versed 2 mg and Fentanyl 50 mcg was administered
intravenously.

Moderate Sedation Time: 20 minutes. The patient's level of
consciousness and vital signs were monitored continuously by
radiology nursing throughout the procedure under my direct
supervision.

FLUOROSCOPY TIME:  Fluoroscopy Time: 0 minutes 24 seconds (2 mGy).

COMPLICATIONS:
None immediate.



After creating a small venotomy incision, a micropuncture kit was
utilized to access the right internal jugular vein under direct,
real-time ultrasound guidance after the overlying soft tissues were
anesthetized with 1% lidocaine with epinephrine. Ultrasound image
documentation was performed. The microwire was kinked to measure
appropriate catheter length. A stiff Glidewire was advanced to the
level of the IVC and the micropuncture sheath was exchanged for a
peel-away sheath. A palindrome tunneled hemodialysis catheter
measuring 23 cm from tip to cuff was tunneled in a retrograde
fashion from the anterior chest wall to the venotomy incision.

The catheter was then placed through the peel-away sheath with tips
ultimately positioned within the superior aspect of the right
atrium. Final catheter positioning was confirmed and documented with
a spot radiographic image. The catheter aspirates and flushes
normally. The catheter was flushed with appropriate volume heparin
dwells.

The catheter exit site was secured with a 0-Prolene retention
suture. The venotomy incision was closed with Dermabond. Dressings
were applied. The patient tolerated the procedure well without
immediate post procedural complication.
IMPRESSION: Successful placement of 23 cm tip to cuff tunneled hemodialysis
catheter via the right internal jugular vein with tips terminating
within the superior aspect of the right atrium. The catheter is
ready for immediate use.

## 2020-09-20 ENCOUNTER — Other Ambulatory Visit: Payer: Self-pay | Admitting: Internal Medicine

## 2020-09-20 DIAGNOSIS — I10 Essential (primary) hypertension: Secondary | ICD-10-CM

## 2020-09-20 MED FILL — ISOSORBIDE MN ER 30 MG TAB: 30 | 30 days supply | Qty: 30 | Fill #0

## 2020-09-20 MED FILL — ATORVASTATIN CALCIUM 20 MG: 20 | 30 days supply | Qty: 30 | Fill #1

## 2020-09-20 MED FILL — LABETALOL HCL 100 MG TABLET: 100 | 30 days supply | Qty: 60 | Fill #4

## 2020-09-20 NOTE — Telephone Encounter (Signed)
Approved per protocol. Requested Prescriptions  Pending Prescriptions Disp Refills   isosorbide mononitrate (IMDUR) 30 MG 24 hr tablet [Pharmacy Med Name: ISOSORBIDE MN ER 30 MG TAB 30 Tablet] 30 tablet 2    Sig: TAKE 1 TABLET (30 MG TOTAL) BY MOUTH DAILY.     Cardiovascular:  Nitrates Passed - 09/20/2020 12:24 PM      Passed - Last BP in normal range    BP Readings from Last 1 Encounters:  06/07/20 104/72         Passed - Last Heart Rate in normal range    Pulse Readings from Last 1 Encounters:  06/07/20 77         Passed - Valid encounter within last 12 months    Recent Outpatient Visits          3 months ago Essential hypertension   [place], MD   7 months ago Essential hypertension   [place], MD   11 months ago Need for influenza vaccination   [place], RPH-CPP   11 months ago Essential hypertension   [place] Ladell Pier, MD   1 year ago Essential hypertension   [place] Ladell Pier, MD

## 2020-09-27 MED FILL — ATORVASTATIN CALCIUM 20 MG: 20 | 30 days supply | Qty: 30 | Fill #1

## 2020-09-27 MED FILL — ISOSORBIDE MN ER 30 MG TAB: 30 | 30 days supply | Qty: 30 | Fill #0

## 2020-09-27 MED FILL — LABETALOL HCL 100 MG TABLET: 100 | 30 days supply | Qty: 60 | Fill #4

## 2020-11-20 MED FILL — LABETALOL HCL 100 MG TABS: 100 | 30 days supply | Qty: 60 | Fill #5

## 2020-11-20 MED FILL — ATORVASTATIN CALCIUM 20 MG: 20 | 30 days supply | Qty: 30 | Fill #2

## 2020-11-20 MED FILL — ISOSORBIDE MN ER 30 MG TAB: 30 | 30 days supply | Qty: 30 | Fill #1

## 2021-01-21 MED FILL — ATORVASTATIN CALCIUM 20 MG: 20 | 30 days supply | Qty: 30 | Fill #3

## 2021-01-21 MED FILL — ISOSORBIDE MN ER 30 MG TAB: 30 | 30 days supply | Qty: 30 | Fill #2

## 2021-01-23 ENCOUNTER — Other Ambulatory Visit: Payer: Self-pay | Admitting: Nephrology

## 2021-01-24 MED FILL — LABETALOL HCL 100 MG TABS: 100 | 90 days supply | Qty: 180 | Fill #0

## 2021-01-31 MED FILL — LABETALOL HCL 100 MG TABS: 100 | 90 days supply | Qty: 180 | Fill #0

## 2021-02-20 ENCOUNTER — Other Ambulatory Visit: Payer: Self-pay | Admitting: Nephrology

## 2021-02-20 MED FILL — LOKELMA 10 GM PACK: 10 | 30 days supply | Qty: 30 | Fill #0

## 2021-03-17 ENCOUNTER — Other Ambulatory Visit: Payer: Self-pay | Admitting: Internal Medicine

## 2021-03-17 DIAGNOSIS — I10 Essential (primary) hypertension: Secondary | ICD-10-CM

## 2021-03-17 MED ORDER — ISOSORBIDE MONONITRATE ER 30 MG PO TB24
ORAL_TABLET | Freq: Three times a day (TID) | ORAL | 0 refills | Status: DC | PRN
  Filled 2021-03-17: qty 90, 90d supply, fill #0

## 2021-03-17 MED FILL — Sodium Zirconium Cyclosilicate For Susp Packet 10 GM: ORAL | 30 days supply | Qty: 30 | Fill #0 | Status: AC

## 2021-03-17 NOTE — Telephone Encounter (Signed)
Requested Prescriptions  Pending Prescriptions Disp Refills  . atorvastatin (LIPITOR) 20 MG tablet 30 tablet     Sig: TAKE 1 TABLET (20 MG TOTAL) BY MOUTH DAILY.     Cardiovascular:  Antilipid - Statins Failed - 03/17/2021  6:57 AM      Failed - Total Cholesterol in normal range and within 360 days    Cholesterol, Total  Date Value Ref Range Status  09/06/2018 163 100 - 199 mg/dL Final         Failed - LDL in normal range and within 360 days    LDL Calculated  Date Value Ref Range Status  09/06/2018 103 (H) 0 - 99 mg/dL Final         Failed - HDL in normal range and within 360 days    HDL  Date Value Ref Range Status  09/06/2018 32 (L) >39 mg/dL Final         Failed - Triglycerides in normal range and within 360 days    Triglycerides  Date Value Ref Range Status  09/06/2018 141 0 - 149 mg/dL Final         Passed - Patient is not pregnant      Passed - Valid encounter within last 12 months    Recent Outpatient Visits          9 months ago Essential hypertension   [place] Ladell Pier, MD   1 year ago Essential hypertension   [place] Ladell Pier, MD   1 year ago Need for influenza vaccination   [place] Bloss[place], Jarome Matin, RPH-CPP   1 year ago Essential hypertension   [place] Ladell Pier, MD   1 year ago Essential hypertension   [place], Deborah B, MD             . isosorbide mononitrate (IMDUR) 30 MG 24 hr tablet 90 tablet 0    Sig: TAKE 1 TABLET (30 MG TOTAL) BY MOUTH DAILY.     Cardiovascular:  Nitrates Passed - 03/17/2021  6:57 AM      Passed - Last BP in normal range    BP Readings from Last 1 Encounters:  06/07/20 104/72         Passed - Last Heart Rate in normal range    Pulse Readings from Last 1 Encounters:  06/07/20 77         Passed - Valid  encounter within last 12 months    Recent Outpatient Visits          9 months ago Essential hypertension   [place] Ladell Pier, MD   1 year ago Essential hypertension   [place] Ladell Pier, MD   1 year ago Need for influenza vaccination   [place] Bl[place], Jarome Matin, RPH-CPP   1 year ago Essential hypertension   [place] Ladell Pier, MD   1 year ago Essential hypertension   [place] Ladell Pier, MD

## 2021-03-18 ENCOUNTER — Other Ambulatory Visit: Payer: Self-pay

## 2021-03-19 ENCOUNTER — Other Ambulatory Visit: Payer: Self-pay | Admitting: Internal Medicine

## 2021-03-19 ENCOUNTER — Other Ambulatory Visit: Payer: Self-pay

## 2021-03-19 MED ORDER — ATORVASTATIN CALCIUM 20 MG PO TABS
ORAL_TABLET | Freq: Every day | ORAL | 3 refills | Status: AC
  Filled 2021-03-19: qty 30, 30d supply, fill #0
  Filled 2021-04-28: qty 30, 30d supply, fill #1
  Filled 2021-08-17: qty 30, 30d supply, fill #2

## 2021-03-20 ENCOUNTER — Other Ambulatory Visit: Payer: Self-pay

## 2021-04-25 ENCOUNTER — Ambulatory Visit: Payer: Self-pay | Admitting: *Deleted

## 2021-04-25 ENCOUNTER — Telehealth: Payer: Self-pay | Admitting: Internal Medicine

## 2021-04-25 DIAGNOSIS — Z1211 Encounter for screening for malignant neoplasm of colon: Secondary | ICD-10-CM

## 2021-04-25 NOTE — Telephone Encounter (Signed)
Copied from [place] [id number]. Topic: Referral - Request for Referral >> Apr 25, 2021  8:51 AM Lennox Solders wrote: Has patient seen PCP for this complaint? no *If NO, is insurance requiring patient see PCP for this issue before PCP can refer them? Referral for which specialty: GI Reason for referral: Colonoscopy per [facility] team. Pt had cologuard and they recommend colonoscopy

## 2021-04-25 NOTE — Telephone Encounter (Signed)
Pt called stating he passed out after HD on 04/24/21; he assumed he fainted because when he woke up his son was standing over him; he is not sure how long he was unconsciousness; his BP after the episode was 100/90 and he is normally 120-130s/80s; he was told his BP was low prior to HD but after they checked it the 3rd time he was allow to have HD x 4 hours; his BP this morning was 121/91 and was obtained by home cuff on his right upper arm;; the pt says he drove home from HD and jogged upstairs to his storage area when this occurred; he believes this may have caused him to pass out and says he was fine the remainder of the day; recommendations made per nurse triage protocol; he verbalized understanding but would like to be seen by his PCP; the pt is seen by Dr Karle Plumber, [facility]; attempted to conact the flow coordinator by phone without success; pt notified and can be contacted at [phone]; will route to office for pt contact and scheduling.   Reason for Disposition . [1] Age > 50 years  AND [2] now alert and feels fine  Answer Assessment - Initial Assessment Questions 1. ONSET: "How long were you unconscious?" (minutes) "When did it happen?"    Unsure 121/91 right upper arm 2. CONTENT: "What happened during period of unconsciousness?" (e.g., seizure activity)      Pt says his son was standing over him when he woke up 3. MENTAL STATUS: "Alert and oriented now?" (oriented x 3 = name, month, location)     A&) x 3 4. TRIGGER: "What do you think caused the fainting?" "What were you doing just before you fainted?"  (e.g., exercise, sudden standing up, prolonged standing) Pt previously had HD x 4 hours 5. RECURRENT SYMPTOM: "Have you ever passed out before?" If Yes, ask: "When was the last time?" and "What happened that time?"      Yes 2 years ago; when he took BP meds and his BP dropped 6. INJURY: "Did you sustain any injury during the fall?"      no 7. CARDIAC SYMPTOMS:  "Have you had any of the following symptoms: chest pain, difficulty breathing, palpitations?"     no 8. NEUROLOGIC SYMPTOMS: "Have you had any of the following symptoms: headache, numbness, vertigo, weakness?"     lightheaded 9. GI SYMPTOMS: "Have you had any of the following symptoms: abdominal pain, vomiting, diarrhea, blood in stools?"     No; pt needs colonoscopy to be placed on the transplant list  10. OTHER SYMPTOMS: "Do you have any other symptoms?"       no 11. PREGNANCY: "Is there any chance you are pregnant?" "When was your last menstrual period?"       n/a  Protocols used: [facility]

## 2021-04-26 NOTE — Telephone Encounter (Signed)
Please contact pt and schedule  

## 2021-04-26 NOTE — Telephone Encounter (Signed)
Will forward to provider  

## 2021-04-28 MED FILL — Sodium Zirconium Cyclosilicate For Susp Packet 10 GM: ORAL | 30 days supply | Qty: 30 | Fill #1 | Status: AC

## 2021-04-29 ENCOUNTER — Other Ambulatory Visit: Payer: Self-pay

## 2021-04-30 ENCOUNTER — Other Ambulatory Visit: Payer: Self-pay

## 2021-04-30 NOTE — Telephone Encounter (Signed)
Patient has been given a sooner appt.

## 2021-06-06 ENCOUNTER — Other Ambulatory Visit: Payer: Self-pay

## 2021-06-06 ENCOUNTER — Ambulatory Visit: Payer: Medicare Other | Attending: Internal Medicine | Admitting: Internal Medicine

## 2021-06-06 ENCOUNTER — Encounter: Payer: Self-pay | Admitting: Internal Medicine

## 2021-06-06 VITALS — BP 105/68 | HR 81 | Resp 16 | Ht 66.0 in | Wt 180.0 lb

## 2021-06-06 DIAGNOSIS — Z1211 Encounter for screening for malignant neoplasm of colon: Secondary | ICD-10-CM

## 2021-06-06 DIAGNOSIS — Z79899 Other long term (current) drug therapy: Secondary | ICD-10-CM | POA: Diagnosis not present

## 2021-06-06 DIAGNOSIS — I120 Hypertensive chronic kidney disease with stage 5 chronic kidney disease or end stage renal disease: Secondary | ICD-10-CM | POA: Diagnosis not present

## 2021-06-06 DIAGNOSIS — Z23 Encounter for immunization: Secondary | ICD-10-CM | POA: Diagnosis not present

## 2021-06-06 DIAGNOSIS — N186 End stage renal disease: Secondary | ICD-10-CM

## 2021-06-06 DIAGNOSIS — R220 Localized swelling, mass and lump, head: Secondary | ICD-10-CM

## 2021-06-06 DIAGNOSIS — Z992 Dependence on renal dialysis: Secondary | ICD-10-CM | POA: Diagnosis not present

## 2021-06-06 DIAGNOSIS — Z8249 Family history of ischemic heart disease and other diseases of the circulatory system: Secondary | ICD-10-CM | POA: Insufficient documentation

## 2021-06-06 DIAGNOSIS — Z7982 Long term (current) use of aspirin: Secondary | ICD-10-CM | POA: Insufficient documentation

## 2021-06-06 DIAGNOSIS — Z8673 Personal history of transient ischemic attack (TIA), and cerebral infarction without residual deficits: Secondary | ICD-10-CM | POA: Insufficient documentation

## 2021-06-06 DIAGNOSIS — I10 Essential (primary) hypertension: Secondary | ICD-10-CM

## 2021-06-06 DIAGNOSIS — Z87891 Personal history of nicotine dependence: Secondary | ICD-10-CM | POA: Diagnosis not present

## 2021-06-06 NOTE — Patient Instructions (Signed)
Zoster Vaccine, Recombinant injection What is this medication? ZOSTER VACCINE (ZOS ter vak SEEN) is a vaccine used to reduce the risk of getting shingles. This vaccine is not used to treat shingles or nerve pain fromshingles. This medicine may be used for other purposes; ask your health care provider orpharmacist if you have questions. COMMON BRAND NAME(S): [facility] What should I tell my care team before I take this medication? They need to know if you have any of these conditions: cancer immune system problems an unusual or allergic reaction to Zoster vaccine, other medications, foods, dyes, or preservatives pregnant or trying to get pregnant breast-feeding How should I use this medication? This vaccine is injected into a muscle. It is given by a health care provider. A copy of Vaccine Information Statements will be given before each vaccination. Be sure to read this information carefully each time. This sheet may changeoften. Talk to your health care provider about the use of this vaccine in children.This vaccine is not approved for use in children. Overdosage: If you think you have taken too much of this medicine contact apoison control center or emergency room at once. NOTE: This medicine is only for you. Do not share this medicine with others. What if I miss a dose? Keep appointments for follow-up (booster) doses. It is important not to miss your dose. Call your health care provider if you are unable to keep anappointment. What may interact with this medication? medicines that suppress your immune system medicines to treat cancer steroid medicines like prednisone or cortisone This list may not describe all possible interactions. Give your health care provider a list of all the medicines, herbs, non-prescription drugs, or dietary supplements you use. Also tell them if you smoke, drink alcohol, or use illegaldrugs. Some items may interact with your medicine. What should I watch for while  using this medication? Visit your health care provider regularly. This vaccine, like all vaccines, may not fully protect everyone. What side effects may I notice from receiving this medication? Side effects that you should report to your doctor or health care professionalas soon as possible: allergic reactions (skin rash, itching or hives; swelling of the face, lips, or tongue) trouble breathing Side effects that usually do not require medical attention (report these toyour doctor or health care professional if they continue or are bothersome): chills headache fever nausea pain, redness, or irritation at site where injected tiredness vomiting This list may not describe all possible side effects. Call your doctor for medical advice about side effects. You may report side effects to FDA at1-800-FDA-1088. Where should I keep my medication? This vaccine is only given by a health care provider. It will not be stored athome. NOTE: This sheet is a summary. It may not cover all possible information. If you have questions about this medicine, talk to your doctor, pharmacist, orhealth care provider.  2022 Elsevier/Gold Standard (2019-12-16 16:23:07)

## 2021-06-06 NOTE — Progress Notes (Signed)
Patient ID: Fred Harrison, male    DOB: [birth year]  MRN: [id number]  CC: Hypertension   Subjective: Fred Harrison is a 55 y.o. male who presents for chronic ds management His concerns today include: Pt with hx of HTN, TIA, ESRD on HD, former tob.  Last seen 1 yr ago.  HTN/ESRD on HD -goes to HD on Mon/Wed/Frid On Isorbide 30 mg once a day (current med list says Q 8 hrs PRN), Norvasc and labetalol.  Taking Lipitor as prescribed and needs RF No CP/SOB/LE edema 1 mth ago, he went into his storage room at home.  Felt dizzy "and the next thing I know I was on the floor sitting on my rear."  No LOC.  Got up and checked his BP and it was low like 90s/60.  BP runs low after HD and this occurred after he came home from HD that day.  No episodes since then. Normally he holds his BP meds on HD days.  Gets blood tests at HD. On waiting list for transplant at [facility].  Told he needs c-scope.  They will not accept the negative  Cologuard test he had done last yr.  Saw surgeon yesterday for the lump on scalp.  Plan is for surgery but states they plan to coordinate with his nephrologist since surgery days are only on his HD days.  HM: He has the dates of his vaccines entered into his phone and gave them to me today.  He received these vaccines at his HD center.  He had Pneumonia vaccine 01/21/21 at HD, flu shot 01/17/21, COVID Moderna booster 05/23/21, Moderna 02/01/21, 01/15/20 Moderna, 03/16/2020 - all at HD.  Due for shingles vaccine. Patient Active Problem List   Diagnosis Date Noted   Pure hypercholesterolemia 06/07/2020   ESRD on hemodialysis ([place]) 10/06/2019   Microcytic anemia 10/06/2019   Acute on chronic renal failure ([place]) 09/13/2019   Scalp mass 12/01/2017   Hypokalemia    TIA (transient ischemic attack) 08/03/2016   HTN (hypertension) 08/03/2016     Current Outpatient Medications on File Prior to Visit  Medication Sig Dispense Refill   amLODipine (NORVASC) 10 MG tablet TAKE 1 TABLET (10  MG TOTAL) BY MOUTH DAILY. 30 tablet 0   aspirin 81 MG tablet Take 1 tablet (81 mg total) by mouth daily. 30 tablet 0   atorvastatin (LIPITOR) 20 MG tablet TAKE 1 TABLET (20 MG TOTAL) BY MOUTH DAILY. 30 tablet 3   AURYXIA 1 GM 210 MG(Fe) tablet Take 210 mg by mouth 4 (four) times daily.     isosorbide mononitrate (IMDUR) 30 MG 24 hr tablet TAKE 1 TABLET (30 MG TOTAL) BY MOUTH DAILY. 90 tablet 0   labetalol (NORMODYNE) 100 MG tablet Take 100 mg by mouth 2 (two) times daily.     labetalol (NORMODYNE) 100 MG tablet TAKE 1 TABLET BY MOUTH 2 TIMES DAILY 180 tablet 11   Multiple Vitamin (DAILY VITE) TABS Take 1 tablet by mouth daily.     sodium zirconium cyclosilicate (LOKELMA) 10 g PACK packet TAKE 1 PACKET BY MOUTH AS DIRECTED TAKE ONCE DAILY ON NON-DIALYSIS DAYS 50 each 3   No current facility-administered medications on file prior to visit.    No Known Allergies  Social History   Socioeconomic History   Marital status: Divorced    Spouse name: Not on file   Number of children: Not on file   Years of education: Not on file   Highest education level: Not on file  Occupational History   Not on file  Tobacco Use   Smoking status: Former    Types: Cigarettes    Quit date: 07/02/2016    Years since quitting: 4.9   Smokeless tobacco: Never  Vaping Use   Vaping Use: Never used  Substance and Sexual Activity   Alcohol use: No   Drug use: No   Sexual activity: Not on file  Other Topics Concern   Not on file  Social History Narrative   Not on file   Social Determinants of Health   Financial Resource Strain: Not on file  Food Insecurity: Not on file  Transportation Needs: Not on file  Physical Activity: Not on file  Stress: Not on file  Social Connections: Not on file  Intimate Partner Violence: Not on file    Family History  Problem Relation Age of Onset   Stroke Mother    Hypertension Mother    Heart attack Father    Cancer Sister    Colon cancer Neg Hx    Colon polyps  Neg Hx    Esophageal cancer Neg Hx    Stomach cancer Neg Hx    Rectal cancer Neg Hx     Past Surgical History:  Procedure Laterality Date   A/V FISTULAGRAM N/A 12/13/2019   Procedure: A/V FISTULAGRAM - Left Arm;  Surgeon: Serafina Mitchell, MD;  Location: [place] [facility];  Service: Cardiovascular;  Laterality: N/A;   AV FISTULA PLACEMENT Left 09/16/2019   Procedure: ARTERIOVENOUS (AV) FISTULA CREATION;  Surgeon: Elam Dutch, MD;  Location: [place];  Service: Vascular;  Laterality: Left;   IR FLUORO GUIDE CV LINE RIGHT  09/14/2019   IR US GUIDE VASC ACCESS RIGHT  09/14/2019   LIGATION OF COMPETING BRANCHES OF ARTERIOVENOUS FISTULA Left 12/13/2019   Procedure: BRANCH LIGATION OF LEFT ARTERIOVENOUS FISTULA;  Surgeon: Angelia Mould, MD;  Location: [place];  Service: Vascular;  Laterality: Left;    ROS: Review of Systems Negative except as stated above  PHYSICAL EXAM: BP 105/68   Pulse 81   Resp 16   Ht '5\' 6"'$  (1.676 m)   Wt 180 lb (81.6 kg)   SpO2 97%   BMI 29.05 kg/m   Physical Exam   General appearance - alert, well appearing, and in no distress Mental status - normal mood, behavior, speech, dress, motor activity, and thought processes Neck - supple, no significant adenopathy Chest - clear to auscultation, no wheezes, rales or rhonchi, symmetric air entry Heart - normal rate, regular rhythm, normal S1, S2, no murmurs, rubs, clicks or gallops Extremities - peripheral pulses normal, no pedal edema, no clubbing or cyanosis.  Dialysis graft in the left forearm with good thrill. Skin -large mass on the left frontal scalp and smaller 1 in the right temporal scalp.  CMP Latest Ref Rng & Units 12/13/2019 10/06/2019 09/17/2019  Glucose 70 - 99 mg/dL 92 85 159(H)  BUN 6 - 20 mg/dL 43(H) 24 86(H)  Creatinine 0.61 - 1.24 mg/dL 11.30(H) 7.59(H) 14.53(H)  Sodium 135 - 145 mmol/L 140 141 135  Potassium 3.5 - 5.1 mmol/L 4.4 3.8 4.1  Chloride 98 - 111 mmol/L 97(L) 97 97(L)  CO2  20 - 29 mmol/L - 26 22  Calcium 8.7 - 10.2 mg/dL - 9.0 6.9(L)  Total Protein 6.5 - 8.1 g/dL - - -  Total Bilirubin 0.3 - 1.2 mg/dL - - -  Alkaline Phos 38 - 126 U/L - - -  AST 15 - 41  U/L - - -  ALT 0 - 44 U/L - - -   Lipid Panel     Component Value Date/Time   CHOL 163 09/06/2018 1213   TRIG 141 09/06/2018 1213   HDL 32 (L) 09/06/2018 1213   CHOLHDL 5.1 (H) 09/06/2018 1213   CHOLHDL 4.6 08/04/2016 0340   VLDL 34 08/04/2016 0340   LDLCALC 103 (H) 09/06/2018 1213    CBC    Component Value Date/Time   WBC 5.7 10/06/2019 1154   WBC 6.5 09/17/2019 0824   RBC 4.34 10/06/2019 1154   RBC 3.81 (L) 09/17/2019 0824   HGB 15.3 12/13/2019 0559   HGB 10.6 (L) 10/06/2019 1154   HCT 45.0 12/13/2019 0559   HCT 35.1 (L) 10/06/2019 1154   PLT 332 10/06/2019 1154   MCV 81 10/06/2019 1154   MCH 24.4 (L) 10/06/2019 1154   MCH 24.4 (L) 09/17/2019 0824   MCHC 30.2 (L) 10/06/2019 1154   MCHC 30.9 09/17/2019 0824   RDW 14.8 10/06/2019 1154   LYMPHSABS 0.4 (L) 09/13/2019 0738   MONOABS 0.4 09/13/2019 0738   EOSABS 0.2 09/13/2019 0738   BASOSABS 0.1 09/13/2019 0738    ASSESSMENT AND PLAN: 1. Essential hypertension At goal.  He will continue his current medications and follow his schedule of not taking medications on his dialysis days. Near syncope episode that he had a month ago was most likely due to the low blood pressure.  2. ESRD on hemodialysis [facility]) Patient will continue going to dialysis 3 days a week.  He gets his blood test drawn there.  3. Scalp mass Keep follow-up appointment with the surgeon.  Patient states that the plan is to have these removed  4. Screening for colon cancer Told patient that his insurance may not pay for him to have a colonoscopy this year given that he had a negative Cologuard test less than 3 years ago.  However he states he needs to have the colonoscopy to get on the transplant list with [place] him to speak with his insurance to  make sure that it would be covered before he has it.  If they do not cover it at this time, he may need to wait until April of next year when he will be due again for colon cancer screening to have his colonoscopy. - Ambulatory referral to Gastroenterology  5. Need for shingles vaccine Patient agreeable to receiving first Shingrix shot.  Advised that the vaccine can cause swelling and redness at the injection site for several days.   Patient was given the opportunity to ask questions.  Patient verbalized understanding of the plan and was able to repeat key elements of the plan.   No orders of the defined types were placed in this encounter.    Requested Prescriptions    No prescriptions requested or ordered in this encounter    No follow-ups on file.  Karle Plumber, MD, FACP

## 2021-06-10 MED FILL — Sodium Zirconium Cyclosilicate For Susp Packet 10 GM: ORAL | 30 days supply | Qty: 30 | Fill #2 | Status: AC

## 2021-06-11 ENCOUNTER — Other Ambulatory Visit: Payer: Self-pay

## 2021-06-12 ENCOUNTER — Other Ambulatory Visit: Payer: Self-pay

## 2021-06-13 ENCOUNTER — Other Ambulatory Visit: Payer: Self-pay

## 2021-06-25 ENCOUNTER — Ambulatory Visit: Payer: No Typology Code available for payment source | Admitting: Internal Medicine

## 2021-06-26 ENCOUNTER — Other Ambulatory Visit: Payer: Self-pay

## 2021-07-11 ENCOUNTER — Other Ambulatory Visit: Payer: Self-pay

## 2021-07-11 MED ORDER — TRAMADOL HCL 50 MG PO TABS
ORAL_TABLET | ORAL | 0 refills | Status: AC
  Filled 2021-07-11: qty 20, 6d supply, fill #0

## 2021-07-12 ENCOUNTER — Other Ambulatory Visit: Payer: Self-pay

## 2021-08-17 MED FILL — Sodium Zirconium Cyclosilicate For Susp Packet 10 GM: ORAL | 30 days supply | Qty: 30 | Fill #3 | Status: AC

## 2021-08-19 ENCOUNTER — Other Ambulatory Visit: Payer: Self-pay

## 2021-08-20 ENCOUNTER — Other Ambulatory Visit: Payer: Self-pay

## 2021-09-10 ENCOUNTER — Encounter: Payer: Self-pay | Admitting: Gastroenterology

## 2021-09-16 ENCOUNTER — Other Ambulatory Visit: Payer: Self-pay

## 2021-09-17 ENCOUNTER — Telehealth: Payer: Self-pay | Admitting: *Deleted

## 2021-09-17 NOTE — Telephone Encounter (Signed)
Patient returned call. Scheduled OV 11/10 with Anderson Malta

## 2021-09-17 NOTE — Telephone Encounter (Signed)
Pt has a complicated medical hx.  According to his anesthesia record from 2020, he is an ASA IV.  Attempted to reach pt to make him an OV instead of a PV.  LMOM to call office back

## 2021-10-03 ENCOUNTER — Telehealth: Payer: Self-pay

## 2021-10-03 ENCOUNTER — Ambulatory Visit (INDEPENDENT_AMBULATORY_CARE_PROVIDER_SITE_OTHER): Payer: Medicare Other | Admitting: Physician Assistant

## 2021-10-03 ENCOUNTER — Encounter: Payer: Self-pay | Admitting: Physician Assistant

## 2021-10-03 VITALS — BP 100/64 | HR 76 | Ht 67.0 in | Wt 182.6 lb

## 2021-10-03 DIAGNOSIS — Z1211 Encounter for screening for malignant neoplasm of colon: Secondary | ICD-10-CM

## 2021-10-03 DIAGNOSIS — N186 End stage renal disease: Secondary | ICD-10-CM

## 2021-10-03 DIAGNOSIS — Z992 Dependence on renal dialysis: Secondary | ICD-10-CM

## 2021-10-03 DIAGNOSIS — Z01818 Encounter for other preprocedural examination: Secondary | ICD-10-CM | POA: Diagnosis not present

## 2021-10-03 MED ORDER — NA SULFATE-K SULFATE-MG SULF 17.5-3.13-1.6 GM/177ML PO SOLN: 1.0000 | Freq: Once | ORAL | 0 refills | Status: AC

## 2021-10-03 NOTE — Telephone Encounter (Signed)
I have reviewed the chart.    I agree with the Advanced Practitioner's note, impression, and recommendations with any updates as below. Recommend CMP/CBC 1 week before colonoscopy on a day after having had dialysis.   Justice Britain, MD

## 2021-10-03 NOTE — Progress Notes (Signed)
Attending Physician's Attestation   I have reviewed the chart.   I agree with the Advanced Practitioner's note, impression, and recommendations with any updates as below. Recommend CMP/CBC 1 week before colonoscopy on a day after having had dialysis.    Justice Britain, MD [facility] Advanced Endoscopy Office # [phone]

## 2021-10-03 NOTE — Patient Instructions (Signed)
You have been scheduled for a colonoscopy. Please follow written instructions given to you at your visit today.  Please pick up your prep supplies at the pharmacy within the next 1-3 days. If you use inhalers (even only as needed), please bring them with you on the day of your procedure.  If you are age 57 or older, your body mass index should be between 23-30. Your Body mass index is 28.6 kg/m. If this is out of the aforementioned range listed, please consider follow up with your Primary Care Provider.  If you are age 28 or younger, your body mass index should be between 19-25. Your Body mass index is 28.6 kg/m. If this is out of the aformentioned range listed, please consider follow up with your Primary Care Provider.   ________________________________________________________  The [facility] providers would like to encourage you to use [facility] to communicate with providers for non-urgent requests or questions.  Due to long hold times on the telephone, sending your provider a message by [facility] may be a faster and more efficient way to get a response.  Please allow 48 business hours for a response.  Please remember that this is for non-urgent requests.  _______________________________________________________

## 2021-10-03 NOTE — Telephone Encounter (Signed)
-----   Message from [place], [place] sent at 10/03/2021  1:27 PM EST ----- Regarding: can you arrange labs Can you arrange labs per recs from Dr. Jerilynn Mages?  Thanks-JLL ----- Message ----- From: Irving Copas., MD Sent: 10/03/2021   1:15 PM EST To: Levin Erp, PA     ----- Message ----- From: Levin Erp, [place] Sent: 10/03/2021   9:26 AM EST To: Irving Copas., MD

## 2021-10-03 NOTE — Progress Notes (Signed)
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    April 2020 Cologuard negative.    [facility]/2021 [facility].  It was recommended he have a colonoscopy prior to this.    Today, the patient tells me that he is doing well.  He has dialysis Monday Wednesday Fridays and is feeling quite healthy and strong.  He wants to get on the kidney transplant list but was told that he needed a colonoscopy first.  Tells me he has no breathing problems and no heart problems.  He is maintained on Aspirin daily.    Does describe some increase in stress recently in his life as his daughter and her 2 children moved in with him during her separation.    Denies fever, chills, weight loss, blood in his stool, change in bowel habits or abdominal pain.  Past Medical History:  Diagnosis Date   Chronic kidney disease M-W-F   hemodialysis    Hypertension    TIA (transient ischemic attack) 2013,2015    Past Surgical History:  Procedure Laterality Date   A/V FISTULAGRAM N/A 12/13/2019   Procedure: A/V FISTULAGRAM - Left Arm;  Surgeon: Serafina Mitchell, MD;  Location: [place] [facility];  Service: Cardiovascular;  Laterality: N/A;   AV FISTULA PLACEMENT Left 09/16/2019   Procedure: ARTERIOVENOUS (AV) FISTULA CREATION;  Surgeon: Elam Dutch, MD;  Location: [place] [facility];  Service: Vascular;  Laterality: Left;   IR FLUORO GUIDE CV LINE RIGHT  09/14/2019   IR US GUIDE VASC ACCESS RIGHT  09/14/2019   LIGATION OF COMPETING BRANCHES OF ARTERIOVENOUS FISTULA Left 12/13/2019   Procedure: BRANCH LIGATION OF LEFT ARTERIOVENOUS FISTULA;  Surgeon: Angelia Mould, MD;  Location: [facility];  Service:  Vascular;  Laterality: Left;    Current Outpatient Medications  Medication Sig Dispense Refill   amLODipine (NORVASC) 10 MG tablet TAKE 1 TABLET (10 MG TOTAL) BY MOUTH DAILY. 30 tablet 0   aspirin 81 MG tablet Take 1 tablet (81 mg total) by mouth daily. 30 tablet 0   atorvastatin (LIPITOR) 20 MG tablet TAKE 1 TABLET (20 MG TOTAL) BY MOUTH DAILY. 30 tablet 3   AURYXIA 1 GM 210 MG(Fe) tablet Take 210 mg by mouth 4 (four) times daily.     labetalol (NORMODYNE) 100 MG tablet Take 100 mg by mouth 2 (two) times daily.     labetalol (NORMODYNE) 100 MG tablet TAKE 1 TABLET BY MOUTH 2 TIMES DAILY 180 tablet 11   Multiple Vitamin (DAILY VITE) TABS Take 1 tablet by mouth daily.     sevelamer (RENAGEL) 800 MG tablet Take by mouth.     sodium zirconium cyclosilicate (LOKELMA) 10 g PACK packet TAKE 1 PACKET BY MOUTH AS DIRECTED TAKE ONCE DAILY ON NON-DIALYSIS DAYS 50 each 3   traMADol (ULTRAM) 50 MG tablet Take 1 tablet (50 mg total) by mouth every 8 (eight) hours as needed for up to 7 days for Moderate pain (4-6). 20 tablet 0   No current facility-administered medications for this visit.    Allergies as of 10/03/2021   (No Known Allergies)    Family History  Problem Relation Age of Onset   Stroke Mother  Hypertension Mother    Heart attack Father    Cancer Sister    Colon cancer Neg Hx    Colon polyps Neg Hx    Esophageal cancer Neg Hx    Stomach cancer Neg Hx    Rectal cancer Neg Hx     Social History   Socioeconomic History   Marital status: Divorced    Spouse name: Not on file   Number of children: Not on file   Years of education: Not on file   Highest education level: Not on file  Occupational History   Not on file  Tobacco Use   Smoking status: Former    Types: Cigarettes    Quit date: 07/02/2016    Years since quitting: 5.2   Smokeless tobacco: Never  Vaping Use   Vaping Use: Never used  Substance and Sexual Activity   Alcohol use: No   Drug use: No   Sexual  activity: Not on file  Other Topics Concern   Not on file  Social History Narrative   Not on file   Social Determinants of Health   Financial Resource Strain: Not on file  Food Insecurity: Not on file  Transportation Needs: Not on file  Physical Activity: Not on file  Stress: Not on file  Social Connections: Not on file  Intimate Partner Violence: Not on file    Review of Systems:    Constitutional: No weight loss, fever or chills Skin: No rash  Cardiovascular: No chest pain Respiratory: No SOB Gastrointestinal: See HPI and otherwise negative Genitourinary: No dysuria Neurological: No headache, dizziness or syncope Musculoskeletal: No new muscle or joint pain Hematologic: No bleeding or bruising Psychiatric: No history of depression or anxiety   Physical Exam:  Vital signs: BP 100/64   Pulse 76   Ht 5\' 7"  (1.702 m)   Wt 182 lb 9.6 oz (82.8 kg)   BMI 28.60 kg/m    Constitutional:   Pleasant Caucasian male appears to be in NAD, Well developed, Well nourished, alert and cooperative Head:  Normocephalic and atraumatic. Eyes:   PEERL, EOMI. No icterus. Conjunctiva pink. Ears:  Normal auditory acuity. Neck:  Supple Throat: Oral cavity and pharynx without inflammation, swelling or lesion.  Respiratory: Respirations even and unlabored. Lungs clear to auscultation bilaterally.   No wheezes, crackles, or rhonchi.  Cardiovascular: Normal S1, S2. No MRG. Regular rate and rhythm. No peripheral edema, cyanosis or pallor.  Gastrointestinal:  Soft, nondistended, nontender. No rebound or guarding. Normal bowel sounds. No appreciable masses or hepatomegaly. Rectal:  Not performed.  Msk:  Symmetrical without gross deformities. Without edema, no deformity or joint abnormality.  Neurologic:  Alert and  oriented x4;  grossly normal neurologically.  Skin:   Dry and intact without significant lesions or rashes. Psychiatric:  Demonstrates good judgement and reason without abnormal affect or  behaviors.  Requesting recent labs.  Assessment: 1.  Screening for colorectal cancer: Patient is required to have a colonoscopy prior to being placed on the kidney transplant list 2.  ESRD on HD MWF  Plan: 1.  We are requesting patient's most recent labs from his dialysis center.  He tells me these were drawn on 10/02/2021.  Ideally would like to have an updated CMP/CBC prior to time of procedures to ensure his electrolytes are within range.  Explained that if we cannot get these labs he may need to come in to have some drawn. 2.  Patient was scheduled with Dr. Rush Landmark due to timeframe/availability.  This was scheduled in the [place].  Did provide the patient a detailed list of risks for the procedure and he agrees to proceed. 3.  Patient to follow in clinic per recommendations after colonoscopy above.  He will continue to follow with Dr. Rush Landmark his primary GI physician after time of procedures.  Ellouise Newer, PA-C [facility] 10/03/2021, 8:41 AM  Cc: Ladell Pier, MD

## 2021-10-03 NOTE — Telephone Encounter (Signed)
Lm on vm for patient to return call to discuss recommendations.   Lab reminder in epic for patient to come for labs on 10/29/21. He has dialysis MWF.

## 2021-10-07 NOTE — Telephone Encounter (Signed)
Lm on mobile vm for patient to return call °

## 2021-10-08 ENCOUNTER — Encounter: Payer: Self-pay | Admitting: Internal Medicine

## 2021-10-08 ENCOUNTER — Other Ambulatory Visit: Payer: Self-pay

## 2021-10-08 ENCOUNTER — Ambulatory Visit: Payer: Medicare Other | Attending: Internal Medicine | Admitting: Internal Medicine

## 2021-10-08 VITALS — BP 110/74 | HR 71 | Resp 16 | Wt 184.8 lb

## 2021-10-08 DIAGNOSIS — Z992 Dependence on renal dialysis: Secondary | ICD-10-CM

## 2021-10-08 DIAGNOSIS — N186 End stage renal disease: Secondary | ICD-10-CM | POA: Diagnosis not present

## 2021-10-08 DIAGNOSIS — I10 Essential (primary) hypertension: Secondary | ICD-10-CM | POA: Diagnosis not present

## 2021-10-08 DIAGNOSIS — Z23 Encounter for immunization: Secondary | ICD-10-CM | POA: Diagnosis not present

## 2021-10-08 DIAGNOSIS — E7800 Pure hypercholesterolemia, unspecified: Secondary | ICD-10-CM

## 2021-10-08 MED ORDER — ZOSTER VAC RECOMB ADJUVANTED 50 MCG/0.5ML IM SUSR: 0.5000 mL | Freq: Once | INTRAMUSCULAR | 0 refills | Status: AC

## 2021-10-08 NOTE — Telephone Encounter (Signed)
Pt returned call, he is aware that he will need to come in for labs on 10/29/21. He is aware that no appt is necessary. He has been advised of lab location and hours. Pt verbalized understanding and had no concerns at the end of the call.

## 2021-10-08 NOTE — Progress Notes (Signed)
Patient ID: Fred Harrison, male    DOB: August 26, 1966  MRN: [id number]  CC: Hypertension   Subjective: Fred Harrison is a 55 y.o. male who presents for chronic ds management His concerns today include:  Pt with hx of HTN, TIA, ESRD on HD, former tob.   Since last visit with me, patient has had the scalp masses removed.  He is quite pleased with the results.  HTN/ESRD: Compliant with going to his dialysis 3 days a week.  For him to be put on the transplant list at [facility], he needed to have a colonoscopy.  I referred him on last visit.  Colonoscopy is scheduled for next month. -He is supposed to be on isosorbide and labetalol for blood pressure.  He tells me he has been out of the labetalol for a little over a month.  However blood pressure has been good without it.  On dialysis days he does not take blood pressure medications because blood pressure tends to be low.  Denies any chest pains, shortness of breath or lower extremity edema at this time. He continues to take and tolerate atorvastatin. HM: He is due for second shingles vaccine.  Patient Active Problem List   Diagnosis Date Noted   Pure hypercholesterolemia 06/07/2020   ESRD on hemodialysis ([place]) 10/06/2019   Microcytic anemia 10/06/2019   Acute on chronic renal failure ([place]) 09/13/2019   Scalp mass 12/01/2017   Hypokalemia    TIA (transient ischemic attack) 08/03/2016   HTN (hypertension) 08/03/2016     Current Outpatient Medications on File Prior to Visit  Medication Sig Dispense Refill   aspirin 81 MG tablet Take 1 tablet (81 mg total) by mouth daily. 30 tablet 0   atorvastatin (LIPITOR) 20 MG tablet TAKE 1 TABLET (20 MG TOTAL) BY MOUTH DAILY. 30 tablet 3   diphenhydrAMINE (BENADRYL) 50 MG tablet Take 50 mg by mouth as needed for itching.     heparin 1000 unit/ml SOLN injection Inject into the peritoneum as needed.     isosorbide dinitrate (ISORDIL) 30 MG tablet Take 30 mg by mouth daily.     labetalol  (NORMODYNE) 100 MG tablet Take 100 mg by mouth 2 (two) times daily.     methylPREDNISolone sodium succinate in sodium chloride 0.9 % 50 mL Inject into the vein once.     Multiple Vitamin (DAILY VITE) TABS Take 1 tablet by mouth daily.     sevelamer (RENAGEL) 800 MG tablet Take by mouth.     sodium zirconium cyclosilicate (LOKELMA) 10 g PACK packet TAKE 1 PACKET BY MOUTH AS DIRECTED TAKE ONCE DAILY ON NON-DIALYSIS DAYS 50 each 3   traMADol (ULTRAM) 50 MG tablet Take 1 tablet (50 mg total) by mouth every 8 (eight) hours as needed for up to 7 days for Moderate pain (4-6). 20 tablet 0   Vitamin D, Ergocalciferol, (DRISDOL) 1.25 MG (50000 UNIT) CAPS capsule Take 50,000 Units by mouth every 7 (seven) days.     [DISCONTINUED] isosorbide mononitrate (IMDUR) 30 MG 24 hr tablet TAKE 1 TABLET (30 MG TOTAL) BY MOUTH DAILY. 90 tablet 0   No current facility-administered medications on file prior to visit.    No Known Allergies  Social History   Socioeconomic History   Marital status: Divorced    Spouse name: Not on file   Number of children: Not on file   Years of education: Not on file   Highest education level: Not on file  Occupational History   Not  on file  Tobacco Use   Smoking status: Former    Types: Cigarettes    Quit date: 07/02/2016    Years since quitting: 5.2   Smokeless tobacco: Never  Vaping Use   Vaping Use: Never used  Substance and Sexual Activity   Alcohol use: No   Drug use: No   Sexual activity: Not on file  Other Topics Concern   Not on file  Social History Narrative   Not on file   Social Determinants of Health   Financial Resource Strain: Not on file  Food Insecurity: Not on file  Transportation Needs: Not on file  Physical Activity: Not on file  Stress: Not on file  Social Connections: Not on file  Intimate Partner Violence: Not on file    Family History  Problem Relation Age of Onset   Stroke Mother    Hypertension Mother    Heart attack Father     Cancer Sister    Colon cancer Neg Hx    Colon polyps Neg Hx    Esophageal cancer Neg Hx    Stomach cancer Neg Hx    Rectal cancer Neg Hx     Past Surgical History:  Procedure Laterality Date   A/V FISTULAGRAM N/A 12/13/2019   Procedure: A/V FISTULAGRAM - Left Arm;  Surgeon: Serafina Mitchell, MD;  Location: [place] [facility];  Service: Cardiovascular;  Laterality: N/A;   AV FISTULA PLACEMENT Left 09/16/2019   Procedure: ARTERIOVENOUS (AV) FISTULA CREATION;  Surgeon: Elam Dutch, MD;  Location: [place];  Service: Vascular;  Laterality: Left;   IR FLUORO GUIDE CV LINE RIGHT  09/14/2019   IR US GUIDE VASC ACCESS RIGHT  09/14/2019   LIGATION OF COMPETING BRANCHES OF ARTERIOVENOUS FISTULA Left 12/13/2019   Procedure: BRANCH LIGATION OF LEFT ARTERIOVENOUS FISTULA;  Surgeon: Angelia Mould, MD;  Location: [facility];  Service: Vascular;  Laterality: Left;    ROS: Review of Systems Negative except as stated above  PHYSICAL EXAM: BP 110/74   Pulse 71   Resp 16   Wt 184 lb 12.8 oz (83.8 kg)   SpO2 99%   BMI 28.94 kg/m   Physical Exam   General appearance - alert, well appearing, and in no distress Mental status - normal mood, behavior, speech, dress, motor activity, and thought processes Neck - supple, no significant adenopathy Chest - clear to auscultation, no wheezes, rales or rhonchi, symmetric air entry Heart - normal rate, regular rhythm, normal S1, S2, no murmurs, rubs, clicks or gallops Extremities - peripheral pulses normal, no pedal edema, no clubbing or cyanosis  CMP Latest Ref Rng & Units 12/13/2019 10/06/2019 09/17/2019  Glucose 70 - 99 mg/dL 92 85 159(H)  BUN 6 - 20 mg/dL 43(H) 24 86(H)  Creatinine 0.61 - 1.24 mg/dL 11.30(H) 7.59(H) 14.53(H)  Sodium 135 - 145 mmol/L 140 141 135  Potassium 3.5 - 5.1 mmol/L 4.4 3.8 4.1  Chloride 98 - 111 mmol/L 97(L) 97 97(L)  CO2 20 - 29 mmol/L - 26 22  Calcium 8.7 - 10.2 mg/dL - 9.0 6.9(L)  Total Protein 6.5 - 8.1 g/dL - - -   Total Bilirubin 0.3 - 1.2 mg/dL - - -  Alkaline Phos 38 - 126 U/L - - -  AST 15 - 41 U/L - - -  ALT 0 - 44 U/L - - -   Lipid Panel     Component Value Date/Time   CHOL 163 09/06/2018 1213   TRIG 141 09/06/2018 1213  HDL 32 (L) 09/06/2018 1213   CHOLHDL 5.1 (H) 09/06/2018 1213   CHOLHDL 4.6 08/04/2016 0340   VLDL 34 08/04/2016 0340   LDLCALC 103 (H) 09/06/2018 1213    CBC    Component Value Date/Time   WBC 5.7 10/06/2019 1154   WBC 6.5 09/17/2019 0824   RBC 4.34 10/06/2019 1154   RBC 3.81 (L) 09/17/2019 0824   HGB 15.3 12/13/2019 0559   HGB 10.6 (L) 10/06/2019 1154   HCT 45.0 12/13/2019 0559   HCT 35.1 (L) 10/06/2019 1154   PLT 332 10/06/2019 1154   MCV 81 10/06/2019 1154   MCH 24.4 (L) 10/06/2019 1154   MCH 24.4 (L) 09/17/2019 0824   MCHC 30.2 (L) 10/06/2019 1154   MCHC 30.9 09/17/2019 0824   RDW 14.8 10/06/2019 1154   LYMPHSABS 0.4 (L) 09/13/2019 0738   MONOABS 0.4 09/13/2019 0738   EOSABS 0.2 09/13/2019 0738   BASOSABS 0.1 09/13/2019 0738    ASSESSMENT AND PLAN: 1. Essential hypertension Blood pressure at goal.  We will discontinue the isosorbide since he has been off of it for a month and blood pressure is still good.  He will continue labetalol.  2. ESRD on hemodialysis ([place]) Continue going to dialysis 3 days a week.  He will keep upcoming appointment for colonoscopy so that he can be placed on the transplant list.  3. Pure hypercholesterolemia Continue atorvastatin.  4. Need for shingles vaccine Prescription given to allow him to get his shingles vaccine at any outside pharmacy.  I request that whichever pharmacy he uses, he tells them to send me a note to let me know that he has received it.    Patient was given the opportunity to ask questions.  Patient verbalized understanding of the plan and was able to repeat key elements of the plan.   No orders of the defined types were placed in this encounter.    Requested Prescriptions    No  prescriptions requested or ordered in this encounter    No follow-ups on file.  Karle Plumber, MD, FACP

## 2021-10-29 ENCOUNTER — Telehealth: Payer: Self-pay

## 2021-10-29 ENCOUNTER — Other Ambulatory Visit (INDEPENDENT_AMBULATORY_CARE_PROVIDER_SITE_OTHER): Payer: Medicare Other

## 2021-10-29 ENCOUNTER — Encounter: Payer: Medicare Other | Admitting: Gastroenterology

## 2021-10-29 ENCOUNTER — Other Ambulatory Visit: Payer: Self-pay

## 2021-10-29 DIAGNOSIS — N186 End stage renal disease: Secondary | ICD-10-CM | POA: Diagnosis not present

## 2021-10-29 DIAGNOSIS — Z1211 Encounter for screening for malignant neoplasm of colon: Secondary | ICD-10-CM

## 2021-10-29 DIAGNOSIS — Z992 Dependence on renal dialysis: Secondary | ICD-10-CM

## 2021-10-29 DIAGNOSIS — Z1159 Encounter for screening for other viral diseases: Secondary | ICD-10-CM | POA: Diagnosis not present

## 2021-10-29 LAB — CBC
HCT: 43.2 % (ref 39.0–52.0)
Hemoglobin: 13.5 g/dL (ref 13.0–17.0)
MCHC: 31.3 g/dL (ref 30.0–36.0)
MCV: 76.7 fl — ABNORMAL LOW (ref 78.0–100.0)
Platelets: 436 10*3/uL — ABNORMAL HIGH (ref 150.0–400.0)
RBC: 5.63 Mil/uL (ref 4.22–5.81)
RDW: 16.2 % — ABNORMAL HIGH (ref 11.5–15.5)
WBC: 7.8 10*3/uL (ref 4.0–10.5)

## 2021-10-29 LAB — COMPREHENSIVE METABOLIC PANEL
ALT: 20 U/L (ref 0–53)
AST: 20 U/L (ref 0–37)
Albumin: 4.6 g/dL (ref 3.5–5.2)
Alkaline Phosphatase: 62 U/L (ref 39–117)
BUN: 45 mg/dL — ABNORMAL HIGH (ref 6–23)
CO2: 29 mEq/L (ref 19–32)
Calcium: 9.3 mg/dL (ref 8.4–10.5)
Chloride: 97 mEq/L (ref 96–112)
Creatinine, Ser: 10.72 mg/dL (ref 0.40–1.50)
GFR: 4.84 mL/min — CL (ref >60.00)
Glucose, Bld: 102 mg/dL — ABNORMAL HIGH (ref 70–99)
Potassium: 4 mEq/L (ref 3.5–5.1)
Sodium: 140 mEq/L (ref 135–145)
Total Bilirubin: 0.5 mg/dL (ref 0.2–1.2)
Total Protein: 8 g/dL (ref 6.0–8.3)

## 2021-10-29 NOTE — Telephone Encounter (Signed)
Patient arrived today for labs.

## 2021-10-29 NOTE — Telephone Encounter (Signed)
-----   Message from Yevette Edwards, RN sent at 10/03/2021  1:31 PM EST ----- Regarding: Labs CBC, CMET on 10/29/21, need to enter orders

## 2021-10-29 NOTE — Progress Notes (Signed)
Lab order entered per Dr Rush Landmark recommendations.

## 2021-11-05 ENCOUNTER — Other Ambulatory Visit: Payer: Self-pay

## 2021-11-05 ENCOUNTER — Encounter: Payer: Self-pay | Admitting: Gastroenterology

## 2021-11-05 ENCOUNTER — Ambulatory Visit (AMBULATORY_SURGERY_CENTER): Payer: Medicare Other | Admitting: Gastroenterology

## 2021-11-05 VITALS — BP 188/71 | HR 78 | Temp 98.7°F | Resp 17 | Ht 67.0 in | Wt 182.0 lb

## 2021-11-05 DIAGNOSIS — K5900 Constipation, unspecified: Secondary | ICD-10-CM

## 2021-11-05 DIAGNOSIS — Z1211 Encounter for screening for malignant neoplasm of colon: Secondary | ICD-10-CM

## 2021-11-05 MED ORDER — SODIUM CHLORIDE 0.9 % IV SOLN: 500.0000 mL | Freq: Once | INTRAVENOUS | Status: DC

## 2021-11-05 NOTE — Patient Instructions (Addendum)
Impression/Recommendations:  Hemorrhoid and high fiber diet handouts given to patient.  Use FiberCon 1-2 tablet by mouth daily. Miralax 1-2 times daily. Start Colace 2000 mg. Daily.  Drink as much water/fluid as you are allowed to by Nephrology.    Continue present medications. Consider other laxative therapy if needed in future.  Await pathology results.  Repeat colonoscopy in 6- 12 months to check healing.    YOU HAD AN ENDOSCOPIC PROCEDURE TODAY AT [facility]:   Refer to the procedure report that was given to you for any specific questions about what was found during the examination.  If the procedure report does not answer your questions, please call your gastroenterologist to clarify.  If you requested that your care partner not be given the details of your procedure findings, then the procedure report has been included in a sealed envelope for you to review at your convenience later.  YOU SHOULD EXPECT: Some feelings of bloating in the abdomen. Passage of more gas than usual.  Walking can help get rid of the air that was put into your GI tract during the procedure and reduce the bloating. If you had a lower endoscopy (such as a colonoscopy or flexible sigmoidoscopy) you may notice spotting of blood in your stool or on the toilet paper. If you underwent a bowel prep for your procedure, you may not have a normal bowel movement for a few days.  Please Note:  You might notice some irritation and congestion in your nose or some drainage.  This is from the oxygen used during your procedure.  There is no need for concern and it should clear up in a day or so.  SYMPTOMS TO REPORT IMMEDIATELY:  Following lower endoscopy (colonoscopy or flexible sigmoidoscopy):  Excessive amounts of blood in the stool  Significant tenderness or worsening of abdominal pains  Swelling of the abdomen that is new, acute  Fever of 100F or higher For urgent or emergent issues, a  gastroenterologist can be reached at any hour by calling [phone]. Do not use MyChart messaging for urgent concerns.    DIET:  We do recommend a small meal at first, but then you may proceed to your regular diet.  Drink plenty of fluids but you should avoid alcoholic beverages for 24 hours.  ACTIVITY:  You should plan to take it easy for the rest of today and you should NOT DRIVE or use heavy machinery until tomorrow (because of the sedation medicines used during the test).    FOLLOW UP: Our staff will call the number listed on your records 48-72 hours following your procedure to check on you and address any questions or concerns that you may have regarding the information given to you following your procedure. If we do not reach you, we will leave a message.  We will attempt to reach you two times.  During this call, we will ask if you have developed any symptoms of COVID 19. If you develop any symptoms (ie: fever, flu-like symptoms, shortness of breath, cough etc.) before then, please call [phone].  If you test positive for Covid 19 in the 2 weeks post procedure, please call and report this information to [place].    If any biopsies were taken you will be contacted by phone or by letter within the next 1-3 weeks.  Please call us at [phone] if you have not heard about the biopsies in 3 weeks.    SIGNATURES/CONFIDENTIALITY: You and/or your care partner have  signed paperwork which will be entered into your electronic medical record.  These signatures attest to the fact that that the information above on your After Visit Summary has been reviewed and is understood.  Full responsibility of the confidentiality of this discharge information lies with you and/or your care-partner.

## 2021-11-05 NOTE — Progress Notes (Signed)
Pt. Requested private conversation with Dr. Rush Landmark prior to his son coming back to the PACU.  When son called back to PACU, Dr. Rush Landmark reviewed procedure findings, and plans for moving forward with pt.'s care.  Hence delay in discharge from [facility].

## 2021-11-05 NOTE — Progress Notes (Signed)
Report to PACU, RN, vss, BBS= Clear.  

## 2021-11-05 NOTE — Progress Notes (Signed)
Patient arrived for colonoscopy,pt. Received heparin during dialysis on Monday 11/04/21,charge nurse went to inform doctor of patient having dialysis and heparin on 11/04/21  verbal order received to proceed with procedure.  CHECK-IN-AER  V/S-DT

## 2021-11-05 NOTE — Progress Notes (Signed)
GASTROENTEROLOGY PROCEDURE H&P NOTE   Primary Care Physician: Ladell Pier, MD  HPI: Fred Harrison is a 55 y.o. male who presents for Colonoscopy for screening.  Past Medical History:  Diagnosis Date   Chronic kidney disease M-W-F   hemodialysis    Hypertension    TIA (transient ischemic attack) 2013,2015   Past Surgical History:  Procedure Laterality Date   A/V FISTULAGRAM N/A 12/13/2019   Procedure: A/V FISTULAGRAM - Left Arm;  Surgeon: Serafina Mitchell, MD;  Location: [place] [facility];  Service: Cardiovascular;  Laterality: N/A;   AV FISTULA PLACEMENT Left 09/16/2019   Procedure: ARTERIOVENOUS (AV) FISTULA CREATION;  Surgeon: Elam Dutch, MD;  Location: [place] [facility];  Service: Vascular;  Laterality: Left;   IR FLUORO GUIDE CV LINE RIGHT  09/14/2019   IR US GUIDE VASC ACCESS RIGHT  09/14/2019   LIGATION OF COMPETING BRANCHES OF ARTERIOVENOUS FISTULA Left 12/13/2019   Procedure: BRANCH LIGATION OF LEFT ARTERIOVENOUS FISTULA;  Surgeon: Angelia Mould, MD;  Location: [facility] OR;  Service: Vascular;  Laterality: Left;   Current Outpatient Medications  Medication Sig Dispense Refill   aspirin 81 MG tablet Take 1 tablet (81 mg total) by mouth daily. 30 tablet 0   atorvastatin (LIPITOR) 20 MG tablet TAKE 1 TABLET (20 MG TOTAL) BY MOUTH DAILY. 30 tablet 3   diphenhydrAMINE (BENADRYL) 50 MG tablet Take 50 mg by mouth as needed for itching.     heparin 1000 unit/ml SOLN injection Inject into the peritoneum as needed.     labetalol (NORMODYNE) 100 MG tablet Take 100 mg by mouth 2 (two) times daily.     methylPREDNISolone sodium succinate in sodium chloride 0.9 % 50 mL Inject into the vein once.     Multiple Vitamin (DAILY VITE) TABS Take 1 tablet by mouth daily.     sevelamer (RENAGEL) 800 MG tablet Take by mouth.     sodium zirconium cyclosilicate (LOKELMA) 10 g PACK packet TAKE 1 PACKET BY MOUTH AS DIRECTED TAKE ONCE DAILY ON NON-DIALYSIS DAYS 50 each 3   traMADol (ULTRAM)  50 MG tablet Take 1 tablet (50 mg total) by mouth every 8 (eight) hours as needed for up to 7 days for Moderate pain (4-6). 20 tablet 0   Vitamin D, Ergocalciferol, (DRISDOL) 1.25 MG (50000 UNIT) CAPS capsule Take 50,000 Units by mouth every 7 (seven) days.     No current facility-administered medications for this visit.    Current Outpatient Medications:    aspirin 81 MG tablet, Take 1 tablet (81 mg total) by mouth daily., Disp: 30 tablet, Rfl: 0   atorvastatin (LIPITOR) 20 MG tablet, TAKE 1 TABLET (20 MG TOTAL) BY MOUTH DAILY., Disp: 30 tablet, Rfl: 3   diphenhydrAMINE (BENADRYL) 50 MG tablet, Take 50 mg by mouth as needed for itching., Disp: , Rfl:    heparin 1000 unit/ml SOLN injection, Inject into the peritoneum as needed., Disp: , Rfl:    labetalol (NORMODYNE) 100 MG tablet, Take 100 mg by mouth 2 (two) times daily., Disp: , Rfl:    methylPREDNISolone sodium succinate in sodium chloride 0.9 % 50 mL, Inject into the vein once., Disp: , Rfl:    Multiple Vitamin (DAILY VITE) TABS, Take 1 tablet by mouth daily., Disp: , Rfl:    sevelamer (RENAGEL) 800 MG tablet, Take by mouth., Disp: , Rfl:    sodium zirconium cyclosilicate (LOKELMA) 10 g PACK packet, TAKE 1 PACKET BY MOUTH AS DIRECTED TAKE ONCE DAILY ON NON-DIALYSIS DAYS,  Disp: 50 each, Rfl: 3   traMADol (ULTRAM) 50 MG tablet, Take 1 tablet (50 mg total) by mouth every 8 (eight) hours as needed for up to 7 days for Moderate pain (4-6)., Disp: 20 tablet, Rfl: 0   Vitamin D, Ergocalciferol, (DRISDOL) 1.25 MG (50000 UNIT) CAPS capsule, Take 50,000 Units by mouth every 7 (seven) days., Disp: , Rfl:  No Known Allergies Family History  Problem Relation Age of Onset   Stroke Mother    Hypertension Mother    Heart attack Father    Cancer Sister    Colon cancer Neg Hx    Colon polyps Neg Hx    Esophageal cancer Neg Hx    Stomach cancer Neg Hx    Rectal cancer Neg Hx    Social History   Socioeconomic History   Marital status: Divorced     Spouse name: Not on file   Number of children: Not on file   Years of education: Not on file   Highest education level: Not on file  Occupational History   Not on file  Tobacco Use   Smoking status: Former    Types: Cigarettes    Quit date: 07/02/2016    Years since quitting: 5.3   Smokeless tobacco: Never  Vaping Use   Vaping Use: Never used  Substance and Sexual Activity   Alcohol use: No   Drug use: No   Sexual activity: Not on file  Other Topics Concern   Not on file  Social History Narrative   Not on file   Social Determinants of Health   Financial Resource Strain: Not on file  Food Insecurity: Not on file  Transportation Needs: Not on file  Physical Activity: Not on file  Stress: Not on file  Social Connections: Not on file  Intimate Partner Violence: Not on file    Physical Exam: There were no vitals filed for this visit. There is no height or weight on file to calculate BMI. GEN: NAD EYE: Sclerae anicteric ENT: MMM CV: Non-tachycardic GI: Soft, NT/ND NEURO:  Alert & Oriented x 3  Lab Results: No results for input(s): WBC, HGB, HCT, PLT in the last 72 hours. BMET No results for input(s): NA, K, CL, CO2, GLUCOSE, BUN, CREATININE, CALCIUM in the last 72 hours. LFT No results for input(s): PROT, ALBUMIN, AST, ALT, ALKPHOS, BILITOT, BILIDIR, IBILI in the last 72 hours. PT/INR No results for input(s): LABPROT, INR in the last 72 hours.   Impression / Plan: This is a 56 y.o.male who presents for Colonoscopy for screening.  The risks and benefits of endoscopic evaluation/treatment were discussed with the patient and/or family; these include but are not limited to the risk of perforation, infection, bleeding, missed lesions, lack of diagnosis, severe illness requiring hospitalization, as well as anesthesia and sedation related illnesses.  The patient's history has been reviewed, patient examined, no change in status, and deemed stable for procedure.  The patient  and/or family is agreeable to proceed.    Justice Britain, MD [facility] Advanced [facility]oscopy Office # [phone]

## 2021-11-05 NOTE — Op Note (Addendum)
[facility] Patient Name: Fred Harrison Procedure Date: 11/05/2021 11:23 AM MRN: [id number] Endoscopist: Justice Britain , MD Age: 55 Referring MD:  Date of Birth: [birth year] Gender: Male Account #: [id number] Procedure:                Colonoscopy Indications:              Screening for colorectal malignant neoplasm, This                            is the patient's first colonoscopy, Incidental                            constipation noted Medicines:                Monitored Anesthesia Care Procedure:                Pre-Anesthesia Assessment:                           - Prior to the procedure, a History and Physical                            was performed, and patient medications and                            allergies were reviewed. The patient's tolerance of                            previous anesthesia was also reviewed. The risks                            and benefits of the procedure and the sedation                            options and risks were discussed with the patient.                            All questions were answered, and informed consent                            was obtained. Prior Anticoagulants: The patient has                            taken no previous anticoagulant or antiplatelet                            agents except for aspirin. ASA Grade Assessment:                            III - A patient with severe systemic disease. After                            reviewing the risks and benefits, the patient was  deemed in satisfactory condition to undergo the                            procedure.                           After obtaining informed consent, the colonoscope                            was passed under direct vision. Throughout the                            procedure, the patient's blood pressure, pulse, and                            oxygen saturations were monitored continuously. The                             Olympus CF-HQ190L ([id number]) Colonoscope was                            introduced through the anus and advanced to the 5                            cm into the ileum. The colonoscopy was performed                            without difficulty. The patient tolerated the                            procedure. The quality of the bowel preparation was                            fair. The terminal ileum, ileocecal valve,                            appendiceal orifice, and rectum were photographed. Scope In: 11:31:59 AM Scope Out: 11:57:41 AM Scope Withdrawal Time: 0 hours 20 minutes 58 seconds  Total Procedure Duration: 0 hours 25 minutes 42 seconds  Findings:                 The digital rectal exam findings include                            hemorrhoids. Pertinent negatives include no                            palpable rectal lesions.                           Extensive amounts of semi-liquid stool was found in                            the entire colon, interfering with visualization.  Lavage of the area was performed using copious                            amounts, resulting in clearance with only fair                            visualization however.                           The terminal ileum and ileocecal valve appeared                            normal.                           Multiple ulcers were found in the mid rectum and in                            the distal rectum. The largest ulcer was 75%                            circumferential in the mid rectum. No bleeding was                            present. No stigmata of recent bleeding were seen.                            Biopsies were taken with a cold forceps for                            histology to rule out malignancy and viral                            processes.                           Normal mucosa was found in the rectum otherwise.                            Biopsies were  taken with a cold forceps for                            histology to rule out chronic proctitis.                           Non-bleeding non-thrombosed external and internal                            hemorrhoids were found during retroflexion, during                            perianal exam and during digital exam. The                            hemorrhoids were  Grade II (internal hemorrhoids                            that prolapse but reduce spontaneously).                           No large colon polyps/masses were noted on today's                            examination. Complications:            No immediate complications. Estimated Blood Loss:     Estimated blood loss was minimal. Impression:               - Stool in the entire examined colon.                           - Preparation of the colon was fair even after                            copious lavage throughout.                           - Hemorrhoids found on digital rectal exam.                           - The examined portion of the ileum was normal.                           - Multiple ulcers in the mid rectum and in the                            distal rectum. Biopsied. It is not clear if this is                            ischemic in nature, stercoral in nature,                            inflammatory in nature.                           - Normal mucosa in the rectum otherwise. Biopsied.                           - Non-bleeding non-thrombosed external and internal                            hemorrhoids.                           - No large colon polyps/masses were noted on                            today's examination. Recommendation:           - The patient will be observed post-procedure,  until all discharge criteria are met.                           - Discharge patient to home.                           - High fiber diet.                           - Start Colace 200 mg daily.                            - Use FiberCon 1 tablets PO daily.                           - Initiate Miralax 1 to 2 times daily.                           - Drink as much water/fluid as you are allowed by                            Nephrology. If 32 ounces are allowed please try to                            do this, so that it will help with the laxative                            therapy.                           - Continue present medications otherwise. Can                            consider other laxative therapy if needed in future.                           - Await pathology results.                           - Would normally recommend a repeat colonoscopy in                            6-12 months with double preparation to ensure no                            small polyps were missed. Due to the                            inflammation/ulceration as long as no premalignant                            or malignant changes are noted on the pathology, I  will recommend a 6-10-week colonoscopy follow up                            (patient will need to do a 2-day preparation). We                            will need to schedule our hemodialysis patients                            moving forward in the Hospital-based setting and so                            this will need to be done there. We could consider                            an earlier colonoscopy after a few weeks however.                           - The findings and recommendations were discussed                            with the patient.                           - The findings and recommendations were discussed                            with the patient's family. Justice Britain, MD 11/05/2021 12:13:49 PM

## 2021-11-05 NOTE — Progress Notes (Signed)
Called to room to assist during endoscopic procedure.  Patient ID and intended procedure confirmed with present staff. Received instructions for my participation in the procedure from the performing physician.  

## 2021-11-06 LAB — SURGICAL PATHOLOGY

## 2021-11-07 ENCOUNTER — Telehealth: Payer: Self-pay

## 2021-11-07 ENCOUNTER — Encounter: Payer: Self-pay | Admitting: Gastroenterology

## 2021-11-07 NOTE — Telephone Encounter (Signed)
°  Follow up Call-  Call back number 11/05/2021  Post procedure Call Back phone  # [phone]  Permission to leave phone message Yes  Some recent data might be hidden     Patient questions:  Do you have a fever, pain , or abdominal swelling? No. Pain Score  0 *  Have you tolerated food without any problems? Yes.    Have you been able to return to your normal activities? Yes.    Do you have any questions about your discharge instructions: Diet   No. Medications  No. Follow up visit  No.  Do you have questions or concerns about your Care? No.  Actions: * If pain score is 4 or above: No action needed, pain <4.  Have you developed a fever since your procedure? no  2.   Have you had an respiratory symptoms (SOB or cough) since your procedure? no  3.   Have you tested positive for COVID 19 since your procedure no  4.   Have you had any family members/close contacts diagnosed with the COVID 19 since your procedure?  no   If yes to any of these questions please route to Joylene John, RN and Joella Prince, RN

## 2021-11-11 ENCOUNTER — Other Ambulatory Visit: Payer: Self-pay

## 2021-11-11 DIAGNOSIS — Z1211 Encounter for screening for malignant neoplasm of colon: Secondary | ICD-10-CM

## 2021-11-11 MED ORDER — PEG 3350-KCL-NA BICARB-NACL 420 G PO SOLR
4000.0000 mL | Freq: Once | ORAL | 0 refills | Status: AC
  Filled 2021-11-11: qty 4000, 1d supply, fill #0

## 2021-11-20 ENCOUNTER — Other Ambulatory Visit: Payer: Self-pay

## 2022-01-10 ENCOUNTER — Encounter (HOSPITAL_COMMUNITY): Payer: Self-pay | Admitting: Gastroenterology

## 2022-01-10 NOTE — Progress Notes (Signed)
Attempted to obtain medical history via telephone, unable to reach at this time. I left a voicemail to return pre surgical testing department's phone call.  

## 2022-01-14 ENCOUNTER — Telehealth: Payer: Self-pay | Admitting: Gastroenterology

## 2022-01-14 MED ORDER — PEG 3350-KCL-NA BICARB-NACL 420 G PO SOLR: 4000.0000 mL | Freq: Once | ORAL | 0 refills | Status: AC

## 2022-01-14 NOTE — Telephone Encounter (Signed)
Prescription for Golytely sent to [facility].

## 2022-01-14 NOTE — Telephone Encounter (Signed)
Inbound call from patient states he need prescription for Golytely sent to [facility] on [address] for upcoming hospital procedure

## 2022-01-20 ENCOUNTER — Ambulatory Visit (HOSPITAL_COMMUNITY): Payer: Medicare Other | Admitting: Anesthesiology

## 2022-01-20 ENCOUNTER — Encounter (HOSPITAL_COMMUNITY): Payer: Self-pay | Admitting: Gastroenterology

## 2022-01-20 ENCOUNTER — Ambulatory Visit (HOSPITAL_BASED_OUTPATIENT_CLINIC_OR_DEPARTMENT_OTHER): Payer: Medicare Other | Admitting: Anesthesiology

## 2022-01-20 ENCOUNTER — Other Ambulatory Visit: Payer: Self-pay

## 2022-01-20 ENCOUNTER — Encounter (HOSPITAL_COMMUNITY)
Admission: RE | Disposition: A | Discharge status: Home / Self Care | Payer: Self-pay | Source: Home / Self Care | Attending: Gastroenterology

## 2022-01-20 ENCOUNTER — Ambulatory Visit (HOSPITAL_COMMUNITY)
Admission: RE | Admit: 2022-01-20 | Discharge: 2022-01-20 | Disposition: A | Discharge status: Home / Self Care | Payer: Medicare Other | Attending: Gastroenterology | Admitting: Gastroenterology

## 2022-01-20 DIAGNOSIS — Z992 Dependence on renal dialysis: Secondary | ICD-10-CM | POA: Diagnosis not present

## 2022-01-20 DIAGNOSIS — I120 Hypertensive chronic kidney disease with stage 5 chronic kidney disease or end stage renal disease: Secondary | ICD-10-CM | POA: Insufficient documentation

## 2022-01-20 DIAGNOSIS — N189 Chronic kidney disease, unspecified: Secondary | ICD-10-CM

## 2022-01-20 DIAGNOSIS — Z1211 Encounter for screening for malignant neoplasm of colon: Secondary | ICD-10-CM

## 2022-01-20 DIAGNOSIS — K5730 Diverticulosis of large intestine without perforation or abscess without bleeding: Secondary | ICD-10-CM | POA: Insufficient documentation

## 2022-01-20 DIAGNOSIS — N186 End stage renal disease: Secondary | ICD-10-CM | POA: Insufficient documentation

## 2022-01-20 DIAGNOSIS — K641 Second degree hemorrhoids: Secondary | ICD-10-CM | POA: Diagnosis not present

## 2022-01-20 DIAGNOSIS — Q438 Other specified congenital malformations of intestine: Secondary | ICD-10-CM | POA: Insufficient documentation

## 2022-01-20 DIAGNOSIS — Z87891 Personal history of nicotine dependence: Secondary | ICD-10-CM | POA: Diagnosis not present

## 2022-01-20 HISTORY — PX: COLONOSCOPY WITH PROPOFOL: SHX5780

## 2022-01-20 LAB — POCT I-STAT, CHEM 8
BUN: 65 mg/dL — ABNORMAL HIGH (ref 6–20)
Calcium, Ion: 1.11 mmol/L — ABNORMAL LOW (ref 1.15–1.40)
Chloride: 103 mmol/L (ref 98–111)
Creatinine, Ser: >18 mg/dL — ABNORMAL HIGH (ref 0.61–1.24)
Glucose, Bld: 87 mg/dL (ref 70–99)
HCT: 45 % (ref 39.0–52.0)
Hemoglobin: 15.3 g/dL (ref 13.0–17.0)
Potassium: 4.6 mmol/L (ref 3.5–5.1)
Sodium: 138 mmol/L (ref 135–145)
TCO2: 23 mmol/L (ref 22–32)

## 2022-01-20 SURGERY — COLONOSCOPY WITH PROPOFOL
Anesthesia: Monitor Anesthesia Care

## 2022-01-20 MED ORDER — PHENYLEPHRINE HCL (PRESSORS) 10 MG/ML IV SOLN
INTRAVENOUS | Status: DC | PRN
  Administered 2022-01-20: 80 ug via INTRAVENOUS
  Administered 2022-01-20 (×2): 120 ug via INTRAVENOUS
  Administered 2022-01-20: 80 ug via INTRAVENOUS

## 2022-01-20 MED ORDER — LIDOCAINE HCL 1 % IJ SOLN
INTRAMUSCULAR | Status: DC | PRN
  Administered 2022-01-20: 40 mg via INTRADERMAL

## 2022-01-20 MED ORDER — PROPOFOL 500 MG/50ML IV EMUL
INTRAVENOUS | Status: DC | PRN
  Administered 2022-01-20: 125 ug/kg/min via INTRAVENOUS

## 2022-01-20 MED ORDER — PROPOFOL 1000 MG/100ML IV EMUL
INTRAVENOUS | Status: AC
  Filled 2022-01-20: qty 100

## 2022-01-20 MED ORDER — SODIUM CHLORIDE 0.9 % IV SOLN: INTRAVENOUS | Status: DC

## 2022-01-20 MED ORDER — PROPOFOL 10 MG/ML IV BOLUS
INTRAVENOUS | Status: DC | PRN
  Administered 2022-01-20 (×2): 10 mg via INTRAVENOUS
  Administered 2022-01-20: 20 mg via INTRAVENOUS

## 2022-01-20 SURGICAL SUPPLY — 22 items

## 2022-01-20 NOTE — Anesthesia Postprocedure Evaluation (Signed)
Anesthesia Post Note  Patient: Fred Harrison  Procedure(s) Performed: COLONOSCOPY WITH PROPOFOL     Patient location during evaluation: Endoscopy Anesthesia Type: MAC Level of consciousness: awake Pain management: pain level controlled Vital Signs Assessment: post-procedure vital signs reviewed and stable Cardiovascular status: stable Postop Assessment: no apparent nausea or vomiting Anesthetic complications: no   No notable events documented.  Last Vitals:  Vitals:   01/20/22 1120 01/20/22 1121  BP: 120/74 120/74  Pulse: 76 76  Resp: 12 17  Temp:    SpO2: 99% 99%    Last Pain:  Vitals:   01/20/22 1120  TempSrc:   PainSc: 0-No pain                 Huston Foley

## 2022-01-20 NOTE — H&P (Signed)
GASTROENTEROLOGY PROCEDURE H&P NOTE   Primary Care Physician: Ladell Pier, MD  HPI: Fred Harrison is a 56 y.o. male who presents for Colonoscopy for Colonoscopy for screening with previous inadequate preparation and with rectal ulcers noted.  Past Medical History:  Diagnosis Date   Chronic kidney disease M-W-F   hemodialysis    Hypertension    TIA (transient ischemic attack) 2013,2015   Past Surgical History:  Procedure Laterality Date   A/V FISTULAGRAM N/A 12/13/2019   Procedure: A/V FISTULAGRAM - Left Arm;  Surgeon: Serafina Mitchell, MD;  Location: [facility];  Service: Cardiovascular;  Laterality: N/A;   AV FISTULA PLACEMENT Left 09/16/2019   Procedure: ARTERIOVENOUS (AV) FISTULA CREATION;  Surgeon: Elam Dutch, MD;  Location: [facility] OR;  Service: Vascular;  Laterality: Left;   CYST REMOVED     FROM HEAD   IR FLUORO GUIDE CV LINE RIGHT  09/14/2019   IR US GUIDE VASC ACCESS RIGHT  09/14/2019   LIGATION OF COMPETING BRANCHES OF ARTERIOVENOUS FISTULA Left 12/13/2019   Procedure: BRANCH LIGATION OF LEFT ARTERIOVENOUS FISTULA;  Surgeon: Angelia Mould, MD;  Location: [facility] OR;  Service: Vascular;  Laterality: Left;   Current Facility-Administered Medications  Medication Dose Route Frequency Provider Last Rate Last Admin   0.9 %  sodium chloride infusion   Intravenous Continuous Mansouraty, Telford Nab., MD        Current Facility-Administered Medications:    0.9 %  sodium chloride infusion, , Intravenous, Continuous, Mansouraty, Telford Nab., MD No Known Allergies Family History  Problem Relation Age of Onset   Stroke Mother    Hypertension Mother    Heart attack Father    Cancer Sister    Colon cancer Neg Hx    Colon polyps Neg Hx    Esophageal cancer Neg Hx    Stomach cancer Neg Hx    Rectal cancer Neg Hx    Social History   Socioeconomic History   Marital status: Divorced    Spouse name: Not on file   Number of children: Not on file    Years of education: Not on file   Highest education level: Not on file  Occupational History   Not on file  Tobacco Use   Smoking status: Former    Types: Cigarettes    Quit date: 07/02/2016    Years since quitting: 5.5   Smokeless tobacco: Never  Vaping Use   Vaping Use: Never used  Substance and Sexual Activity   Alcohol use: No   Drug use: No   Sexual activity: Not on file  Other Topics Concern   Not on file  Social History Narrative   Not on file   Social Determinants of Health   Financial Resource Strain: Not on file  Food Insecurity: Not on file  Transportation Needs: Not on file  Physical Activity: Not on file  Stress: Not on file  Social Connections: Not on file  Intimate Partner Violence: Not on file    Physical Exam: Today's Vitals   01/20/22 0856  BP: (!) 133/92  Pulse: 79  Resp: 15  Temp: 98.2 F (36.8 C)  TempSrc: Oral  SpO2: 99%  Weight: 80.3 kg  Height: 5\' 7"  (1.702 m)  PainSc: 0-No pain   Body mass index is 27.72 kg/m. GEN: NAD EYE: Sclerae anicteric ENT: MMM CV: Non-tachycardic GI: Soft, NT/ND NEURO:  Alert & Oriented x 3  Lab Results: Recent Labs    01/20/22 0857  HGB 15.3  HCT 45.0   BMET Recent Labs    01/20/22 0857  NA 138  K 4.6  CL 103  GLUCOSE 87  BUN 65*  CREATININE >18.00*   LFT No results for input(s): PROT, ALBUMIN, AST, ALT, ALKPHOS, BILITOT, BILIDIR, IBILI in the last 72 hours. PT/INR No results for input(s): LABPROT, INR in the last 72 hours.   Impression / Plan: This is a 56 y.o.male who presents for Colonoscopy for Colonoscopy for screening with previous inadequate preparation and with rectal ulcers noted.  The risks and benefits of endoscopic evaluation/treatment were discussed with the patient and/or family; these include but are not limited to the risk of perforation, infection, bleeding, missed lesions, lack of diagnosis, severe illness requiring hospitalization, as well as anesthesia and sedation  related illnesses.  The patient's history has been reviewed, patient examined, no change in status, and deemed stable for procedure.  The patient and/or family is agreeable to proceed.    Fred Britain, MD [facility] Advanced [facility]oscopy Office # [phone]

## 2022-01-20 NOTE — Op Note (Signed)
[facility] Patient Name: Fred Harrison Procedure Date: 01/20/2022 MRN: [id number] Attending MD: Justice Britain , MD Date of Birth: [birth year] CSN: [id number] Age: 56 Admit Type: Outpatient Procedure:                Colonoscopy Indications:              Screening for colorectal malignant neoplasm Providers:                Justice Britain, MD, Jeanella Cara, RN,                            Tyna Jaksch Technician Referring MD:             Ladell Pier Medicines:                Monitored Anesthesia Care Complications:            No immediate complications. Estimated Blood Loss:     Estimated blood loss: none. Procedure:                Pre-Anesthesia Assessment:                           - Prior to the procedure, a History and Physical                            was performed, and patient medications and                            allergies were reviewed. The patient's tolerance of                            previous anesthesia was also reviewed. The risks                            and benefits of the procedure and the sedation                            options and risks were discussed with the patient.                            All questions were answered, and informed consent                            was obtained. Prior Anticoagulants: The patient has                            taken no previous anticoagulant or antiplatelet                            agents except for aspirin. ASA Grade Assessment:                            III - A patient with severe systemic disease. After  reviewing the risks and benefits, the patient was                            deemed in satisfactory condition to undergo the                            procedure.                           After obtaining informed consent, the colonoscope                            was passed under direct vision. Throughout the                             procedure, the patient's blood pressure, pulse, and                            oxygen saturations were monitored continuously. The                            CF-HQ190L ([id number]) Olympus colonoscope was                            introduced through the anus and advanced to the the                            cecum, identified by appendiceal orifice and                            ileocecal valve. The colonoscopy was performed                            without difficulty. The patient tolerated the                            procedure. The quality of the bowel preparation was                            good. The ileocecal valve, appendiceal orifice, and                            rectum were photographed. Scope In: 10:34:45 AM Scope Out: 10:50:08 AM Scope Withdrawal Time: 0 hours 8 minutes 24 seconds  Total Procedure Duration: 0 hours 15 minutes 23 seconds  Findings:      The digital rectal exam findings include hemorrhoids. Pertinent       negatives include no palpable rectal lesions.      The left colon was moderately tortuous.      A few small-mouthed diverticula were found in the recto-sigmoid colon.      Normal mucosa was found in the entire colon otherwise.      Non-bleeding non-thrombosed internal hemorrhoids were found during       retroflexion, during perianal exam and during digital exam. The       hemorrhoids were Grade  II (internal hemorrhoids that prolapse but reduce       spontaneously). Impression:               - Hemorrhoids found on digital rectal exam.                           - Tortuous colon.                           - Diverticulosis in the recto-sigmoid colon.                           - Normal mucosa in the entire examined colon                            othewise.                           - Non-bleeding non-thrombosed internal hemorrhoids. Moderate Sedation:      Not Applicable - Patient had care per Anesthesia. Recommendation:           - The patient will be  observed post-procedure,                            until all discharge criteria are met.                           - Discharge patient to home.                           - Patient has a contact number available for                            emergencies. The signs and symptoms of potential                            delayed complications were discussed with the                            patient. Return to normal activities tomorrow.                            Written discharge instructions were provided to the                            patient.                           - High fiber diet.                           - Use FiberCon 1-2 tablets PO daily.                           - Continue present medications.                           -  Repeat colonoscopy in 10 years for screening                            purposes.                           - The findings and recommendations were discussed                            with the patient.                           - The findings and recommendations were discussed                            with the patient's family. Procedure Code(s):        --- Professional ---                           [phone], Colonoscopy, flexible; diagnostic, including                            collection of specimen(s) by brushing or washing,                            when performed (separate procedure) Diagnosis Code(s):        --- Professional ---                           Z12.11, Encounter for screening for malignant                            neoplasm of colon                           K64.1, Second degree hemorrhoids                           K57.30, Diverticulosis of large intestine without                            perforation or abscess without bleeding                           Q43.8, Other specified congenital malformations of                            intestine CPT copyright 2019 [employer]. All rights reserved. The codes documented  in this report are preliminary and upon coder review may  be revised to meet current compliance requirements. Justice Britain, MD 01/20/2022 11:06:08 AM Number of Addenda: 0

## 2022-01-20 NOTE — Transfer of Care (Signed)
Immediate Anesthesia Transfer of Care Note  Patient: Fred Harrison  Procedure(s) Performed: COLONOSCOPY WITH PROPOFOL  Patient Location: PACU  Anesthesia Type:MAC  Level of Consciousness: awake  Airway & Oxygen Therapy: Patient Spontanous Breathing and Patient connected to face mask oxygen  Post-op Assessment: Report given to RN and Post -op Vital signs reviewed and stable  Post vital signs: Reviewed and stable  Last Vitals:  Vitals Value Taken Time  BP 107/70 01/20/22 1058  Temp    Pulse 74 01/20/22 1059  Resp 15 01/20/22 1059  SpO2 100 % 01/20/22 1059  Vitals shown include unvalidated device data.  Last Pain:  Vitals:   01/20/22 0856  TempSrc: Oral  PainSc: 0-No pain         Complications: No notable events documented.

## 2022-01-20 NOTE — Anesthesia Preprocedure Evaluation (Signed)
Anesthesia Evaluation  Patient identified by MRN, date of birth, ID band Patient awake    Reviewed: Allergy & Precautions, NPO status , Patient's Chart, lab work & pertinent test results  Airway Mallampati: II  TM Distance: >3 FB Neck ROM: Full    Dental  (+) Poor Dentition   Pulmonary former smoker,    Pulmonary exam normal breath sounds clear to auscultation       Cardiovascular hypertension, Pt. on home beta blockers Normal cardiovascular exam Rhythm:Regular Rate:Normal     Neuro/Psych TIAnegative psych ROS   GI/Hepatic negative GI ROS, Neg liver ROS,   Endo/Other  negative endocrine ROS  Renal/GU ESRF and DialysisRenal disease  negative genitourinary   Musculoskeletal negative musculoskeletal ROS (+)   Abdominal   Peds  Hematology   Anesthesia Other Findings  IMPRESSIONS    1. Left ventricular ejection fraction, by visual estimation, is 55 to  60%. The left ventricle has normal function. Normal left ventricular size.  There is borderline left ventricular hypertrophy.  2. Global right ventricle has normal systolic function.The right  ventricular size is normal. No increase in right ventricular wall  thickness.  3. Left atrial size was moderately dilated.  4. Right atrial size was moderately dilated.  5. The mitral valve is normal in structure. Trace mitral valve  regurgitation. No evidence of mitral stenosis.  6. The tricuspid valve is normal in structure. Tricuspid valve  regurgitation is trivial.  7. The aortic valve is tricuspid Aortic valve regurgitation was not  visualized by color flow Doppler. Mild aortic valve sclerosis without  stenosis.  8. The pulmonic valve was grossly normal. Pulmonic valve regurgitation is  not visualized by color flow Doppler.  9. The inferior vena cava is dilated in size with >50% respiratory  variability, suggesting right atrial pressure of 8 mmHg.   FINDINGS   Left Ventricle: Left ventricular ejection fraction, by visual estimation,  is 55 to 60%. The left ventricle has normal function. There is borderline  left ventricular hypertrophy. Concentric left ventricular hypertrophy.  Normal left ventricular size.   Right Ventricle: The right ventricular size is normal. No increase in  right ventricular wall thickness. Global RV systolic function is has  normal systolic function.   Left Atrium: Left atrial size was moderately dilated.   Right Atrium: Right atrial size was moderately dilated   Pericardium: There is no evidence of pericardial effusion.   Mitral Valve: The mitral valve is normal in structure. No evidence of  mitral valve stenosis by observation. Trace mitral valve regurgitation.   Tricuspid Valve: The tricuspid valve is normal in structure. Tricuspid  valve regurgitation is trivial by color flow Doppler.   Aortic Valve: The aortic valve is tricuspid. Aortic valve regurgitation  was not visualized by color flow Doppler. Mild aortic valve sclerosis is  present, with no evidence of aortic valve stenosis.   Pulmonic Valve: The pulmonic valve was grossly normal. Pulmonic valve  regurgitation is not visualized by color flow Doppler.   Aorta: The aortic root, ascending aorta and aortic arch are all  structurally normal, with no evidence of dilitation or obstruction.   Pulmonary Artery: The pulmonary artery is of normal size and origin.   Venous: The inferior vena cava is dilated in size with greater than 50%  respiratory variability, suggesting right atrial pressure of 8 mmHg.   IAS/Shunts: No atrial level shunt detected by color flow Doppler. No  ventricular septal defect is seen or detected. There is no evidence of an  atrial septal defect.      LEFT VENTRICLE  PLAX 2D  LVIDd:     5.40 cm Diastology  LVIDs:     4.00 cm LV e' lateral:  7.51 cm/s  LV PW:     1.20 cm LV E/e' lateral: 14.4  LV IVS:     1.20 cm  LVOT diam:   2.20 cm  LV SV:     71 ml  LV SV Index:  34.97  LVOT Area:   3.80 cm     RIGHT VENTRICLE  RV S prime:   17.20 cm/s  TAPSE (M-mode): 2.5 cm   LEFT ATRIUM       Index    RIGHT ATRIUM      Index  LA diam:    4.20 cm 2.13 cm/m RA Area:   21.10 cm  LA Vol (A2C):  76.0 ml 38.53 ml/m RA Volume:  59.10 ml 29.96 ml/m  LA Vol (A4C):  99.3 ml 50.34 ml/m  LA Biplane Vol: 86.8 ml 44.00 ml/m  AORTIC VALVE  LVOT Vmax:  123.00 cm/s  LVOT Vmean: 89.500 cm/s  LVOT VTI:  0.238 m    AORTA  Ao Root diam: 2.90 cm   MITRAL VALVE  MV Area (PHT): 5.23 cm       SHUNTS  MV PHT:    42.05 msec      Systemic VTI: 0.24 m  MV Decel Time: 145 msec       Systemic Diam: 2.20 cm  MV E velocity: 108.00 cm/s 103 cm/s  MV A velocity: 104.00 cm/s 70.3 cm/s  MV E/A ratio: 1.04    1.5     Buford Dresser MD  Electronically signed by Buford Dresser MD  Signature Date/Time: 09/14/2019/6:28:49 PM      Final   Imaging Info  ECHOCARDIOGRAM COMPLETE (Order #[id number]) on 09/13/19  Study History  ECHOCARDIOGRAM COMPLETE (Order #[id number]) on 09/13/19  [employer] Images  Show images for ECHOCARDIOGRAM COMPLETE Images on [employer]  Show images for [name] Performing Technologist/Nurse  Performing Technologist/Nurse: Renda Rolls Reason for Exam Priority: Routine Not on file  Patient Data  Height  66.75 in   BP  124/82 mmHg      Surgical History  Surgical History   No past medical history on file.  Other Surgical History   Procedure Laterality Date Comment Source A/V FISTULAGRAM N/A 12/13/2019 Procedure: A/V FISTULAGRAM - Left Arm; Surgeon: Serafina Mitchell, MD; Location: [facility]; Service: Cardiovascular; Laterality: N/A;  AV FISTULA PLACEMENT Left 09/16/2019 Procedure: ARTERIOVENOUS (AV) FISTULA CREATION; Surgeon: Elam Dutch, MD; Location: [facility]; Service: Vascular; Laterality: Left;  CYST REMOVED   FROM HEAD  IR FLUORO GUIDE CV LINE RIGHT  09/14/2019   IR US GUIDE VASC ACCESS RIGHT  09/14/2019   LIGATION OF COMPETING BRANCHES OF ARTERIOVENOUS FISTULA Left 12/13/2019 Procedure: BRANCH LIGATION OF LEFT ARTERIOVENOUS FISTULA; Surgeon: Angelia Mould, MD; Location: [facility]; Service: Vascular; Laterality: Left;    Implants  No active implants to display in this view.  Encounter-Level Documents on 09/13/2019:  Scan on 09/19/2019 10:00 AM by Default, Provider, MD Document on 09/17/2019 4:28 PM by Valere Dross, RN: IP After Visit Summary Document on 09/17/2019 3:27 PM by Milderd Meager, RN: ED PB Billing Extract Document on 09/17/2019 2:47 PM by Valere Dross, RN: IP After Visit Summary Scan on 09/15/2019 1:11 PM by Default, Provider, MD Scan on 09/16/2019 9:47 PM by Default, Provider, MD Electronic  signature on 09/13/2019 8:44 AM - E-signed Scan on 09/13/2019 7:46 AM by Default, Provider, MD Scan on 09/13/2019 11:16 AM by Default, Provider, MD   Order-Level Documents on 09/13/2019:  Scan on 09/22/2019 10:50 AM by Default, Provider, MD Scan on 09/14/2019 6:30 PM by Default, Provider, MD Scan on 09/14/2019 3:52 PM by Default, Provider, MD Scan on 09/14/2019 1:18 PM by Emeline General A   Reproductive/Obstetrics                             Anesthesia Physical  Anesthesia Plan  ASA: III  Anesthesia Plan: MAC   Post-op Pain Management: Minimal or no pain anticipated   Induction: Intravenous  PONV Risk Score and Plan: 1 and Treatment may vary due to age or medical condition  Airway Management Planned: Natural Airway and Mask  Additional Equipment: None  Intra-op Plan:   Post-operative Plan:   Informed Consent: I have reviewed the patients History and Physical, chart, labs and discussed the procedure including the risks, benefits and  alternatives for the proposed anesthesia with the patient or authorized representative who has indicated his/her understanding and acceptance.     Dental advisory given  Plan Discussed with: CRNA  Anesthesia Plan Comments:         Anesthesia Quick Evaluation

## 2022-01-21 ENCOUNTER — Encounter (HOSPITAL_COMMUNITY): Payer: Self-pay | Admitting: Gastroenterology

## 2022-03-11 ENCOUNTER — Ambulatory Visit: Payer: Medicare Other | Admitting: Internal Medicine

## 2022-03-20 ENCOUNTER — Ambulatory Visit: Payer: Medicare Other | Admitting: Internal Medicine

## 2022-05-19 ENCOUNTER — Telehealth: Payer: Self-pay

## 2022-06-23 ENCOUNTER — Ambulatory Visit: Payer: Medicare Other | Admitting: Internal Medicine

## 2022-11-28 ENCOUNTER — Emergency Department (HOSPITAL_BASED_OUTPATIENT_CLINIC_OR_DEPARTMENT_OTHER)
Admit: 2022-11-28 | Discharge: 2022-11-28 | Disposition: A | Discharge status: Home / Self Care | Payer: Medicare PPO | Attending: Emergency Medicine | Admitting: Emergency Medicine

## 2022-11-28 ENCOUNTER — Other Ambulatory Visit: Payer: Self-pay

## 2022-11-28 ENCOUNTER — Emergency Department (HOSPITAL_COMMUNITY): Payer: Medicare PPO

## 2022-11-28 ENCOUNTER — Encounter (HOSPITAL_COMMUNITY): Payer: Self-pay

## 2022-11-28 ENCOUNTER — Emergency Department (HOSPITAL_COMMUNITY)
Admission: EM | Admit: 2022-11-28 | Discharge: 2022-11-28 | Disposition: A | Discharge status: Home / Self Care | Payer: Medicare PPO | Attending: Emergency Medicine | Admitting: Emergency Medicine

## 2022-11-28 DIAGNOSIS — Z8673 Personal history of transient ischemic attack (TIA), and cerebral infarction without residual deficits: Secondary | ICD-10-CM | POA: Diagnosis not present

## 2022-11-28 DIAGNOSIS — I10 Essential (primary) hypertension: Secondary | ICD-10-CM | POA: Diagnosis not present

## 2022-11-28 DIAGNOSIS — M19012 Primary osteoarthritis, left shoulder: Secondary | ICD-10-CM | POA: Diagnosis not present

## 2022-11-28 DIAGNOSIS — R109 Unspecified abdominal pain: Secondary | ICD-10-CM

## 2022-11-28 DIAGNOSIS — N390 Urinary tract infection, site not specified: Secondary | ICD-10-CM | POA: Insufficient documentation

## 2022-11-28 DIAGNOSIS — Z79899 Other long term (current) drug therapy: Secondary | ICD-10-CM | POA: Diagnosis not present

## 2022-11-28 DIAGNOSIS — I700 Atherosclerosis of aorta: Secondary | ICD-10-CM | POA: Diagnosis not present

## 2022-11-28 DIAGNOSIS — R208 Other disturbances of skin sensation: Secondary | ICD-10-CM

## 2022-11-28 DIAGNOSIS — R52 Pain, unspecified: Secondary | ICD-10-CM

## 2022-11-28 DIAGNOSIS — M25512 Pain in left shoulder: Secondary | ICD-10-CM | POA: Diagnosis not present

## 2022-11-28 DIAGNOSIS — Z7982 Long term (current) use of aspirin: Secondary | ICD-10-CM | POA: Diagnosis not present

## 2022-11-28 DIAGNOSIS — M25511 Pain in right shoulder: Secondary | ICD-10-CM | POA: Diagnosis not present

## 2022-11-28 DIAGNOSIS — R1031 Right lower quadrant pain: Secondary | ICD-10-CM | POA: Diagnosis not present

## 2022-11-28 LAB — CBC WITH DIFFERENTIAL/PLATELET
Abs Immature Granulocytes: 0.25 10*3/uL — ABNORMAL HIGH (ref 0.00–0.07)
Basophils Absolute: 0.1 10*3/uL (ref 0.0–0.1)
Basophils Relative: 2 %
Eosinophils Absolute: 0.2 10*3/uL (ref 0.0–0.5)
Eosinophils Relative: 6 %
HCT: 39.7 % (ref 39.0–52.0)
Hemoglobin: 11.7 g/dL — ABNORMAL LOW (ref 13.0–17.0)
Immature Granulocytes: 10 %
Lymphocytes Relative: 10 %
Lymphs Abs: 0.3 10*3/uL — ABNORMAL LOW (ref 0.7–4.0)
MCH: 23.5 pg — ABNORMAL LOW (ref 26.0–34.0)
MCHC: 29.5 g/dL — ABNORMAL LOW (ref 30.0–36.0)
MCV: 79.7 fL — ABNORMAL LOW (ref 80.0–100.0)
Monocytes Absolute: 0.2 10*3/uL (ref 0.1–1.0)
Monocytes Relative: 7 %
Neutro Abs: 1.7 10*3/uL (ref 1.7–7.7)
Neutrophils Relative %: 65 %
Platelets: 264 10*3/uL (ref 150–400)
RBC: 4.98 MIL/uL (ref 4.22–5.81)
RDW: 17.3 % — ABNORMAL HIGH (ref 11.5–15.5)
WBC: 2.6 10*3/uL — ABNORMAL LOW (ref 4.0–10.5)
nRBC: 0 % (ref 0.0–0.2)

## 2022-11-28 LAB — URINALYSIS, ROUTINE W REFLEX MICROSCOPIC
Bacteria, UA: NONE SEEN
Bilirubin Urine: NEGATIVE
Glucose, UA: NEGATIVE mg/dL
Ketones, ur: NEGATIVE mg/dL
Leukocytes,Ua: NEGATIVE
Nitrite: NEGATIVE
Protein, ur: 100 mg/dL — AB
Specific Gravity, Urine: 1.011 (ref 1.005–1.030)
pH: 7 (ref 5.0–8.0)

## 2022-11-28 LAB — COMPREHENSIVE METABOLIC PANEL
ALT: 18 U/L (ref 0–44)
AST: 18 U/L (ref 15–41)
Albumin: 4.3 g/dL (ref 3.5–5.0)
Alkaline Phosphatase: 40 U/L (ref 38–126)
Anion gap: 8 (ref 5–15)
BUN: 29 mg/dL — ABNORMAL HIGH (ref 6–20)
CO2: 23 mmol/L (ref 22–32)
Calcium: 8.7 mg/dL — ABNORMAL LOW (ref 8.9–10.3)
Chloride: 109 mmol/L (ref 98–111)
Creatinine, Ser: 2.23 mg/dL — ABNORMAL HIGH (ref 0.61–1.24)
GFR, Estimated: 33 mL/min — ABNORMAL LOW (ref >60)
Glucose, Bld: 126 mg/dL — ABNORMAL HIGH (ref 70–99)
Potassium: 4 mmol/L (ref 3.5–5.1)
Sodium: 140 mmol/L (ref 135–145)
Total Bilirubin: 0.8 mg/dL (ref 0.3–1.2)
Total Protein: 8 g/dL (ref 6.5–8.1)

## 2022-11-28 LAB — LACTIC ACID, PLASMA
Lactic Acid, Venous: 0.8 mmol/L (ref 0.5–1.9)
Lactic Acid, Venous: 0.8 mmol/L (ref 0.5–1.9)

## 2022-11-28 MED ORDER — CIPROFLOXACIN HCL 500 MG PO TABS: 500.0000 mg | ORAL_TABLET | Freq: Two times a day (BID) | ORAL | 0 refills | Status: AC

## 2022-11-28 MED ORDER — ACETAMINOPHEN 325 MG PO TABS
650.0000 mg | ORAL_TABLET | Freq: Four times a day (QID) | ORAL | Status: DC | PRN
  Administered 2022-11-28: 650 mg via ORAL
  Filled 2022-11-28: qty 2

## 2022-11-28 NOTE — ED Triage Notes (Signed)
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  Patient also c/o left shoulder pain that radiates down the left arm. Patient states his left hand  is colder. Patient also states around his left arm fistula site it feels "lumpier."

## 2022-11-28 NOTE — ED Provider Notes (Signed)
1:04 PM Care assumed from Dr. Jinny Sanders.  At time of transfer of care, we are waiting on discussion with the patient's transplant team at [facility] to discuss management.  Spoke to the transplant physician who recommended starting on Cipro for antibiotic coverage for suspected early UTI that did not show up on the urinalysis yet given the stranding.  They did not feel patient needed further workup or ultrasound and he is scheduled to get seen in clinic next week.  They would recommend antibiotics and the close follow-up with good return precautions.  They agreed with discharge and anticipate discharge after speaking with patient.  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  Clinical Impression: 1. Right flank pain   2. Acute pain of left shoulder   3. Urinary tract infection without hematuria, site unspecified     Disposition: Discharge  Condition: Good  I have discussed the results, Dx and Tx plan with the pt(& family if present). He/she/they expressed understanding and agree(s) with the plan. Discharge instructions discussed at great length. Strict return precautions discussed and pt &/or family have verbalized understanding of the instructions. No further questions at time of discharge.    New Prescriptions   CIPROFLOXACIN (CIPRO) 500 MG TABLET    Take 1 tablet (500 mg total) by mouth every 12 (twelve) hours for 7 days.    Follow Up: Ladell Pier, MD [address] [place] [address] [phone]        Donal Lynam, Gwenyth Allegra, MD 11/28/22 1520

## 2022-11-28 NOTE — Discharge Instructions (Addendum)
Your history, exam and workup today led Fred Harrison to get the urinalysis and CT scan for the abdominal pain.  The urinalysis did not show convincing evidence of infection however the CT did show some inflammation near the transplant kidney.  We spoke to your transplant team who wants Fred Harrison to give you prescription for Cipro and follow-up with him at your appointment.  Otherwise your x-ray was reassuring and your ultrasounds were negative on the arm.  We do feel you are safe for discharge home.  Please take the antibiotics and rest and stay hydrated and follow-up.  If any symptoms change or worsen, please turn to the nearest emergency room.

## 2022-11-28 NOTE — ED Provider Notes (Signed)
[facility] DEPT Provider Note   CSN: [id number] Arrival date & time: 11/28/22  0859     History  Chief Complaint  Patient presents with   Flank Pain   Shoulder Pain    Fred Harrison is a 57 y.o. male.  57 year old with PMH of right kidney transplant in April 2023 (on tacrolimus and myphortic), HTN, TIA, hx of CMV viremia here with left shoulder pain and right flank pain.  Patient complaining of left shoulder pain and right flank pain.  He believes that the left shoulder pain is what is worse and bothering him the most.  He says that it happens when he turns his neck to the left and has an anterior shoulder pain.  He denies any chest pain or shortness of breath.  Denies any cough.  For the last few days has been having this anterior shoulder pain and also has noticed that his fistula on the left arm has been more swollen than usual.  He noticed that it was more swollen today and yesterday it was irritating him somewhat but not swollen.  Denies any fevers or vomiting.  He does feel like his left hand was cooler than his right hand last night but feels like it is a little bit better now.  He is worried about a clot in his upper extremity.  He says the last time that he used the fistula site was in April before transplant.  Took 2 Tylenol yesterday for pain but denies any other pain medication use.   For his right flank pain and right lower quadrant pain and he says that he has noticed discomfort with bending or movement ever since he got to the transplant in April.  But for the last few days has been having some sharp pains in this area as with these movements.  He says that this happens when he stands up or moves the wrong way.  He denies any constipation or diarrhea.  He has been producing normal amount of urine and denies any dysuria, hematuria, frequency/urgency.  He denies any nausea or vomiting and has been eating and drinking normally.  When he takes a deep  breath then he feels some discomfort on the right upper abdomen.  He has an appointment with nephrology on the 11th but wanted to be checked out today.      The history is provided by the patient.  Flank Pain Associated symptoms include abdominal pain. Pertinent negatives include no shortness of breath.  Shoulder Pain Associated symptoms: no fever        Home Medications Prior to Admission medications   Medication Sig Start Date End Date Taking? Authorizing Provider  aspirin EC 81 MG tablet Take 81 mg by mouth in the morning. Swallow whole.    [provider]  atorvastatin (LIPITOR) 20 MG tablet TAKE 1 TABLET (20 MG TOTAL) BY MOUTH DAILY. 03/19/21 03/19/22  Ladell Pier, MD  b complex-vitamin c-folic acid (NEPHRO-VITE) 0.8 MG TABS tablet Take 1 tablet by mouth in the morning.    [provider]  heparin 1000 unit/ml SOLN injection Inject into the peritoneum as needed.    [provider]  labetalol (NORMODYNE) 100 MG tablet Take 100 mg by mouth See admin instructions. Take 1 tablet (100 mg) by mouth twice daily on Sundays, Tuesdays, Thursdays & Saturdays. Take 1 tablet (100 mg) by mouth once in the evening on Mondays, Wednesdays, & Fridays. 09/19/19   [provider]  methylPREDNISolone sodium  succinate in sodium chloride 0.9 % 50 mL Inject into the vein once.    [provider]  sevelamer (RENAGEL) 800 MG tablet Take 1,600-3,200 mg by mouth See admin instructions. Take 4 tablets (3200 mg) by mouth 3 times daily with each meal & take 2 tablet (1600 mg) by mouth with a snack twice daily 10/03/20   [provider]  traMADol (ULTRAM) 50 MG tablet Take 1 tablet (50 mg total) by mouth every 8 (eight) hours as needed for up to 7 days for Moderate pain (4-6). Patient not taking: Reported on 11/05/2021 07/11/21     isosorbide mononitrate (IMDUR) 30 MG 24 hr tablet TAKE 1 TABLET (30 MG TOTAL) BY MOUTH DAILY. 03/17/21 06/26/21  Ladell Pier,  MD      Allergies    Patient has no known allergies.    Review of Systems   Review of Systems  Constitutional:  Negative for activity change, appetite change and fever.  HENT:  Negative for sore throat.   Respiratory:  Negative for cough and shortness of breath.   Gastrointestinal:  Positive for abdominal pain. Negative for constipation, diarrhea, nausea and vomiting.  Genitourinary:  Positive for flank pain. Negative for decreased urine volume, difficulty urinating, dysuria, frequency, hematuria and urgency.  Musculoskeletal:  Positive for myalgias.  Skin:  Negative for rash.  Neurological: Negative.     Physical Exam Updated Vital Signs BP (!) 147/96   Pulse 83   Temp 98.4 F (36.9 C) (Oral)   Resp 19   Ht 5' 6.75" (1.695 m)   Wt 90.7 kg   SpO2 97%   BMI 31.56 kg/m  Physical Exam Vitals and nursing note reviewed.  Constitutional:      General: He is not in acute distress.    Appearance: He is well-developed.  HENT:     Head: Normocephalic and atraumatic.     Mouth/Throat:     Mouth: Mucous membranes are moist.  Eyes:     Conjunctiva/sclera: Conjunctivae normal.  Cardiovascular:     Rate and Rhythm: Normal rate and regular rhythm.     Pulses: Normal pulses.     Heart sounds: No murmur heard. Pulmonary:     Effort: Pulmonary effort is normal. No respiratory distress.     Breath sounds: Normal breath sounds.  Abdominal:     General: Abdomen is flat. There is no distension.     Palpations: Abdomen is soft.     Tenderness: There is abdominal tenderness. There is no right CVA tenderness, left CVA tenderness, guarding or rebound.     Comments: Mild RLQ tenderness  Musculoskeletal:        General: Tenderness present. No swelling.     Cervical back: Neck supple.     Comments: Tenderness over anterior shoulder. ROM intact  Skin:    General: Skin is warm and dry.     Capillary Refill: Capillary refill takes less than 2 seconds.  Neurological:     Mental Status: He  is alert.  Psychiatric:        Mood and Affect: Mood normal.     ED Results / Procedures / Treatments   Labs (all labs ordered are listed, but only abnormal results are displayed) Labs Reviewed  COMPREHENSIVE METABOLIC PANEL - Abnormal; Notable for the following components:      Result Value   Glucose, Bld 126 (*)    BUN 29 (*)    Creatinine, Ser 2.23 (*)    Calcium 8.7 (*)  GFR, Estimated 33 (*)    All other components within normal limits  CBC WITH DIFFERENTIAL/PLATELET - Abnormal; Notable for the following components:   WBC 2.6 (*)    Hemoglobin 11.7 (*)    MCV 79.7 (*)    MCH 23.5 (*)    MCHC 29.5 (*)    RDW 17.3 (*)    Lymphs Abs 0.3 (*)    Abs Immature Granulocytes 0.25 (*)    All other components within normal limits  LACTIC ACID, PLASMA  URINALYSIS, ROUTINE W REFLEX MICROSCOPIC  LACTIC ACID, PLASMA    EKG None  Radiology No results found.  Procedures Procedures   Medications Ordered in ED Medications  acetaminophen (TYLENOL) tablet 650 mg (has no administration in time range)    ED Course/ Medical Decision Making/ A&P                           Medical Decision Making 57 year old here with PMH of recent renal transplant in April 2023 on suppressive medications here for left shoulder and arm pain as well as right lower quadrant and flank pain.  For left shoulder pain is tenderness to anteriorly patient with good range of motion intact.  Does have good thrills on examination over fistula and 2+ radial pulses bilaterally.  Obtained venous and arterial ultrasounds of the left arm to rule out DVT or arterial insufficiency which came back normal and reassuring.  Left shoulder x-ray showed osteoarthritis but no acute issues.  Obtained baseline labs of CBC, CMP and lactic acid with creatinine around baseline at 2.23 (2.59 on 12/13 outpatient), LA only 0.8, and CBC with leukopenia although patient is on suppressive medications.  Given right sided abdominal pain for  her patient's transplant was considered appendicitis versus transplant rejection.  CT abdomen without contrast showed slight stranding around right kidney, stool burden, normal appendix. UA with 100 protein but does not appear infected. Will send urine culture. Plan to discuss with transplant team on plan for patient whether to discharge with follow up or transfer given stranding on CT discussed with Dr. Sherry Ruffing.  Amount and/or Complexity of Data Reviewed Labs: ordered. Radiology: ordered.  Risk OTC drugs.   Final Clinical Impression(s) / ED Diagnoses Final diagnoses:  None    Rx / DC Orders ED Discharge Orders     None         Gerrit Heck, MD 11/28/22 Louann, Gwenyth Allegra, MD 12/08/22 [phone]

## 2022-11-28 NOTE — Progress Notes (Signed)
Left upper extremity venous and arterial duplexes have been completed. Preliminary results can be found in CV Proc through chart review.  Results were given to Dr. Sherry Ruffing.  11/28/22 10:48 AM Fred Harrison RVT

## 2022-11-29 LAB — URINE CULTURE: Culture: NO GROWTH

## 2022-12-04 DIAGNOSIS — D849 Immunodeficiency, unspecified: Secondary | ICD-10-CM | POA: Diagnosis not present

## 2022-12-04 DIAGNOSIS — E875 Hyperkalemia: Secondary | ICD-10-CM | POA: Diagnosis not present

## 2022-12-04 DIAGNOSIS — D72819 Decreased white blood cell count, unspecified: Secondary | ICD-10-CM | POA: Diagnosis not present

## 2022-12-04 DIAGNOSIS — Z4822 Encounter for aftercare following kidney transplant: Secondary | ICD-10-CM | POA: Diagnosis not present

## 2022-12-04 DIAGNOSIS — Z940 Kidney transplant status: Secondary | ICD-10-CM | POA: Diagnosis not present

## 2022-12-04 DIAGNOSIS — E785 Hyperlipidemia, unspecified: Secondary | ICD-10-CM | POA: Diagnosis not present

## 2022-12-04 DIAGNOSIS — Z5181 Encounter for therapeutic drug level monitoring: Secondary | ICD-10-CM | POA: Diagnosis not present

## 2022-12-04 DIAGNOSIS — N184 Chronic kidney disease, stage 4 (severe): Secondary | ICD-10-CM | POA: Diagnosis not present

## 2022-12-04 DIAGNOSIS — D631 Anemia in chronic kidney disease: Secondary | ICD-10-CM | POA: Diagnosis not present

## 2022-12-04 DIAGNOSIS — R7303 Prediabetes: Secondary | ICD-10-CM | POA: Diagnosis not present

## 2022-12-04 DIAGNOSIS — I129 Hypertensive chronic kidney disease with stage 1 through stage 4 chronic kidney disease, or unspecified chronic kidney disease: Secondary | ICD-10-CM | POA: Diagnosis not present

## 2022-12-04 DIAGNOSIS — Z79621 Long term (current) use of calcineurin inhibitor: Secondary | ICD-10-CM | POA: Diagnosis not present

## 2022-12-04 DIAGNOSIS — I10 Essential (primary) hypertension: Secondary | ICD-10-CM | POA: Diagnosis not present

## 2022-12-04 DIAGNOSIS — Z79899 Other long term (current) drug therapy: Secondary | ICD-10-CM | POA: Diagnosis not present

## 2022-12-04 DIAGNOSIS — R809 Proteinuria, unspecified: Secondary | ICD-10-CM | POA: Diagnosis not present

## 2022-12-22 DIAGNOSIS — Z4822 Encounter for aftercare following kidney transplant: Secondary | ICD-10-CM | POA: Diagnosis not present

## 2022-12-22 DIAGNOSIS — Z5181 Encounter for therapeutic drug level monitoring: Secondary | ICD-10-CM | POA: Diagnosis not present

## 2022-12-22 DIAGNOSIS — Z79621 Long term (current) use of calcineurin inhibitor: Secondary | ICD-10-CM | POA: Diagnosis not present

## 2023-01-01 DIAGNOSIS — D849 Immunodeficiency, unspecified: Secondary | ICD-10-CM | POA: Diagnosis not present

## 2023-01-01 DIAGNOSIS — I120 Hypertensive chronic kidney disease with stage 5 chronic kidney disease or end stage renal disease: Secondary | ICD-10-CM | POA: Diagnosis not present

## 2023-01-01 DIAGNOSIS — N186 End stage renal disease: Secondary | ICD-10-CM | POA: Diagnosis not present

## 2023-01-01 DIAGNOSIS — R809 Proteinuria, unspecified: Secondary | ICD-10-CM | POA: Diagnosis not present

## 2023-01-01 DIAGNOSIS — Z940 Kidney transplant status: Secondary | ICD-10-CM | POA: Diagnosis not present

## 2023-01-01 DIAGNOSIS — I10 Essential (primary) hypertension: Secondary | ICD-10-CM | POA: Diagnosis not present

## 2023-01-01 DIAGNOSIS — D72819 Decreased white blood cell count, unspecified: Secondary | ICD-10-CM | POA: Diagnosis not present

## 2023-01-01 DIAGNOSIS — Z4822 Encounter for aftercare following kidney transplant: Secondary | ICD-10-CM | POA: Diagnosis not present

## 2023-01-01 DIAGNOSIS — D631 Anemia in chronic kidney disease: Secondary | ICD-10-CM | POA: Diagnosis not present

## 2023-01-08 ENCOUNTER — Ambulatory Visit: Payer: Medicare PPO | Attending: Internal Medicine

## 2023-01-08 VITALS — Ht 66.75 in | Wt 200.0 lb

## 2023-01-08 DIAGNOSIS — Z0000 Encounter for general adult medical examination without abnormal findings: Secondary | ICD-10-CM | POA: Diagnosis not present

## 2023-01-08 NOTE — Progress Notes (Signed)
I connected with  Fred Harrison on 01/08/23 by a audio enabled telemedicine application and verified that I am speaking with the correct person using two identifiers.  Patient Location: Home  Provider Location: Office/Clinic  I discussed the limitations of evaluation and management by telemedicine. The patient expressed understanding and agreed to proceed.  Subjective:   Fred Harrison is a 57 y.o. male who presents for an Initial Medicare Annual Wellness Visit.  Review of Systems     Cardiac Risk Factors include: advanced age (>59mn, >>20women);hypertension;male gender;obesity (BMI >30kg/m2)     Objective:    Today's Vitals   01/08/23 1127  Weight: 200 lb (90.7 kg)  Height: 5' 6.75" (1.695 m)   Body mass index is 31.56 kg/m.     01/08/2023   11:34 AM 01/20/2022    8:35 AM 12/13/2019    5:53 AM 09/15/2019   12:48 PM 05/29/2017    1:54 PM 03/17/2017    1:18 AM 08/13/2016    2:11 PM  Advanced Directives  Does Patient Have a Medical Advance Directive? No No No No No No No  Would patient like information on creating a medical advance directive?   No - Patient declined No - Patient declined No - Patient declined      Current Medications (verified) Outpatient Encounter Medications as of 01/08/2023  Medication Sig   amLODipine (NORVASC) 10 MG tablet Take 10 mg by mouth at bedtime.   aspirin EC 81 MG tablet Take 81 mg by mouth in the morning. Swallow whole.   iron polysaccharides (NIFEREX) 150 MG capsule Take 150 mg by mouth daily.   labetalol (NORMODYNE) 100 MG tablet Take 100 mg by mouth See admin instructions. Take 1 tablet (100 mg) by mouth twice daily on Sundays, Tuesdays, Thursdays & Saturdays. Take 1 tablet (100 mg) by mouth once in the evening on Mondays, Wednesdays, & Fridays.   magnesium oxide (MAG-OX) 400 MG tablet Take 800 mg by mouth daily. Take 2 tablets (800 mg total) by mouth daily. Take 2 hours before or after myforitc   mycophenolate (MYFORTIC) 180 MG EC  tablet Take 180 mg by mouth daily.   predniSONE (DELTASONE) 5 MG tablet Take 5 mg by mouth daily.   sulfamethoxazole-trimethoprim (BACTRIM) 400-80 MG tablet Take 1 tablet by mouth 3 (three) times a week. Mondays, Wednesdays, Fridays   tacrolimus (PROGRAF) 1 MG capsule Take 4 mg by mouth 2 (two) times daily.   atorvastatin (LIPITOR) 20 MG tablet TAKE 1 TABLET (20 MG TOTAL) BY MOUTH DAILY.   b complex-vitamin c-folic acid (NEPHRO-VITE) 0.8 MG TABS tablet Take 1 tablet by mouth in the morning. (Patient not taking: Reported on 11/28/2022)   heparin 1000 unit/ml SOLN injection Inject into the peritoneum as needed. (Patient not taking: Reported on 11/28/2022)   hydrochlorothiazide (MICROZIDE) 12.5 MG capsule Take 12.5 mg by mouth daily. (Patient not taking: Reported on 01/08/2023)   methylPREDNISolone sodium succinate in sodium chloride 0.9 % 50 mL Inject into the vein once. (Patient not taking: Reported on 11/28/2022)   pantoprazole (PROTONIX) 40 MG tablet Take 40 mg by mouth daily. (Patient not taking: Reported on 01/08/2023)   sevelamer (RENAGEL) 800 MG tablet Take 1,600-3,200 mg by mouth See admin instructions. Take 4 tablets (3200 mg) by mouth 3 times daily with each meal & take 2 tablet (1600 mg) by mouth with a snack twice daily (Patient not taking: Reported on 11/28/2022)   traMADol (ULTRAM) 50 MG tablet Take 1 tablet (50 mg total) by mouth  every 8 (eight) hours as needed for up to 7 days for Moderate pain (4-6). (Patient not taking: Reported on 11/05/2021)   valGANciclovir (VALCYTE) 450 MG tablet Take 450 mg by mouth daily. (Patient not taking: Reported on 01/08/2023)   [DISCONTINUED] isosorbide mononitrate (IMDUR) 30 MG 24 hr tablet TAKE 1 TABLET (30 MG TOTAL) BY MOUTH DAILY.   No facility-administered encounter medications on file as of 01/08/2023.    Allergies (verified) Patient has no known allergies.   History: Past Medical History:  Diagnosis Date   Chronic kidney disease M-W-F   hemodialysis     Hypertension    TIA (transient ischemic attack) 2013,2015   Past Surgical History:  Procedure Laterality Date   A/V FISTULAGRAM N/A 12/13/2019   Procedure: A/V FISTULAGRAM - Left Arm;  Surgeon: Serafina Mitchell, MD;  Location: [facility];  Service: Cardiovascular;  Laterality: N/A;   AV FISTULA PLACEMENT Left 09/16/2019   Procedure: ARTERIOVENOUS (AV) FISTULA CREATION;  Surgeon: Elam Dutch, MD;  Location: [facility] OR;  Service: Vascular;  Laterality: Left;   COLONOSCOPY WITH PROPOFOL N/A 01/20/2022   Procedure: COLONOSCOPY WITH PROPOFOL;  Surgeon: Irving Copas., MD;  Location: [facility];  Service: Gastroenterology;  Laterality: N/A;   CYST REMOVED     FROM HEAD   IR FLUORO GUIDE CV LINE RIGHT  09/14/2019   IR US GUIDE VASC ACCESS RIGHT  09/14/2019   KIDNEY TRANSPLANT Right    LIGATION OF COMPETING BRANCHES OF ARTERIOVENOUS FISTULA Left 12/13/2019   Procedure: BRANCH LIGATION OF LEFT ARTERIOVENOUS FISTULA;  Surgeon: Angelia Mould, MD;  Location: [facility] OR;  Service: Vascular;  Laterality: Left;   Family History  Problem Relation Age of Onset   Stroke Mother    Hypertension Mother    Heart attack Father    Cancer Sister    Colon cancer Neg Hx    Colon polyps Neg Hx    Esophageal cancer Neg Hx    Stomach cancer Neg Hx    Rectal cancer Neg Hx    Social History   Socioeconomic History   Marital status: Divorced    Spouse name: Not on file   Number of children: Not on file   Years of education: Not on file   Highest education level: Not on file  Occupational History   Not on file  Tobacco Use   Smoking status: Former    Types: Cigarettes    Quit date: 07/02/2016    Years since quitting: 6.5   Smokeless tobacco: Never  Vaping Use   Vaping Use: Never used  Substance and Sexual Activity   Alcohol use: No   Drug use: No   Sexual activity: Not on file  Other Topics Concern   Not on file  Social History Narrative   Not on file   Social  Determinants of Health   Financial Resource Strain: Low Risk  (01/08/2023)   Overall Financial Resource Strain (CARDIA)    Difficulty of Paying Living Expenses: Not hard at all  Food Insecurity: No Food Insecurity (01/08/2023)   Hunger Vital Sign    Worried About Running Out of Food in the Last Year: Never true    Ran Out of Food in the Last Year: Never true  Transportation Needs: No Transportation Needs (01/08/2023)   PRAPARE - Hydrologist (Medical): No    Lack of Transportation (Non-Medical): No  Physical Activity: Inactive (01/08/2023)   Exercise Vital Sign    Days  of Exercise per Week: 0 days    Minutes of Exercise per Session: 0 min  Stress: No Stress Concern Present (01/08/2023)   [place]    Feeling of Stress : Not at all  Social Connections: Not on file    Tobacco Counseling Counseling given: Not Answered   Clinical Intake:  Pre-visit preparation completed: Yes  Pain : No/denies pain     Nutritional Risks: None Diabetes: No  How often do you need to have someone help you when you read instructions, pamphlets, or other written materials from your doctor or pharmacy?: 1 - Never  Diabetic?no  Interpreter Needed?: No  Information entered by :: NAllen LPN   Activities of Daily Living    01/08/2023   11:36 AM  In your present state of health, do you have any difficulty performing the following activities:  Hearing? 0  Vision? 0  Difficulty concentrating or making decisions? 0  Walking or climbing stairs? 0  Dressing or bathing? 0  Doing errands, shopping? 0  Preparing Food and eating ? N  Using the Toilet? N  In the past six months, have you accidently leaked urine? N  Do you have problems with loss of bowel control? N  Managing your Medications? N  Managing your Finances? N  Housekeeping or managing your Housekeeping? N    Patient Care Team: Ladell Pier, MD as PCP - General (Internal Medicine)  Indicate any recent Medical Services you may have received from other than [facility] providers in the past year (date may be approximate).     Assessment:   This is a routine wellness examination for [facility].  Hearing/Vision screen Vision Screening - Comments:: No regular eye exams  Dietary issues and exercise activities discussed: Current Exercise Habits: The patient does not participate in regular exercise at present   Goals Addressed             This Visit's Progress    Patient Stated       01/08/2023, no goals       Depression Screen    01/08/2023   11:36 AM 06/06/2021    9:26 AM 06/07/2020    9:05 AM 02/09/2020    9:13 AM 09/06/2018   10:22 AM 01/05/2018    8:50 AM 12/01/2017    4:48 PM  PHQ 2/9 Scores  PHQ - 2 Score 0 0 0 0 0 0 0    Fall Risk    01/08/2023   11:35 AM 06/06/2021    9:26 AM 06/07/2020    9:05 AM 01/05/2018    8:52 AM 08/13/2016    2:09 PM  Fall Risk   Falls in the past year? 1 0 0 No No  Comment felt lightheaded      Number falls in past yr: 0 0 0    Injury with Fall? 0 0 1    Risk for fall due to : Medication side effect No Fall Risks     Follow up Falls prevention discussed;Education provided;Falls evaluation completed        FALL RISK PREVENTION PERTAINING TO THE HOME:  Any stairs in or around the home? Yes  If so, are there any without handrails? No  Home free of loose throw rugs in walkways, pet beds, electrical cords, etc? Yes  Adequate lighting in your home to reduce risk of falls? Yes   ASSISTIVE DEVICES UTILIZED TO PREVENT FALLS:  Life alert? No  Use of a  cane, walker or w/c? No  Grab bars in the bathroom? No  Shower chair or bench in shower? No  Elevated toilet seat or a handicapped toilet? No   TIMED UP AND GO:  Was the test performed? No .      Cognitive Function:        01/08/2023   11:39 AM  6CIT Screen  What Year? 0 points  What month? 0 points  What time? 0  points  Count back from 20 0 points  Months in reverse 0 points  Repeat phrase 4 points  Total Score 4 points    Immunizations Immunization History  Administered Date(s) Administered   Hepatitis B 11/02/2019, 12/07/2019, 01/13/2020, 05/04/2020   Influenza,inj,Quad PF,6+ Mos 10/06/2019   Moderna SARS-COV2 Booster Vaccination 01/22/2021, 05/23/2021   Moderna Sars-Covid-2 Vaccination 01/15/2020, 03/16/2020   Pneumococcal Polysaccharide-23 08/04/2016, 01/21/2021   Tdap 01/21/2018   Zoster Recombinat (Shingrix) 06/06/2021, 10/08/2021    TDAP status: Up to date  Flu Vaccine status: Up to date  Pneumococcal vaccine status: Up to date  Covid-19 vaccine status: Completed vaccines  Qualifies for Shingles Vaccine? Yes   Zostavax completed No   Shingrix Completed?: Yes  Screening Tests Health Maintenance  Topic Date Due   Medicare Annual Wellness (AWV)  Never done   Lung Cancer Screening  Never done   COVID-19 Vaccine (5 - 2023-24 season) 07/25/2022   COLONOSCOPY (Pts 45-16yr Insurance coverage will need to be confirmed)  01/20/2023   DTaP/Tdap/Td (2 - Td or Tdap) 01/22/2028   INFLUENZA VACCINE  Completed   Hepatitis C Screening  Completed   HIV Screening  Completed   Zoster Vaccines- Shingrix  Completed   HPV VACCINES  Aged Out   Fecal DNA (Cologuard)  Discontinued    Health Maintenance  Health Maintenance Due  Topic Date Due   Medicare Annual Wellness (AWV)  Never done   Lung Cancer Screening  Never done   COVID-19 Vaccine (5 - 2023-24 season) 07/25/2022    Colorectal cancer screening: Type of screening: Colonoscopy. Completed 01/20/2022. Repeat every 10 years  Lung Cancer Screening: (Low Dose CT Chest recommended if Age 59-80years, 30 pack-year currently smoking OR have quit w/in 15years.) does not qualify.   Lung Cancer Screening Referral: no  Additional Screening:  Hepatitis C Screening: does qualify; Completed 09/15/2019  Vision Screening: Recommended  annual ophthalmology exams for early detection of glaucoma and other disorders of the eye. Is the patient up to date with their annual eye exam?  No  Who is the provider or what is the name of the office in which the patient attends annual eye exams? none If pt is not established with a provider, would they like to be referred to a provider to establish care? No .   Dental Screening: Recommended annual dental exams for proper oral hygiene  Community Resource Referral / Chronic Care Management: CRR required this visit?  No   CCM required this visit?  No      Plan:     I have personally reviewed and noted the following in the patient's chart:   Medical and social history Use of alcohol, tobacco or illicit drugs  Current medications and supplements including opioid prescriptions. Patient is not currently taking opioid prescriptions. Functional ability and status Nutritional status Physical activity Advanced directives List of other physicians Hospitalizations, surgeries, and ER visits in previous 12 months Vitals Screenings to include cognitive, depression, and falls Referrals and appointments  In addition, I have reviewed and  discussed with patient certain preventive protocols, quality metrics, and best practice recommendations. A written personalized care plan for preventive services as well as general preventive health recommendations were provided to patient.     Kellie Simmering, LPN   [id number]   Nurse Notes: none  Due to this being a virtual visit, the after visit summary with patients personalized plan was offered to patient via mail or my-chart.  Patient would like to access on my-chart

## 2023-01-08 NOTE — Patient Instructions (Signed)
Mr. Fred Harrison , Thank you for taking time to come for your Medicare Wellness Visit. I appreciate your ongoing commitment to your health goals. Please review the following plan we discussed and let me know if I can assist you in the future.   These are the goals we discussed:  Goals      Patient Stated     01/08/2023, no goals        This is a list of the screening recommended for you and due dates:  Health Maintenance  Topic Date Due   Screening for Lung Cancer  Never done   COVID-19 Vaccine (5 - 2023-24 season) 07/25/2022   Colon Cancer Screening  01/20/2023   Medicare Annual Wellness Visit  01/09/2024   DTaP/Tdap/Td vaccine (2 - Td or Tdap) 01/22/2028   Flu Shot  Completed   Hepatitis C Screening: USPSTF Recommendation to screen - Ages 18-79 yo.  Completed   HIV Screening  Completed   Zoster (Shingles) Vaccine  Completed   HPV Vaccine  Aged Out   Cologuard (Stool DNA test)  Discontinued    Advanced directives: Advance directive discussed with you today. Even though you declined this today please call our office should you change your mind and we can give you the proper paperwork for you to fill out.  Conditions/risks identified: none  Next appointment: Follow up in one year for your annual wellness visit   Preventive Care 40-64 Years, Male Preventive care refers to lifestyle choices and visits with your health care provider that can promote health and wellness. What does preventive care include? A yearly physical exam. This is also called an annual well check. Dental exams once or twice a year. Routine eye exams. Ask your health care provider how often you should have your eyes checked. Personal lifestyle choices, including: Daily care of your teeth and gums. Regular physical activity. Eating a healthy diet. Avoiding tobacco and drug use. Limiting alcohol use. Practicing safe sex. Taking low-dose aspirin every day starting at age 60. What happens during an annual well  check? The services and screenings done by your health care provider during your annual well check will depend on your age, overall health, lifestyle risk factors, and family history of disease. Counseling  Your health care provider may ask you questions about your: Alcohol use. Tobacco use. Drug use. Emotional well-being. Home and relationship well-being. Sexual activity. Eating habits. Work and work Statistician. Screening  You may have the following tests or measurements: Height, weight, and BMI. Blood pressure. Lipid and cholesterol levels. These may be checked every 5 years, or more frequently if you are over 50 years old. Skin check. Lung cancer screening. You may have this screening every year starting at age 55 if you have a 30-pack-year history of smoking and currently smoke or have quit within the past 15 years. Fecal occult blood test (FOBT) of the stool. You may have this test every year starting at age 4. Flexible sigmoidoscopy or colonoscopy. You may have a sigmoidoscopy every 5 years or a colonoscopy every 10 years starting at age 55. Prostate cancer screening. Recommendations will vary depending on your family history and other risks. Hepatitis C blood test. Hepatitis B blood test. Sexually transmitted disease (STD) testing. Diabetes screening. This is done by checking your blood sugar (glucose) after you have not eaten for a while (fasting). You may have this done every 1-3 years. Discuss your test results, treatment options, and if necessary, the need for more tests with your  health care provider. Vaccines  Your health care provider may recommend certain vaccines, such as: Influenza vaccine. This is recommended every year. Tetanus, diphtheria, and acellular pertussis (Tdap, Td) vaccine. You may need a Td booster every 10 years. Zoster vaccine. You may need this after age 56. Pneumococcal 13-valent conjugate (PCV13) vaccine. You may need this if you have certain  conditions and have not been vaccinated. Pneumococcal polysaccharide (PPSV23) vaccine. You may need one or two doses if you smoke cigarettes or if you have certain conditions. Talk to your health care provider about which screenings and vaccines you need and how often you need them. This information is not intended to replace advice given to you by your health care provider. Make sure you discuss any questions you have with your health care provider. Document Released: 12/07/2015 Document Revised: 07/30/2016 Document Reviewed: 09/11/2015 Elsevier Interactive Patient Education  2017 [place] Prevention in the Home Falls can cause injuries. They can happen to people of all ages. There are many things you can do to make your home safe and to help prevent falls. What can I do on the outside of my home? Regularly fix the edges of walkways and driveways and fix any cracks. Remove anything that might make you trip as you walk through a door, such as a raised step or threshold. Trim any bushes or trees on the path to your home. Use bright outdoor lighting. Clear any walking paths of anything that might make someone trip, such as rocks or tools. Regularly check to see if handrails are loose or broken. Make sure that both sides of any steps have handrails. Any raised decks and porches should have guardrails on the edges. Have any leaves, snow, or ice cleared regularly. Use sand or salt on walking paths during winter. Clean up any spills in your garage right away. This includes oil or grease spills. What can I do in the bathroom? Use night lights. Install grab bars by the toilet and in the tub and shower. Do not use towel bars as grab bars. Use non-skid mats or decals in the tub or shower. If you need to sit down in the shower, use a plastic, non-slip stool. Keep the floor dry. Clean up any water that spills on the floor as soon as it happens. Remove soap buildup in the tub or shower  regularly. Attach bath mats securely with double-sided non-slip rug tape. Do not have throw rugs and other things on the floor that can make you trip. What can I do in the bedroom? Use night lights. Make sure that you have a light by your bed that is easy to reach. Do not use any sheets or blankets that are too big for your bed. They should not hang down onto the floor. Have a firm chair that has side arms. You can use this for support while you get dressed. Do not have throw rugs and other things on the floor that can make you trip. What can I do in the kitchen? Clean up any spills right away. Avoid walking on wet floors. Keep items that you use a lot in easy-to-reach places. If you need to reach something above you, use a strong step stool that has a grab bar. Keep electrical cords out of the way. Do not use floor polish or wax that makes floors slippery. If you must use wax, use non-skid floor wax. Do not have throw rugs and other things on the floor that can make  you trip. What can I do with my stairs? Do not leave any items on the stairs. Make sure that there are handrails on both sides of the stairs and use them. Fix handrails that are broken or loose. Make sure that handrails are as long as the stairways. Check any carpeting to make sure that it is firmly attached to the stairs. Fix any carpet that is loose or worn. Avoid having throw rugs at the top or bottom of the stairs. If you do have throw rugs, attach them to the floor with carpet tape. Make sure that you have a light switch at the top of the stairs and the bottom of the stairs. If you do not have them, ask someone to add them for you. What else can I do to help prevent falls? Wear shoes that: Do not have high heels. Have rubber bottoms. Are comfortable and fit you well. Are closed at the toe. Do not wear sandals. If you use a stepladder: Make sure that it is fully opened. Do not climb a closed stepladder. Make sure that  both sides of the stepladder are locked into place. Ask someone to hold it for you, if possible. Clearly mark and make sure that you can see: Any grab bars or handrails. First and last steps. Where the edge of each step is. Use tools that help you move around (mobility aids) if they are needed. These include: Canes. Walkers. Scooters. Crutches. Turn on the lights when you go into a dark area. Replace any light bulbs as soon as they burn out. Set up your furniture so you have a clear path. Avoid moving your furniture around. If any of your floors are uneven, fix them. If there are any pets around you, be aware of where they are. Review your medicines with your doctor. Some medicines can make you feel dizzy. This can increase your chance of falling. Ask your doctor what other things that you can do to help prevent falls. This information is not intended to replace advice given to you by your health care provider. Make sure you discuss any questions you have with your health care provider. Document Released: 09/06/2009 Document Revised: 04/17/2016 Document Reviewed: 12/15/2014 Elsevier Interactive Patient Education  2017 [employer].

## 2023-01-15 DIAGNOSIS — Z4822 Encounter for aftercare following kidney transplant: Secondary | ICD-10-CM | POA: Diagnosis not present

## 2023-01-15 DIAGNOSIS — Z79621 Long term (current) use of calcineurin inhibitor: Secondary | ICD-10-CM | POA: Diagnosis not present

## 2023-01-15 DIAGNOSIS — Z5181 Encounter for therapeutic drug level monitoring: Secondary | ICD-10-CM | POA: Diagnosis not present

## 2023-01-29 DIAGNOSIS — Z940 Kidney transplant status: Secondary | ICD-10-CM | POA: Diagnosis not present

## 2023-01-29 DIAGNOSIS — Z79899 Other long term (current) drug therapy: Secondary | ICD-10-CM | POA: Diagnosis not present

## 2023-02-26 DIAGNOSIS — Z79899 Other long term (current) drug therapy: Secondary | ICD-10-CM | POA: Diagnosis not present

## 2023-02-26 DIAGNOSIS — Z79621 Long term (current) use of calcineurin inhibitor: Secondary | ICD-10-CM | POA: Diagnosis not present

## 2023-02-26 DIAGNOSIS — R7303 Prediabetes: Secondary | ICD-10-CM | POA: Diagnosis not present

## 2023-02-26 DIAGNOSIS — I10 Essential (primary) hypertension: Secondary | ICD-10-CM | POA: Diagnosis not present

## 2023-02-26 DIAGNOSIS — B348 Other viral infections of unspecified site: Secondary | ICD-10-CM | POA: Diagnosis not present

## 2023-02-26 DIAGNOSIS — D849 Immunodeficiency, unspecified: Secondary | ICD-10-CM | POA: Diagnosis not present

## 2023-02-26 DIAGNOSIS — Z4822 Encounter for aftercare following kidney transplant: Secondary | ICD-10-CM | POA: Diagnosis not present

## 2023-02-26 DIAGNOSIS — Z5181 Encounter for therapeutic drug level monitoring: Secondary | ICD-10-CM | POA: Diagnosis not present

## 2023-02-26 DIAGNOSIS — Z940 Kidney transplant status: Secondary | ICD-10-CM | POA: Diagnosis not present

## 2023-03-12 DIAGNOSIS — Z940 Kidney transplant status: Secondary | ICD-10-CM | POA: Diagnosis not present

## 2023-03-26 DIAGNOSIS — Z940 Kidney transplant status: Secondary | ICD-10-CM | POA: Diagnosis not present

## 2023-03-26 DIAGNOSIS — D849 Immunodeficiency, unspecified: Secondary | ICD-10-CM | POA: Diagnosis not present

## 2023-03-26 DIAGNOSIS — Z5181 Encounter for therapeutic drug level monitoring: Secondary | ICD-10-CM | POA: Diagnosis not present

## 2023-03-26 DIAGNOSIS — B348 Other viral infections of unspecified site: Secondary | ICD-10-CM | POA: Diagnosis not present

## 2023-03-26 DIAGNOSIS — N186 End stage renal disease: Secondary | ICD-10-CM | POA: Diagnosis not present

## 2023-04-23 DIAGNOSIS — Z940 Kidney transplant status: Secondary | ICD-10-CM | POA: Diagnosis not present

## 2023-04-23 DIAGNOSIS — D849 Immunodeficiency, unspecified: Secondary | ICD-10-CM | POA: Diagnosis not present

## 2023-04-23 DIAGNOSIS — I10 Essential (primary) hypertension: Secondary | ICD-10-CM | POA: Diagnosis not present

## 2023-04-23 DIAGNOSIS — Z79899 Other long term (current) drug therapy: Secondary | ICD-10-CM | POA: Diagnosis not present

## 2023-04-23 DIAGNOSIS — B348 Other viral infections of unspecified site: Secondary | ICD-10-CM | POA: Diagnosis not present

## 2023-04-23 DIAGNOSIS — Z79621 Long term (current) use of calcineurin inhibitor: Secondary | ICD-10-CM | POA: Diagnosis not present

## 2023-04-23 DIAGNOSIS — Z5181 Encounter for therapeutic drug level monitoring: Secondary | ICD-10-CM | POA: Diagnosis not present

## 2023-05-21 DIAGNOSIS — Z79899 Other long term (current) drug therapy: Secondary | ICD-10-CM | POA: Diagnosis not present

## 2023-05-21 DIAGNOSIS — Z940 Kidney transplant status: Secondary | ICD-10-CM | POA: Diagnosis not present

## 2023-05-21 DIAGNOSIS — D849 Immunodeficiency, unspecified: Secondary | ICD-10-CM | POA: Diagnosis not present

## 2023-05-21 DIAGNOSIS — B259 Cytomegaloviral disease, unspecified: Secondary | ICD-10-CM | POA: Diagnosis not present

## 2023-05-21 DIAGNOSIS — B348 Other viral infections of unspecified site: Secondary | ICD-10-CM | POA: Diagnosis not present

## 2023-06-18 DIAGNOSIS — D509 Iron deficiency anemia, unspecified: Secondary | ICD-10-CM | POA: Diagnosis not present

## 2023-06-18 DIAGNOSIS — Z5181 Encounter for therapeutic drug level monitoring: Secondary | ICD-10-CM | POA: Diagnosis not present

## 2023-06-18 DIAGNOSIS — Z79621 Long term (current) use of calcineurin inhibitor: Secondary | ICD-10-CM | POA: Diagnosis not present

## 2023-06-18 DIAGNOSIS — Z940 Kidney transplant status: Secondary | ICD-10-CM | POA: Diagnosis not present

## 2023-06-18 DIAGNOSIS — Z79899 Other long term (current) drug therapy: Secondary | ICD-10-CM | POA: Diagnosis not present

## 2023-06-18 DIAGNOSIS — B348 Other viral infections of unspecified site: Secondary | ICD-10-CM | POA: Diagnosis not present

## 2023-06-18 DIAGNOSIS — D849 Immunodeficiency, unspecified: Secondary | ICD-10-CM | POA: Diagnosis not present

## 2023-07-16 DIAGNOSIS — Z5181 Encounter for therapeutic drug level monitoring: Secondary | ICD-10-CM | POA: Diagnosis not present

## 2023-07-16 DIAGNOSIS — D509 Iron deficiency anemia, unspecified: Secondary | ICD-10-CM | POA: Diagnosis not present

## 2023-07-16 DIAGNOSIS — Z79621 Long term (current) use of calcineurin inhibitor: Secondary | ICD-10-CM | POA: Diagnosis not present

## 2023-07-16 DIAGNOSIS — Z79899 Other long term (current) drug therapy: Secondary | ICD-10-CM | POA: Diagnosis not present

## 2023-07-16 DIAGNOSIS — B348 Other viral infections of unspecified site: Secondary | ICD-10-CM | POA: Diagnosis not present

## 2023-07-16 DIAGNOSIS — D849 Immunodeficiency, unspecified: Secondary | ICD-10-CM | POA: Diagnosis not present

## 2023-07-16 DIAGNOSIS — D631 Anemia in chronic kidney disease: Secondary | ICD-10-CM | POA: Diagnosis not present

## 2023-07-16 DIAGNOSIS — Z940 Kidney transplant status: Secondary | ICD-10-CM | POA: Diagnosis not present

## 2023-07-16 DIAGNOSIS — B259 Cytomegaloviral disease, unspecified: Secondary | ICD-10-CM | POA: Diagnosis not present

## 2023-07-16 DIAGNOSIS — N1832 Chronic kidney disease, stage 3b: Secondary | ICD-10-CM | POA: Diagnosis not present

## 2023-07-23 DIAGNOSIS — Z940 Kidney transplant status: Secondary | ICD-10-CM | POA: Diagnosis not present

## 2023-07-23 DIAGNOSIS — B348 Other viral infections of unspecified site: Secondary | ICD-10-CM | POA: Diagnosis not present

## 2023-07-23 DIAGNOSIS — B259 Cytomegaloviral disease, unspecified: Secondary | ICD-10-CM | POA: Diagnosis not present

## 2023-07-23 DIAGNOSIS — Z5181 Encounter for therapeutic drug level monitoring: Secondary | ICD-10-CM | POA: Diagnosis not present

## 2023-07-23 DIAGNOSIS — D509 Iron deficiency anemia, unspecified: Secondary | ICD-10-CM | POA: Diagnosis not present

## 2023-07-23 DIAGNOSIS — D849 Immunodeficiency, unspecified: Secondary | ICD-10-CM | POA: Diagnosis not present

## 2023-07-23 DIAGNOSIS — Z79899 Other long term (current) drug therapy: Secondary | ICD-10-CM | POA: Diagnosis not present

## 2023-07-23 DIAGNOSIS — D631 Anemia in chronic kidney disease: Secondary | ICD-10-CM | POA: Diagnosis not present

## 2023-07-23 DIAGNOSIS — N1832 Chronic kidney disease, stage 3b: Secondary | ICD-10-CM | POA: Diagnosis not present

## 2023-07-30 DIAGNOSIS — D631 Anemia in chronic kidney disease: Secondary | ICD-10-CM | POA: Diagnosis not present

## 2023-07-30 DIAGNOSIS — N1832 Chronic kidney disease, stage 3b: Secondary | ICD-10-CM | POA: Diagnosis not present

## 2023-07-30 DIAGNOSIS — Z940 Kidney transplant status: Secondary | ICD-10-CM | POA: Diagnosis not present

## 2023-08-06 DIAGNOSIS — Z940 Kidney transplant status: Secondary | ICD-10-CM | POA: Diagnosis not present

## 2023-08-06 DIAGNOSIS — N1832 Chronic kidney disease, stage 3b: Secondary | ICD-10-CM | POA: Diagnosis not present

## 2023-08-06 DIAGNOSIS — D631 Anemia in chronic kidney disease: Secondary | ICD-10-CM | POA: Diagnosis not present

## 2023-08-12 DIAGNOSIS — Z940 Kidney transplant status: Secondary | ICD-10-CM | POA: Diagnosis not present

## 2023-08-13 DIAGNOSIS — Z940 Kidney transplant status: Secondary | ICD-10-CM | POA: Diagnosis not present

## 2023-08-13 DIAGNOSIS — N1832 Chronic kidney disease, stage 3b: Secondary | ICD-10-CM | POA: Diagnosis not present

## 2023-08-13 DIAGNOSIS — D631 Anemia in chronic kidney disease: Secondary | ICD-10-CM | POA: Diagnosis not present

## 2023-09-09 DIAGNOSIS — Z940 Kidney transplant status: Secondary | ICD-10-CM | POA: Diagnosis not present

## 2023-10-07 DIAGNOSIS — Z940 Kidney transplant status: Secondary | ICD-10-CM | POA: Diagnosis not present

## 2023-11-04 DIAGNOSIS — Z940 Kidney transplant status: Secondary | ICD-10-CM | POA: Diagnosis not present

## 2023-11-12 DIAGNOSIS — Z940 Kidney transplant status: Secondary | ICD-10-CM | POA: Diagnosis not present

## 2023-11-12 DIAGNOSIS — R93421 Abnormal radiologic findings on diagnostic imaging of right kidney: Secondary | ICD-10-CM | POA: Diagnosis not present

## 2023-11-12 DIAGNOSIS — R809 Proteinuria, unspecified: Secondary | ICD-10-CM | POA: Diagnosis not present

## 2023-11-19 DIAGNOSIS — R809 Proteinuria, unspecified: Secondary | ICD-10-CM | POA: Diagnosis not present

## 2023-11-19 DIAGNOSIS — T8619 Other complication of kidney transplant: Secondary | ICD-10-CM | POA: Diagnosis not present

## 2023-11-19 DIAGNOSIS — N269 Renal sclerosis, unspecified: Secondary | ICD-10-CM | POA: Diagnosis not present

## 2023-11-19 DIAGNOSIS — Z940 Kidney transplant status: Secondary | ICD-10-CM | POA: Diagnosis not present

## 2023-11-19 DIAGNOSIS — N261 Atrophy of kidney (terminal): Secondary | ICD-10-CM | POA: Diagnosis not present

## 2023-11-26 DIAGNOSIS — Z940 Kidney transplant status: Secondary | ICD-10-CM | POA: Diagnosis not present

## 2023-11-27 DIAGNOSIS — R809 Proteinuria, unspecified: Secondary | ICD-10-CM | POA: Diagnosis not present

## 2023-11-27 DIAGNOSIS — T8611 Kidney transplant rejection: Secondary | ICD-10-CM | POA: Diagnosis not present

## 2023-11-27 DIAGNOSIS — Z7969 Long term (current) use of other immunomodulators and immunosuppressants: Secondary | ICD-10-CM | POA: Diagnosis not present

## 2023-11-27 DIAGNOSIS — Z4822 Encounter for aftercare following kidney transplant: Secondary | ICD-10-CM | POA: Diagnosis not present

## 2023-11-27 DIAGNOSIS — D849 Immunodeficiency, unspecified: Secondary | ICD-10-CM | POA: Diagnosis not present

## 2023-11-27 DIAGNOSIS — Z940 Kidney transplant status: Secondary | ICD-10-CM | POA: Diagnosis not present

## 2023-11-30 DIAGNOSIS — Z940 Kidney transplant status: Secondary | ICD-10-CM | POA: Diagnosis not present

## 2023-12-11 DIAGNOSIS — J069 Acute upper respiratory infection, unspecified: Secondary | ICD-10-CM | POA: Diagnosis not present

## 2023-12-11 DIAGNOSIS — Z940 Kidney transplant status: Secondary | ICD-10-CM | POA: Diagnosis not present

## 2023-12-11 DIAGNOSIS — Z79899 Other long term (current) drug therapy: Secondary | ICD-10-CM | POA: Diagnosis not present

## 2023-12-11 DIAGNOSIS — E875 Hyperkalemia: Secondary | ICD-10-CM | POA: Diagnosis not present

## 2023-12-11 DIAGNOSIS — Z5181 Encounter for therapeutic drug level monitoring: Secondary | ICD-10-CM | POA: Diagnosis not present

## 2023-12-11 DIAGNOSIS — I10 Essential (primary) hypertension: Secondary | ICD-10-CM | POA: Diagnosis not present

## 2023-12-11 DIAGNOSIS — Z4822 Encounter for aftercare following kidney transplant: Secondary | ICD-10-CM | POA: Diagnosis not present

## 2023-12-11 DIAGNOSIS — T8611 Kidney transplant rejection: Secondary | ICD-10-CM | POA: Diagnosis not present

## 2023-12-11 DIAGNOSIS — D849 Immunodeficiency, unspecified: Secondary | ICD-10-CM | POA: Diagnosis not present

## 2023-12-25 DIAGNOSIS — Z940 Kidney transplant status: Secondary | ICD-10-CM | POA: Diagnosis not present

## 2024-01-22 DIAGNOSIS — B348 Other viral infections of unspecified site: Secondary | ICD-10-CM | POA: Diagnosis not present

## 2024-01-22 DIAGNOSIS — Z4822 Encounter for aftercare following kidney transplant: Secondary | ICD-10-CM | POA: Diagnosis not present

## 2024-01-22 DIAGNOSIS — D638 Anemia in other chronic diseases classified elsewhere: Secondary | ICD-10-CM | POA: Diagnosis not present

## 2024-01-22 DIAGNOSIS — Z7969 Long term (current) use of other immunomodulators and immunosuppressants: Secondary | ICD-10-CM | POA: Diagnosis not present

## 2024-01-22 DIAGNOSIS — I10 Essential (primary) hypertension: Secondary | ICD-10-CM | POA: Diagnosis not present

## 2024-01-22 DIAGNOSIS — Z940 Kidney transplant status: Secondary | ICD-10-CM | POA: Diagnosis not present

## 2024-01-22 DIAGNOSIS — B259 Cytomegaloviral disease, unspecified: Secondary | ICD-10-CM | POA: Diagnosis not present

## 2024-01-22 DIAGNOSIS — D849 Immunodeficiency, unspecified: Secondary | ICD-10-CM | POA: Diagnosis not present

## 2024-02-19 DIAGNOSIS — D849 Immunodeficiency, unspecified: Secondary | ICD-10-CM | POA: Diagnosis not present

## 2024-02-19 DIAGNOSIS — Z8619 Personal history of other infectious and parasitic diseases: Secondary | ICD-10-CM | POA: Diagnosis not present

## 2024-02-19 DIAGNOSIS — Z79899 Other long term (current) drug therapy: Secondary | ICD-10-CM | POA: Diagnosis not present

## 2024-02-19 DIAGNOSIS — Z940 Kidney transplant status: Secondary | ICD-10-CM | POA: Diagnosis not present

## 2024-02-19 DIAGNOSIS — B348 Other viral infections of unspecified site: Secondary | ICD-10-CM | POA: Diagnosis not present

## 2024-02-19 DIAGNOSIS — R801 Persistent proteinuria, unspecified: Secondary | ICD-10-CM | POA: Diagnosis not present

## 2024-02-19 DIAGNOSIS — E875 Hyperkalemia: Secondary | ICD-10-CM | POA: Diagnosis not present

## 2024-02-19 DIAGNOSIS — Z4822 Encounter for aftercare following kidney transplant: Secondary | ICD-10-CM | POA: Diagnosis not present

## 2024-02-19 DIAGNOSIS — Z7969 Long term (current) use of other immunomodulators and immunosuppressants: Secondary | ICD-10-CM | POA: Diagnosis not present

## 2024-02-19 DIAGNOSIS — R062 Wheezing: Secondary | ICD-10-CM | POA: Diagnosis not present

## 2024-03-22 DIAGNOSIS — Z940 Kidney transplant status: Secondary | ICD-10-CM | POA: Diagnosis not present

## 2024-03-22 DIAGNOSIS — D649 Anemia, unspecified: Secondary | ICD-10-CM | POA: Diagnosis not present

## 2024-03-22 DIAGNOSIS — E559 Vitamin D deficiency, unspecified: Secondary | ICD-10-CM | POA: Diagnosis not present

## 2024-04-20 DIAGNOSIS — R079 Chest pain, unspecified: Secondary | ICD-10-CM | POA: Diagnosis not present

## 2024-04-20 DIAGNOSIS — N184 Chronic kidney disease, stage 4 (severe): Secondary | ICD-10-CM | POA: Diagnosis not present

## 2024-04-20 DIAGNOSIS — D849 Immunodeficiency, unspecified: Secondary | ICD-10-CM | POA: Diagnosis not present

## 2024-04-20 DIAGNOSIS — Z7969 Long term (current) use of other immunomodulators and immunosuppressants: Secondary | ICD-10-CM | POA: Diagnosis not present

## 2024-04-20 DIAGNOSIS — B459 Cryptococcosis, unspecified: Secondary | ICD-10-CM | POA: Diagnosis not present

## 2024-04-20 DIAGNOSIS — B451 Cerebral cryptococcosis: Secondary | ICD-10-CM | POA: Diagnosis not present

## 2024-04-20 DIAGNOSIS — D509 Iron deficiency anemia, unspecified: Secondary | ICD-10-CM | POA: Diagnosis not present

## 2024-04-20 DIAGNOSIS — Z79621 Long term (current) use of calcineurin inhibitor: Secondary | ICD-10-CM | POA: Diagnosis not present

## 2024-04-20 DIAGNOSIS — N1832 Chronic kidney disease, stage 3b: Secondary | ICD-10-CM | POA: Diagnosis not present

## 2024-04-20 DIAGNOSIS — E876 Hypokalemia: Secondary | ICD-10-CM | POA: Diagnosis not present

## 2024-04-20 DIAGNOSIS — B259 Cytomegaloviral disease, unspecified: Secondary | ICD-10-CM | POA: Diagnosis not present

## 2024-04-20 DIAGNOSIS — Z5181 Encounter for therapeutic drug level monitoring: Secondary | ICD-10-CM | POA: Diagnosis not present

## 2024-04-20 DIAGNOSIS — Z7960 Long term (current) use of unspecified immunomodulators and immunosuppressants: Secondary | ICD-10-CM | POA: Diagnosis not present

## 2024-04-20 DIAGNOSIS — E559 Vitamin D deficiency, unspecified: Secondary | ICD-10-CM | POA: Diagnosis not present

## 2024-04-20 DIAGNOSIS — Z79899 Other long term (current) drug therapy: Secondary | ICD-10-CM | POA: Diagnosis not present

## 2024-04-20 DIAGNOSIS — D631 Anemia in chronic kidney disease: Secondary | ICD-10-CM | POA: Diagnosis not present

## 2024-04-20 DIAGNOSIS — I639 Cerebral infarction, unspecified: Secondary | ICD-10-CM | POA: Diagnosis not present

## 2024-04-20 DIAGNOSIS — I129 Hypertensive chronic kidney disease with stage 1 through stage 4 chronic kidney disease, or unspecified chronic kidney disease: Secondary | ICD-10-CM | POA: Diagnosis not present

## 2024-04-20 DIAGNOSIS — R918 Other nonspecific abnormal finding of lung field: Secondary | ICD-10-CM | POA: Diagnosis not present

## 2024-04-20 DIAGNOSIS — Z940 Kidney transplant status: Secondary | ICD-10-CM | POA: Diagnosis not present

## 2024-04-20 DIAGNOSIS — D84821 Immunodeficiency due to drugs: Secondary | ICD-10-CM | POA: Diagnosis not present

## 2024-04-20 DIAGNOSIS — R0789 Other chest pain: Secondary | ICD-10-CM | POA: Diagnosis not present

## 2024-04-20 DIAGNOSIS — B457 Disseminated cryptococcosis: Secondary | ICD-10-CM | POA: Diagnosis not present

## 2024-04-20 DIAGNOSIS — R801 Persistent proteinuria, unspecified: Secondary | ICD-10-CM | POA: Diagnosis not present

## 2024-04-20 DIAGNOSIS — I6389 Other cerebral infarction: Secondary | ICD-10-CM | POA: Diagnosis not present

## 2024-04-20 DIAGNOSIS — G9389 Other specified disorders of brain: Secondary | ICD-10-CM | POA: Diagnosis not present

## 2024-04-20 DIAGNOSIS — I10 Essential (primary) hypertension: Secondary | ICD-10-CM | POA: Diagnosis not present

## 2024-04-26 ENCOUNTER — Telehealth: Payer: Self-pay | Admitting: *Deleted

## 2024-04-26 DIAGNOSIS — B451 Cerebral cryptococcosis: Secondary | ICD-10-CM | POA: Diagnosis not present

## 2024-04-26 DIAGNOSIS — Z940 Kidney transplant status: Secondary | ICD-10-CM | POA: Diagnosis not present

## 2024-04-26 NOTE — Transitions of Care (Post Inpatient/ED Visit) (Signed)
 04/26/2024  Name: Fred Harrison MRN: [id number] DOB: August 01, 1966  Today's TOC FU Call Status: Today's TOC FU Call Status:: Successful TOC FU Call Completed TOC FU Call Complete Date: 04/26/24 Patient's Name and Date of Birth confirmed.  Transition Care Management Follow-up Telephone Call Date of Discharge: 04/25/24 Discharge Facility: Other [facility]) Name of Other (Non[facility]) Discharge Facility: [facility] Type of Discharge: Inpatient Admission Primary Inpatient Discharge Diagnosis:: Cryptococcus How have you been since you were released from the hospital?: Better Any questions or concerns?: No  Items Reviewed: Did you receive and understand the discharge instructions provided?: Yes Medications obtained,verified, and reconciled?: Yes (Medications Reviewed) Any new allergies since your discharge?: No Dietary orders reviewed?: Yes Type of Diet Ordered:: Renal diet Do you have support at home?: Yes People in Home [RPT]: child(ren), adult Name of Support/Comfort Primary Source: Daughter/Delilah  Medications Reviewed Today: Medications Reviewed Today     Reviewed by Aura Leeds, RN (Registered Nurse) on 04/26/24 at 1026  Med List Status: <None>   Medication Order Taking? Sig Documenting Provider Last Dose Status Informant  amphotericin B liposome in dextrose  5 % 500 mL [id number] Yes Inject into the vein daily. Receiving at [facility] for 14 days [provider] Taking Active Other  aspirin  EC 81 MG tablet [id number] Yes Take 81 mg by mouth in the morning. Swallow whole. [provider] Taking Active Self, Pharmacy Records  atorvastatin  (LIPITOR) 20 MG tablet [id number] Yes TAKE 1 TABLET (20 MG TOTAL) BY MOUTH DAILY. Lawrance Presume, MD Taking Active Self, Pharmacy Records  b complex-vitamin c-folic acid  (NEPHRO-VITE) 0.8 MG TABS tablet [id number] No Take 1 tablet by mouth in the morning.  Patient not taking: Reported on 11/28/2022   [provider] Not Taking Active Self, Pharmacy Records  calcium  carbonate (TUMS EX) 750 MG chewable tablet [id number] Yes Chew 1 tablet by mouth daily. [provider] Taking Active   flucytosine (ANCOBON) 250 MG capsule [id number] Yes Take 2,250 mg by mouth every 6 (six) hours. [provider] Taking Active Pharmacy Records           Med Note (Mylei Brackeen A   Tue Apr 26, 2024 10:20 AM) Taking for 10 days  furosemide (LASIX) 20 MG tablet [id number] Yes Take 20 mg by mouth daily. [provider] Taking Active Self  heparin  1000 unit/ml SOLN injection [id number] No Inject into the peritoneum as needed.  Patient not taking: Reported on 11/28/2022   [provider] Not Taking Active Self, Pharmacy Records  hydrochlorothiazide  (MICROZIDE ) 12.5 MG capsule [id number] No Take 12.5 mg by mouth daily.  Patient not taking: Reported on 04/26/2024   [provider] Not Taking Active   irbesartan (AVAPRO) 75 MG tablet [id number] Yes Take 75 mg by mouth daily. [provider] Taking Active Self  iron polysaccharides (NIFEREX) 150 MG capsule [id number] No Take 150 mg by mouth daily.  Patient not taking: Reported on 04/26/2024   [provider] Not Taking Active Self, Pharmacy Records    Discontinued 06/26/21 1604 (Entry Error)            Med Note>> Marcus Sewer., [facility]   06/26/2021  4:04 PM conversion error    labetalol  (NORMODYNE ) 100 MG tablet [id number] Yes Take 100 mg by mouth See admin instructions. Take 1 tablet (100 mg) by mouth twice daily on Sundays, Tuesdays, Thursdays & Saturdays. Take 1 tablet (100 mg) by mouth once in the evening on Mondays, Wednesdays, & Fridays. [provider] Taking Active Self, Pharmacy Records           Med Note (Maud Rubendall A   Tue Apr 26, 2024 10:26 AM) Taking 400 mg twice daily  magnesium oxide (MAG-OX) 400 MG tablet [id number] No Take 800 mg by mouth daily. Take 2 tablets (800 mg total) by mouth daily. Take 2  hours before or after myforitc  Patient not taking: Reported on 04/26/2024   [provider] Not Taking Active Self, Pharmacy Records  methylPREDNISolone sodium succinate in sodium chloride  0.9 % 50 mL [id number] No Inject into the vein once.  Patient not taking: Reported on 11/28/2022   [provider] Not Taking Active Self, Pharmacy Records  mycophenolate (MYFORTIC) 180 MG EC tablet [id number] No Take 180 mg by mouth daily.  Patient not taking: Reported on 04/26/2024   [provider] Not Taking Active Self, Pharmacy Records           Med Note (Jeremih Dearmas A   Tue Apr 26, 2024 10:23 AM) Paused on 04/25/24  pantoprazole (PROTONIX) 40 MG tablet [id number]  Take 40 mg by mouth daily.  Patient not taking: Reported on 01/08/2023   [provider]  Expired 02/19/23 2359 Self, Pharmacy Records  patiromer [facility]) 8.4 g packet [id number] Yes Take 8.4 g by mouth daily. [provider] Taking Active   predniSONE (DELTASONE) 5 MG tablet [id number] Yes Take 5 mg by mouth daily. [provider] Taking Active Self, Pharmacy Records           Med Note (Baxter Gonzalez A   Tue Apr 26, 2024 10:18 AM) Taking 10mg  daily  sevelamer (RENAGEL) 800 MG tablet [id number] No Take 1,600-3,200 mg by mouth See admin instructions. Take 4 tablets (3200 mg) by mouth 3 times daily with each meal & take 2 tablet (1600 mg) by mouth with a snack twice daily  Patient not taking: Reported on 11/28/2022   [provider] Not Taking Active Self, Pharmacy Records  sulfamethoxazole-trimethoprim (BACTRIM) 400-80 MG tablet [id number] No Take 1 tablet by mouth 3 (three) times a week. Mondays, Wednesdays, Fridays  Patient not taking: Reported on 04/26/2024   [provider] Not Taking Active Self, Pharmacy Records  tacrolimus  (PROGRAF ) 1 MG capsule [id number] No Take 4 mg by mouth 2 (two) times daily.  Patient not taking: Reported on 04/26/2024   [provider] Not Taking  Active Self, Pharmacy Records           Med Note (Aileana Hodder A   Tue Apr 26, 2024 10:24 AM) Paused on 04/25/24  traMADol  (ULTRAM ) 50 MG tablet [id number] No Take 1 tablet (50 mg total) by mouth every 8 (eight) hours as needed for up to 7 days for Moderate pain (4-6).  Patient not taking: Reported on 11/05/2021    Not Taking Active Self, Pharmacy Records  valGANciclovir (VALCYTE) 450 MG tablet [id number] No Take 450 mg by mouth daily.  Patient not taking: Reported on 01/08/2023   [provider] Not Taking Active Self, Pharmacy Records  Vitamin D, Ergocalciferol, (DRISDOL) 1.25 MG (50000 UNIT) CAPS capsule [id number] Yes Take 50,000 Units by mouth every 7 (seven) days. [provider] Taking Active             Home Care and Equipment/Supplies: Were Home Health Services Ordered?: No Any new equipment or medical supplies ordered?: No  Functional Questionnaire: Do you need assistance with bathing/showering or dressing?: No Do you need assistance with meal preparation?: No Do you  need assistance with eating?: No Do you have difficulty maintaining continence: No Do you need assistance with getting out of bed/getting out of a chair/moving?: No Do you have difficulty managing or taking your medications?: No  Follow up appointments reviewed: PCP Follow-up appointment confirmed?: No (Patient prefers to call and schedule the appointment, provided with [phone]) Specialist Hospital Follow-up appointment confirmed?: Yes Date of Specialist follow-up appointment?: 04/27/24 Follow-Up Specialty Provider:: [facility] Do you need transportation to your follow-up appointment?: No Do you understand care options if your condition(s) worsen?: Yes-patient verbalized understanding  SDOH Interventions Today    Flowsheet Row Most Recent Value  SDOH Interventions   Food Insecurity Interventions Intervention Not Indicated  Housing Interventions Intervention Not Indicated   Transportation Interventions Intervention Not Indicated  Utilities Interventions Intervention Not Indicated       Goals Addressed             This Visit's Progress    VBCI Transitions of Care (TOC) Care Plan       Problems:  Recent Hospitalization for treatment of Crytococcus No Hospital Follow Up Provider appointment Patient has not seen PCP in over 2 years and would like to call and schedule himself  Goal:  Over the next 30 days, the patient will not experience hospital readmission  Interventions:  Transitions of Care: Doctor Visits  - discussed the importance of doctor visits Reviewed Signs and symptoms of infection Verified patient has been set up for daily infusions-[facility] is [facility] and will go for 14 days Offered to assist with scheduling PCP visit-patient will call to schedule and coordinate around his treatment schedule  Patient Self Care Activities:  Attend all scheduled provider appointments Call provider office for new concerns or questions  Participate in Transition of Care Program/Attend [facility] scheduled calls Take medications as prescribed    Plan:  Telephone follow up appointment with care management team member scheduled for:  05/04/24 at 10:30am         Arna Better RN, BSN [place]  [facility] RN Care Manager [phone]

## 2024-04-27 DIAGNOSIS — Z940 Kidney transplant status: Secondary | ICD-10-CM | POA: Diagnosis not present

## 2024-04-27 DIAGNOSIS — B451 Cerebral cryptococcosis: Secondary | ICD-10-CM | POA: Diagnosis not present

## 2024-04-28 DIAGNOSIS — Z940 Kidney transplant status: Secondary | ICD-10-CM | POA: Diagnosis not present

## 2024-04-28 DIAGNOSIS — B451 Cerebral cryptococcosis: Secondary | ICD-10-CM | POA: Diagnosis not present

## 2024-04-29 DIAGNOSIS — Z940 Kidney transplant status: Secondary | ICD-10-CM | POA: Diagnosis not present

## 2024-04-29 DIAGNOSIS — B451 Cerebral cryptococcosis: Secondary | ICD-10-CM | POA: Diagnosis not present

## 2024-04-30 DIAGNOSIS — B451 Cerebral cryptococcosis: Secondary | ICD-10-CM | POA: Diagnosis not present

## 2024-04-30 DIAGNOSIS — Z940 Kidney transplant status: Secondary | ICD-10-CM | POA: Diagnosis not present

## 2024-05-01 DIAGNOSIS — Z940 Kidney transplant status: Secondary | ICD-10-CM | POA: Diagnosis not present

## 2024-05-01 DIAGNOSIS — B451 Cerebral cryptococcosis: Secondary | ICD-10-CM | POA: Diagnosis not present

## 2024-05-02 DIAGNOSIS — Z4822 Encounter for aftercare following kidney transplant: Secondary | ICD-10-CM | POA: Diagnosis not present

## 2024-05-02 DIAGNOSIS — D631 Anemia in chronic kidney disease: Secondary | ICD-10-CM | POA: Diagnosis not present

## 2024-05-02 DIAGNOSIS — Z7952 Long term (current) use of systemic steroids: Secondary | ICD-10-CM | POA: Diagnosis not present

## 2024-05-02 DIAGNOSIS — I129 Hypertensive chronic kidney disease with stage 1 through stage 4 chronic kidney disease, or unspecified chronic kidney disease: Secondary | ICD-10-CM | POA: Diagnosis not present

## 2024-05-02 DIAGNOSIS — Z940 Kidney transplant status: Secondary | ICD-10-CM | POA: Diagnosis not present

## 2024-05-02 DIAGNOSIS — N189 Chronic kidney disease, unspecified: Secondary | ICD-10-CM | POA: Diagnosis not present

## 2024-05-02 DIAGNOSIS — Z79899 Other long term (current) drug therapy: Secondary | ICD-10-CM | POA: Diagnosis not present

## 2024-05-02 DIAGNOSIS — B451 Cerebral cryptococcosis: Secondary | ICD-10-CM | POA: Diagnosis not present

## 2024-05-02 DIAGNOSIS — Z792 Long term (current) use of antibiotics: Secondary | ICD-10-CM | POA: Diagnosis not present

## 2024-05-03 DIAGNOSIS — B459 Cryptococcosis, unspecified: Secondary | ICD-10-CM | POA: Diagnosis not present

## 2024-05-03 DIAGNOSIS — B451 Cerebral cryptococcosis: Secondary | ICD-10-CM | POA: Diagnosis not present

## 2024-05-03 DIAGNOSIS — Z940 Kidney transplant status: Secondary | ICD-10-CM | POA: Diagnosis not present

## 2024-05-03 DIAGNOSIS — Z79899 Other long term (current) drug therapy: Secondary | ICD-10-CM | POA: Diagnosis not present

## 2024-05-04 ENCOUNTER — Encounter: Payer: Self-pay | Admitting: *Deleted

## 2024-05-04 ENCOUNTER — Telehealth: Payer: Self-pay | Admitting: *Deleted

## 2024-05-04 DIAGNOSIS — Z79621 Long term (current) use of calcineurin inhibitor: Secondary | ICD-10-CM | POA: Diagnosis not present

## 2024-05-04 DIAGNOSIS — Z79624 Long term (current) use of inhibitors of nucleotide synthesis: Secondary | ICD-10-CM | POA: Diagnosis not present

## 2024-05-04 DIAGNOSIS — Z940 Kidney transplant status: Secondary | ICD-10-CM | POA: Diagnosis not present

## 2024-05-04 DIAGNOSIS — B451 Cerebral cryptococcosis: Secondary | ICD-10-CM | POA: Diagnosis not present

## 2024-05-04 DIAGNOSIS — Z7952 Long term (current) use of systemic steroids: Secondary | ICD-10-CM | POA: Diagnosis not present

## 2024-05-05 ENCOUNTER — Encounter: Payer: Self-pay | Admitting: *Deleted

## 2024-05-05 DIAGNOSIS — B451 Cerebral cryptococcosis: Secondary | ICD-10-CM | POA: Diagnosis not present

## 2024-05-05 DIAGNOSIS — Z79899 Other long term (current) drug therapy: Secondary | ICD-10-CM | POA: Diagnosis not present

## 2024-05-05 DIAGNOSIS — Z940 Kidney transplant status: Secondary | ICD-10-CM | POA: Diagnosis not present

## 2024-05-06 ENCOUNTER — Encounter: Payer: Self-pay | Admitting: *Deleted

## 2024-05-18 DIAGNOSIS — B348 Other viral infections of unspecified site: Secondary | ICD-10-CM | POA: Diagnosis not present

## 2024-05-18 DIAGNOSIS — Z8619 Personal history of other infectious and parasitic diseases: Secondary | ICD-10-CM | POA: Diagnosis not present

## 2024-05-18 DIAGNOSIS — B259 Cytomegaloviral disease, unspecified: Secondary | ICD-10-CM | POA: Diagnosis not present

## 2024-05-18 DIAGNOSIS — Z940 Kidney transplant status: Secondary | ICD-10-CM | POA: Diagnosis not present

## 2024-05-18 DIAGNOSIS — Z4822 Encounter for aftercare following kidney transplant: Secondary | ICD-10-CM | POA: Diagnosis not present

## 2024-05-18 DIAGNOSIS — Z79899 Other long term (current) drug therapy: Secondary | ICD-10-CM | POA: Diagnosis not present

## 2024-05-18 DIAGNOSIS — D849 Immunodeficiency, unspecified: Secondary | ICD-10-CM | POA: Diagnosis not present

## 2024-05-30 ENCOUNTER — Ambulatory Visit: Admitting: Internal Medicine

## 2024-06-01 DIAGNOSIS — N189 Chronic kidney disease, unspecified: Secondary | ICD-10-CM | POA: Diagnosis not present

## 2024-06-01 DIAGNOSIS — D631 Anemia in chronic kidney disease: Secondary | ICD-10-CM | POA: Diagnosis not present

## 2024-06-01 DIAGNOSIS — N1832 Chronic kidney disease, stage 3b: Secondary | ICD-10-CM | POA: Diagnosis not present

## 2024-06-01 DIAGNOSIS — R809 Proteinuria, unspecified: Secondary | ICD-10-CM | POA: Diagnosis not present

## 2024-06-01 DIAGNOSIS — Z940 Kidney transplant status: Secondary | ICD-10-CM | POA: Diagnosis not present

## 2024-06-01 DIAGNOSIS — B451 Cerebral cryptococcosis: Secondary | ICD-10-CM | POA: Diagnosis not present

## 2024-06-01 DIAGNOSIS — Z79899 Other long term (current) drug therapy: Secondary | ICD-10-CM | POA: Diagnosis not present

## 2024-06-01 DIAGNOSIS — N2581 Secondary hyperparathyroidism of renal origin: Secondary | ICD-10-CM | POA: Diagnosis not present

## 2024-06-01 DIAGNOSIS — I129 Hypertensive chronic kidney disease with stage 1 through stage 4 chronic kidney disease, or unspecified chronic kidney disease: Secondary | ICD-10-CM | POA: Diagnosis not present

## 2024-06-02 ENCOUNTER — Encounter: Payer: Self-pay | Admitting: Family Medicine

## 2024-06-02 ENCOUNTER — Ambulatory Visit (INDEPENDENT_AMBULATORY_CARE_PROVIDER_SITE_OTHER): Admitting: Family Medicine

## 2024-06-02 VITALS — BP 157/83 | HR 58 | Ht 66.75 in | Wt 209.2 lb

## 2024-06-02 DIAGNOSIS — I10 Essential (primary) hypertension: Secondary | ICD-10-CM

## 2024-06-02 DIAGNOSIS — Z940 Kidney transplant status: Secondary | ICD-10-CM | POA: Diagnosis not present

## 2024-06-02 DIAGNOSIS — E66811 Obesity, class 1: Secondary | ICD-10-CM | POA: Diagnosis not present

## 2024-06-02 DIAGNOSIS — E6609 Other obesity due to excess calories: Secondary | ICD-10-CM

## 2024-06-02 DIAGNOSIS — Z6833 Body mass index (BMI) 33.0-33.9, adult: Secondary | ICD-10-CM | POA: Diagnosis not present

## 2024-06-02 DIAGNOSIS — Z7689 Persons encountering health services in other specified circumstances: Secondary | ICD-10-CM

## 2024-06-02 LAB — LAB REPORT - SCANNED
A1c: 5.2
Creatinine, POC: 88 mg/dL
EGFR: 25

## 2024-06-02 NOTE — Progress Notes (Signed)
 New Patient Office Visit  Subjective    Patient ID: Fred Harrison, male    DOB: 01-21-1966  Age: 58 y.o. MRN: [id number]  CC:  Chief Complaint  Patient presents with   New Patient (Initial Visit)    HPI Fred Harrison presents to establish care and for review of chronic med issues. Patient reports that he has hypertension and that 2 years ago he had a kidney transplant with a cadaver kidney. Patient    Outpatient Encounter Medications as of 06/02/2024  Medication Sig   aspirin  EC 81 MG tablet Take 81 mg by mouth in the morning. Swallow whole.   atorvastatin  (LIPITOR) 20 MG tablet TAKE 1 TABLET (20 MG TOTAL) BY MOUTH DAILY.   fluconazole (DIFLUCAN) 200 MG tablet Take 400 mg by mouth.   furosemide (LASIX) 20 MG tablet Take 20 mg by mouth daily.   irbesartan (AVAPRO) 75 MG tablet Take 75 mg by mouth daily.   labetalol  (NORMODYNE ) 100 MG tablet Take 100 mg by mouth See admin instructions. Take 1 tablet (100 mg) by mouth twice daily on Sundays, Tuesdays, Thursdays & Saturdays. Take 1 tablet (100 mg) by mouth once in the evening on Mondays, Wednesdays, & Fridays.   magnesium oxide (MAG-OX) 400 (240 Mg) MG tablet Take 1 tablet by mouth 2 (two) times daily.   pantoprazole (PROTONIX) 40 MG tablet Take 40 mg by mouth daily.   predniSONE (DELTASONE) 5 MG tablet Take 5 mg by mouth 2 (two) times daily with a meal.   sulfamethoxazole-trimethoprim (BACTRIM) 400-80 MG tablet Take 1 tablet by mouth.   Vitamin D, Ergocalciferol, (DRISDOL) 1.25 MG (50000 UNIT) CAPS capsule Take 1,250 Units by mouth.   amphotericin B  liposome in dextrose  5 % 500 mL Inject into the vein daily. Receiving [facility]   calcium  carbonate (TUMS EX) 750 MG chewable tablet Chew 1 tablet by mouth daily.   flucytosine (ANCOBON) 250 MG capsule Take 2,250 mg by mouth every 6 (six) hours.   hydrochlorothiazide  (MICROZIDE ) 12.5 MG capsule Take 12.5 mg by mouth daily. (Patient not taking: Reported on 04/26/2024)    iron polysaccharides (NIFEREX) 150 MG capsule Take 150 mg by mouth daily. (Patient not taking: Reported on 04/26/2024)   labetalol  (NORMODYNE ) 200 MG tablet Take 200 mg by mouth 2 (two) times daily.   magnesium oxide (MAG-OX) 400 MG tablet Take 800 mg by mouth daily. Take 2 tablets (800 mg total) by mouth daily. Take 2 hours before or after myforitc (Patient not taking: Reported on 04/26/2024)   methylPREDNISolone sodium succinate in sodium chloride  0.9 % 50 mL Inject into the vein once. (Patient not taking: Reported on 11/28/2022)   mycophenolate (MYFORTIC) 180 MG EC tablet Take 180 mg by mouth daily. (Patient not taking: Reported on 04/26/2024)   patiromer (VELTASSA) 8.4 g packet Take 8.4 g by mouth daily.   predniSONE (DELTASONE) 5 MG tablet Take 5 mg by mouth daily.   sevelamer (RENAGEL) 800 MG tablet Take 1,600-3,200 mg by mouth See admin instructions. Take 4 tablets (3200 mg) by mouth 3 times daily with each meal & take 2 tablet (1600 mg) by mouth with a snack twice daily (Patient not taking: Reported on 11/28/2022)   sulfamethoxazole-trimethoprim (BACTRIM) 400-80 MG tablet Take 1 tablet by mouth 3 (three) times a week. Mondays, Wednesdays, Fridays (Patient not taking: Reported on 04/26/2024)   tacrolimus  (PROGRAF ) 1 MG capsule Take 4 mg by mouth 2 (two) times daily. (Patient not taking: Reported on 04/26/2024)  traMADol  (ULTRAM ) 50 MG tablet Take 1 tablet (50 mg total) by mouth every 8 (eight) hours as needed for up to 7 days for Moderate pain (4-6). (Patient not taking: Reported on 11/05/2021)   valGANciclovir (VALCYTE) 450 MG tablet Take 450 mg by mouth daily. (Patient not taking: Reported on 01/08/2023)   Vitamin D, Ergocalciferol, (DRISDOL) 1.25 MG (50000 UNIT) CAPS capsule Take 50,000 Units by mouth every 7 (seven) days.   [DISCONTINUED] b complex-vitamin c-folic acid  (NEPHRO-VITE) 0.8 MG TABS tablet Take 1 tablet by mouth in the morning. (Patient not taking: Reported on 11/28/2022)   [DISCONTINUED]  heparin  1000 unit/ml SOLN injection Inject into the peritoneum as needed. (Patient not taking: Reported on 11/28/2022)   [DISCONTINUED] isosorbide  mononitrate (IMDUR ) 30 MG 24 hr tablet TAKE 1 TABLET (30 MG TOTAL) BY MOUTH DAILY.   No facility-administered encounter medications on file as of 06/02/2024.    Past Medical History:  Diagnosis Date   Chronic kidney disease M-W-F   hemodialysis    Hypertension    TIA (transient ischemic attack) 2013,2015    Past Surgical History:  Procedure Laterality Date   A/V FISTULAGRAM N/A 12/13/2019   Procedure: A/V FISTULAGRAM - Left Arm;  Surgeon: Serene Gaile ORN, MD;  Location: [facility]VASIVE [facility];  Service: Cardiovascular;  Laterality: N/A;   AV FISTULA PLACEMENT Left 09/16/2019   Procedure: ARTERIOVENOUS (AV) FISTULA CREATION;  Surgeon: Harvey Carlin BRAVO, MD;  Location: [facility] OR;  Service: Vascular;  Laterality: Left;   COLONOSCOPY WITH PROPOFOL  N/A 01/20/2022   Procedure: COLONOSCOPY WITH PROPOFOL ;  Surgeon: Wilhelmenia Aloha Raddle., MD;  Location: [facility] ENDOSCOPY;  Service: Gastroenterology;  Laterality: N/A;   CYST REMOVED     FROM HEAD   IR FLUORO GUIDE CV LINE RIGHT  09/14/2019   IR US  GUIDE VASC ACCESS RIGHT  09/14/2019   KIDNEY TRANSPLANT Right    LIGATION OF COMPETING BRANCHES OF ARTERIOVENOUS FISTULA Left 12/13/2019   Procedure: BRANCH LIGATION OF LEFT ARTERIOVENOUS FISTULA;  Surgeon: Eliza Lonni RAMAN, MD;  Location: [facility] OR;  Service: Vascular;  Laterality: Left;    Family History  Problem Relation Age of Onset   Stroke Mother    Hypertension Mother    Heart attack Father    Cancer Sister    Colon cancer Neg Hx    Colon polyps Neg Hx    Esophageal cancer Neg Hx    Stomach cancer Neg Hx    Rectal cancer Neg Hx     Social History   Socioeconomic History   Marital status: Divorced    Spouse name: Not on file   Number of children: Not on file   Years of education: Not on file   Highest education level: 12th grade  Occupational  History   Not on file  Tobacco Use   Smoking status: Former    Current packs/day: 0.00    Types: Cigarettes    Quit date: 07/02/2016    Years since quitting: 7.9   Smokeless tobacco: Never  Vaping Use   Vaping status: Never Used  Substance and Sexual Activity   Alcohol use: No   Drug use: No   Sexual activity: Not on file  Other Topics Concern   Not on file  Social History Narrative   Not on file   Social Drivers of Health   Financial Resource Strain: Low Risk  (06/01/2024)   Overall Financial Resource Strain (CARDIA)    Difficulty of Paying Living Expenses: Not very hard  Food Insecurity: No Food  Insecurity (06/01/2024)   Hunger Vital Sign    Worried About Running Out of Food in the Last Year: Never true    Ran Out of Food in the Last Year: Never true  Transportation Needs: No Transportation Needs (06/01/2024)   PRAPARE - Administrator, Civil Service (Medical): No    Lack of Transportation (Non-Medical): No  Physical Activity: Sufficiently Active (06/01/2024)   Exercise Vital Sign    Days of Exercise per Week: 5 days    Minutes of Exercise per Session: 70 min  Stress: No Stress Concern Present (06/01/2024)   [employer] of Occupational Health - Occupational Stress Questionnaire    Feeling of Stress: Not at all  Social Connections: Socially Isolated (06/01/2024)   Social Connection and Isolation Panel    Frequency of Communication with Friends and Family: More than three times a week    Frequency of Social Gatherings with Friends and Family: Patient declined    Attends Religious Services: Never    Database administrator or Organizations: No    Attends Engineer, structural: Not on file    Marital Status: Divorced  Intimate Partner Violence: Not At Risk (04/26/2024)   Humiliation, Afraid, Rape, and Kick questionnaire    Fear of Current or Ex-Partner: No    Emotionally Abused: No    Physically Abused: No    Sexually Abused: No    Review of Systems  All  other systems reviewed and are negative.       Objective   BP (!) 157/83   Pulse (!) 58   Ht 5' 6.75 (1.695 m)   Wt 209 lb 3.2 oz (94.9 kg)   SpO2 96%   BMI 33.01 kg/m   Physical Exam Vitals and nursing note reviewed.  Constitutional:      General: He is not in acute distress. Cardiovascular:     Rate and Rhythm: Normal rate and regular rhythm.  Pulmonary:     Effort: Pulmonary effort is normal.     Breath sounds: Normal breath sounds.  Abdominal:     Palpations: Abdomen is soft.     Tenderness: There is no abdominal tenderness.  Neurological:     General: No focal deficit present.     Mental Status: He is alert and oriented to person, place, and time.         Assessment & Plan:  1. Essential hypertension (Primary) Elevated reading.   2. H/O kidney transplant   3. Class 1 obesity due to excess calories with serious comorbidity and body mass index (BMI) of 33.0 to 33.9 in adult   4. Encounter to establish care    Return in about 3 months (around 09/02/2024) for follow up.   Tanda Raguel SQUIBB, MD

## 2024-06-02 NOTE — Progress Notes (Deleted)
 Established Patient Office Visit  Subjective    Patient ID: Fred Harrison, male    DOB: 03-22-1966  Age: 58 y.o. MRN: [id number]  CC:  Chief Complaint  Patient presents with   New Patient (Initial Visit)    HPI Fred Harrison presents ***  Outpatient Encounter Medications as of 06/02/2024  Medication Sig   aspirin  EC 81 MG tablet Take 81 mg by mouth in the morning. Swallow whole.   atorvastatin  (LIPITOR) 20 MG tablet TAKE 1 TABLET (20 MG TOTAL) BY MOUTH DAILY.   fluconazole (DIFLUCAN) 200 MG tablet Take 400 mg by mouth.   furosemide (LASIX) 20 MG tablet Take 20 mg by mouth daily.   irbesartan (AVAPRO) 75 MG tablet Take 75 mg by mouth daily.   labetalol  (NORMODYNE ) 100 MG tablet Take 100 mg by mouth See admin instructions. Take 1 tablet (100 mg) by mouth twice daily on Sundays, Tuesdays, Thursdays & Saturdays. Take 1 tablet (100 mg) by mouth once in the evening on Mondays, Wednesdays, & Fridays.   magnesium oxide (MAG-OX) 400 (240 Mg) MG tablet Take 1 tablet by mouth 2 (two) times daily.   pantoprazole (PROTONIX) 40 MG tablet Take 40 mg by mouth daily.   predniSONE (DELTASONE) 5 MG tablet Take 5 mg by mouth 2 (two) times daily with a meal.   sulfamethoxazole-trimethoprim (BACTRIM) 400-80 MG tablet Take 1 tablet by mouth.   Vitamin D, Ergocalciferol, (DRISDOL) 1.25 MG (50000 UNIT) CAPS capsule Take 1,250 Units by mouth.   amphotericin B  liposome in dextrose  5 % 500 mL Inject into the vein daily. Receiving at [facility] for 14 days   calcium  carbonate (TUMS EX) 750 MG chewable tablet Chew 1 tablet by mouth daily.   flucytosine (ANCOBON) 250 MG capsule Take 2,250 mg by mouth every 6 (six) hours.   hydrochlorothiazide  (MICROZIDE ) 12.5 MG capsule Take 12.5 mg by mouth daily. (Patient not taking: Reported on 04/26/2024)   iron polysaccharides (NIFEREX) 150 MG capsule Take 150 mg by mouth daily. (Patient not taking: Reported on 04/26/2024)   labetalol  (NORMODYNE ) 200 MG tablet Take 200 mg  by mouth 2 (two) times daily.   magnesium oxide (MAG-OX) 400 MG tablet Take 800 mg by mouth daily. Take 2 tablets (800 mg total) by mouth daily. Take 2 hours before or after myforitc (Patient not taking: Reported on 04/26/2024)   methylPREDNISolone sodium succinate in sodium chloride  0.9 % 50 mL Inject into the vein once. (Patient not taking: Reported on 11/28/2022)   mycophenolate (MYFORTIC) 180 MG EC tablet Take 180 mg by mouth daily. (Patient not taking: Reported on 04/26/2024)   patiromer (VELTASSA) 8.4 g packet Take 8.4 g by mouth daily.   predniSONE (DELTASONE) 5 MG tablet Take 5 mg by mouth daily.   sevelamer (RENAGEL) 800 MG tablet Take 1,600-3,200 mg by mouth See admin instructions. Take 4 tablets (3200 mg) by mouth 3 times daily with each meal & take 2 tablet (1600 mg) by mouth with a snack twice daily (Patient not taking: Reported on 11/28/2022)   sulfamethoxazole-trimethoprim (BACTRIM) 400-80 MG tablet Take 1 tablet by mouth 3 (three) times a week. Mondays, Wednesdays, Fridays (Patient not taking: Reported on 04/26/2024)   tacrolimus  (PROGRAF ) 1 MG capsule Take 4 mg by mouth 2 (two) times daily. (Patient not taking: Reported on 04/26/2024)   traMADol  (ULTRAM ) 50 MG tablet Take 1 tablet (50 mg total) by mouth every 8 (eight) hours as needed for up to 7 days for Moderate pain (4-6). (Patient not taking:  Reported on 11/05/2021)   valGANciclovir (VALCYTE) 450 MG tablet Take 450 mg by mouth daily. (Patient not taking: Reported on 01/08/2023)   Vitamin D, Ergocalciferol, (DRISDOL) 1.25 MG (50000 UNIT) CAPS capsule Take 50,000 Units by mouth every 7 (seven) days.   [DISCONTINUED] b complex-vitamin c-folic acid  (NEPHRO-VITE) 0.8 MG TABS tablet Take 1 tablet by mouth in the morning. (Patient not taking: Reported on 11/28/2022)   [DISCONTINUED] heparin  1000 unit/ml SOLN injection Inject into the peritoneum as needed. (Patient not taking: Reported on 11/28/2022)   [DISCONTINUED] isosorbide  mononitrate (IMDUR ) 30 MG 24  hr tablet TAKE 1 TABLET (30 MG TOTAL) BY MOUTH DAILY.   No facility-administered encounter medications on file as of 06/02/2024.    Past Medical History:  Diagnosis Date   Chronic kidney disease M-W-F   hemodialysis    Hypertension    TIA (transient ischemic attack) 2013,2015    Past Surgical History:  Procedure Laterality Date   A/V FISTULAGRAM N/A 12/13/2019   Procedure: A/V FISTULAGRAM - Left Arm;  Surgeon: Serene Gaile ORN, MD;  Location: [facility];  Service: Cardiovascular;  Laterality: N/A;   AV FISTULA PLACEMENT Left 09/16/2019   Procedure: ARTERIOVENOUS (AV) FISTULA CREATION;  Surgeon: Harvey Carlin BRAVO, MD;  Location: [facility] OR;  Service: Vascular;  Laterality: Left;   COLONOSCOPY WITH PROPOFOL  N/A 01/20/2022   Procedure: COLONOSCOPY WITH PROPOFOL ;  Surgeon: Wilhelmenia Aloha Raddle., MD;  Location: [facility]OSCOPY;  Service: Gastroenterology;  Laterality: N/A;   CYST REMOVED     FROM HEAD   IR FLUORO GUIDE CV LINE RIGHT  09/14/2019   IR US  GUIDE VASC ACCESS RIGHT  09/14/2019   KIDNEY TRANSPLANT Right    LIGATION OF COMPETING BRANCHES OF ARTERIOVENOUS FISTULA Left 12/13/2019   Procedure: BRANCH LIGATION OF LEFT ARTERIOVENOUS FISTULA;  Surgeon: Eliza Lonni RAMAN, MD;  Location: [facility] OR;  Service: Vascular;  Laterality: Left;    Family History  Problem Relation Age of Onset   Stroke Mother    Hypertension Mother    Heart attack Father    Cancer Sister    Colon cancer Neg Hx    Colon polyps Neg Hx    Esophageal cancer Neg Hx    Stomach cancer Neg Hx    Rectal cancer Neg Hx     Social History   Socioeconomic History   Marital status: Divorced    Spouse name: Not on file   Number of children: Not on file   Years of education: Not on file   Highest education level: 12th grade  Occupational History   Not on file  Tobacco Use   Smoking status: Former    Current packs/day: 0.00    Types: Cigarettes    Quit date: 07/02/2016    Years since quitting: 7.9    Smokeless tobacco: Never  Vaping Use   Vaping status: Never Used  Substance and Sexual Activity   Alcohol use: No   Drug use: No   Sexual activity: Not on file  Other Topics Concern   Not on file  Social History Narrative   Not on file   Social Drivers of Health   Financial Resource Strain: Low Risk  (06/01/2024)   Overall Financial Resource Strain (CARDIA)    Difficulty of Paying Living Expenses: Not very hard  Food Insecurity: No Food Insecurity (06/01/2024)   Hunger Vital Sign    Worried About Running Out of Food in the Last Year: Never true    Ran Out of Food in the  Last Year: Never true  Transportation Needs: No Transportation Needs (06/01/2024)   PRAPARE - Administrator, Civil Service (Medical): No    Lack of Transportation (Non-Medical): No  Physical Activity: Sufficiently Active (06/01/2024)   Exercise Vital Sign    Days of Exercise per Week: 5 days    Minutes of Exercise per Session: 70 min  Stress: No Stress Concern Present (06/01/2024)   [employer] of Occupational Health - Occupational Stress Questionnaire    Feeling of Stress: Not at all  Social Connections: Socially Isolated (06/01/2024)   Social Connection and Isolation Panel    Frequency of Communication with Friends and Family: More than three times a week    Frequency of Social Gatherings with Friends and Family: Patient declined    Attends Religious Services: Never    Database administrator or Organizations: No    Attends Engineer, structural: Not on file    Marital Status: Divorced  Intimate Partner Violence: Not At Risk (04/26/2024)   Humiliation, Afraid, Rape, and Kick questionnaire    Fear of Current or Ex-Partner: No    Emotionally Abused: No    Physically Abused: No    Sexually Abused: No    ROS      Objective    BP (!) 157/83   Pulse (!) 58   Ht 5' 6.75 (1.695 m)   Wt 209 lb 3.2 oz (94.9 kg)   SpO2 96%   BMI 33.01 kg/m   Physical Exam  {Labs (Optional):23779}     Assessment & Plan:   Encounter to establish care  Essential hypertension  ESRD on hemodialysis (HCC)     No follow-ups on file.   Tanda Raguel SQUIBB, MD

## 2024-06-09 ENCOUNTER — Encounter: Payer: Self-pay | Admitting: Nephrology

## 2024-06-15 DIAGNOSIS — Z940 Kidney transplant status: Secondary | ICD-10-CM | POA: Diagnosis not present

## 2024-06-17 DIAGNOSIS — Z4822 Encounter for aftercare following kidney transplant: Secondary | ICD-10-CM | POA: Diagnosis not present

## 2024-06-17 DIAGNOSIS — Z940 Kidney transplant status: Secondary | ICD-10-CM | POA: Diagnosis not present

## 2024-06-17 DIAGNOSIS — N179 Acute kidney failure, unspecified: Secondary | ICD-10-CM | POA: Diagnosis not present

## 2024-07-12 NOTE — Telephone Encounter (Signed)
 Patient was referred back to Dr. Dolan at [facility] and was seen last month in July 2025. Called and left a VM for Kim Dr. Nicanor clinical assistant to confirm they will be taking over his BELA.   [facility] - [facility] should manage moving forward.   Electronically signed by: Rosina Public, RN 07/12/2024 9:30 AM

## 2024-07-13 DIAGNOSIS — Z940 Kidney transplant status: Secondary | ICD-10-CM | POA: Diagnosis not present

## 2024-07-13 NOTE — Telephone Encounter (Signed)
 Received a call back from [place] at [facility] - states they never received a refer back packet although the patient was seen in July. Will refax refer back packet with BELA dosing information for BELA to be arranged.   Electronically signed by: Rosina Public, RN 07/13/2024 2:35 PM

## 2024-07-13 NOTE — Progress Notes (Signed)
 Patient here for [facility] 24G PIV placed right [facility] without complications. Rollo Lawrance Lockwood, LPN

## 2024-08-10 DIAGNOSIS — Z940 Kidney transplant status: Secondary | ICD-10-CM | POA: Diagnosis not present

## 2024-09-01 ENCOUNTER — Encounter: Payer: Self-pay | Admitting: Family Medicine

## 2024-09-01 ENCOUNTER — Ambulatory Visit (INDEPENDENT_AMBULATORY_CARE_PROVIDER_SITE_OTHER): Admitting: Family Medicine

## 2024-09-01 VITALS — BP 190/98 | HR 68 | Ht 66.75 in | Wt 218.0 lb

## 2024-09-01 DIAGNOSIS — E66811 Obesity, class 1: Secondary | ICD-10-CM

## 2024-09-01 DIAGNOSIS — I10 Essential (primary) hypertension: Secondary | ICD-10-CM

## 2024-09-01 DIAGNOSIS — E6609 Other obesity due to excess calories: Secondary | ICD-10-CM

## 2024-09-01 DIAGNOSIS — Z6834 Body mass index (BMI) 34.0-34.9, adult: Secondary | ICD-10-CM

## 2024-09-01 MED ORDER — IRBESARTAN 75 MG PO TABS: 75.0000 mg | ORAL_TABLET | Freq: Every day | ORAL | 0 refills | Status: AC

## 2024-09-01 NOTE — Progress Notes (Signed)
 Established Patient Office Visit  Subjective    Patient ID: Fred Harrison, male    DOB: 1966-03-21  Age: 58 y.o. MRN: [id number]  CC:  Chief Complaint  Patient presents with   Medical Management of Chronic Issues    3 month follow up. No other concerns     HPI Fred Harrison presents for follow up of hypertension. Patient reports med compliance and denies acute complaints.   Outpatient Encounter Medications as of 09/01/2024  Medication Sig   aspirin  EC 81 MG tablet Take 81 mg by mouth in the morning. Swallow whole.   atorvastatin  (LIPITOR) 20 MG tablet TAKE 1 TABLET (20 MG TOTAL) BY MOUTH DAILY.   fluconazole (DIFLUCAN) 200 MG tablet Take 400 mg by mouth.   magnesium oxide (MAG-OX) 400 (240 Mg) MG tablet Take 1 tablet by mouth 2 (two) times daily.   pantoprazole (PROTONIX) 40 MG tablet Take 40 mg by mouth daily.   [DISCONTINUED] labetalol  (NORMODYNE ) 100 MG tablet Take 100 mg by mouth See admin instructions. Take 1 tablet (100 mg) by mouth twice daily on Sundays, Tuesdays, Thursdays & Saturdays. Take 1 tablet (100 mg) by mouth once in the evening on Mondays, Wednesdays, & Fridays.   [DISCONTINUED] predniSONE (DELTASONE) 5 MG tablet Take 5 mg by mouth daily.   amphotericin B  liposome in dextrose  5 % 500 mL Inject into the vein daily. Receiving at [facility] for 14 days (Patient not taking: Reported on 09/01/2024)   calcium  carbonate (TUMS EX) 750 MG chewable tablet Chew 1 tablet by mouth daily. (Patient not taking: Reported on 09/01/2024)   flucytosine (ANCOBON) 250 MG capsule Take 2,250 mg by mouth every 6 (six) hours. (Patient not taking: Reported on 09/01/2024)   furosemide (LASIX) 20 MG tablet Take 20 mg by mouth daily. (Patient not taking: Reported on 09/01/2024)   irbesartan (AVAPRO) 75 MG tablet Take 1 tablet (75 mg total) by mouth daily.   iron polysaccharides (NIFEREX) 150 MG capsule Take 150 mg by mouth daily. (Patient not taking: Reported on 04/26/2024)   labetalol   (NORMODYNE ) 200 MG tablet Take 200 mg by mouth 2 (two) times daily.   mycophenolate (MYFORTIC) 180 MG EC tablet Take 180 mg by mouth daily. (Patient not taking: Reported on 04/26/2024)   patiromer (VELTASSA) 8.4 g packet Take 8.4 g by mouth daily. (Patient not taking: Reported on 09/01/2024)   predniSONE (DELTASONE) 5 MG tablet Take 5 mg by mouth 2 (two) times daily with a meal. (Patient not taking: Reported on 09/01/2024)   sulfamethoxazole-trimethoprim (BACTRIM) 400-80 MG tablet Take 1 tablet by mouth 3 (three) times a week. Mondays, Wednesdays, Fridays (Patient not taking: Reported on 04/26/2024)   tacrolimus  (PROGRAF ) 1 MG capsule Take 4 mg by mouth 2 (two) times daily. (Patient not taking: Reported on 04/26/2024)   traMADol  (ULTRAM ) 50 MG tablet Take 1 tablet (50 mg total) by mouth every 8 (eight) hours as needed for up to 7 days for Moderate pain (4-6). (Patient not taking: Reported on 11/05/2021)   valGANciclovir (VALCYTE) 450 MG tablet Take 450 mg by mouth daily. (Patient not taking: Reported on 01/08/2023)   Vitamin D, Ergocalciferol, (DRISDOL) 1.25 MG (50000 UNIT) CAPS capsule Take 50,000 Units by mouth every 7 (seven) days. (Patient not taking: Reported on 09/01/2024)   [DISCONTINUED] hydrochlorothiazide  (MICROZIDE ) 12.5 MG capsule Take 12.5 mg by mouth daily. (Patient not taking: Reported on 04/26/2024)   [DISCONTINUED] irbesartan (AVAPRO) 75 MG tablet Take 75 mg by mouth daily. (Patient not taking: Reported on  09/01/2024)   [DISCONTINUED] isosorbide  mononitrate (IMDUR ) 30 MG 24 hr tablet TAKE 1 TABLET (30 MG TOTAL) BY MOUTH DAILY.   [DISCONTINUED] magnesium oxide (MAG-OX) 400 MG tablet Take 800 mg by mouth daily. Take 2 tablets (800 mg total) by mouth daily. Take 2 hours before or after myforitc (Patient not taking: Reported on 04/26/2024)   [DISCONTINUED] sulfamethoxazole-trimethoprim (BACTRIM) 400-80 MG tablet Take 1 tablet by mouth. (Patient not taking: Reported on 09/01/2024)   No  facility-administered encounter medications on file as of 09/01/2024.    Past Medical History:  Diagnosis Date   Chronic kidney disease M-W-F   hemodialysis    Hypertension    TIA (transient ischemic attack) 2013,2015    Past Surgical History:  Procedure Laterality Date   A/V FISTULAGRAM N/A 12/13/2019   Procedure: A/V FISTULAGRAM - Left Arm;  Surgeon: Serene Gaile ORN, MD;  Location: [facility];  Service: Cardiovascular;  Laterality: N/A;   AV FISTULA PLACEMENT Left 09/16/2019   Procedure: ARTERIOVENOUS (AV) FISTULA CREATION;  Surgeon: Harvey Carlin BRAVO, MD;  Location: [facility] OR;  Service: Vascular;  Laterality: Left;   COLONOSCOPY WITH PROPOFOL  N/A 01/20/2022   Procedure: COLONOSCOPY WITH PROPOFOL ;  Surgeon: Wilhelmenia Aloha Raddle., MD;  Location: [facility];  Service: Gastroenterology;  Laterality: N/A;   CYST REMOVED     FROM HEAD   IR FLUORO GUIDE CV LINE RIGHT  09/14/2019   IR US  GUIDE VASC ACCESS RIGHT  09/14/2019   KIDNEY TRANSPLANT Right    LIGATION OF COMPETING BRANCHES OF ARTERIOVENOUS FISTULA Left 12/13/2019   Procedure: BRANCH LIGATION OF LEFT ARTERIOVENOUS FISTULA;  Surgeon: Eliza Lonni RAMAN, MD;  Location: [facility] OR;  Service: Vascular;  Laterality: Left;    Family History  Problem Relation Age of Onset   Stroke Mother    Hypertension Mother    Heart attack Father    Cancer Sister    Colon cancer Neg Hx    Colon polyps Neg Hx    Esophageal cancer Neg Hx    Stomach cancer Neg Hx    Rectal cancer Neg Hx     Social History   Socioeconomic History   Marital status: Divorced    Spouse name: Not on file   Number of children: Not on file   Years of education: Not on file   Highest education level: 12th grade  Occupational History   Not on file  Tobacco Use   Smoking status: Former    Current packs/day: 0.00    Types: Cigarettes    Quit date: 07/02/2016    Years since quitting: 8.1   Smokeless tobacco: Never  Vaping Use   Vaping status: Never  Used  Substance and Sexual Activity   Alcohol use: No   Drug use: No   Sexual activity: Not on file  Other Topics Concern   Not on file  Social History Narrative   Not on file   Social Drivers of Health   Financial Resource Strain: Low Risk  (06/01/2024)   Overall Financial Resource Strain (CARDIA)    Difficulty of Paying Living Expenses: Not very hard  Food Insecurity: No Food Insecurity (06/01/2024)   Hunger Vital Sign    Worried About Running Out of Food in the Last Year: Never true    Ran Out of Food in the Last Year: Never true  Transportation Needs: No Transportation Needs (06/01/2024)   PRAPARE - Administrator, Civil Service (Medical): No    Lack of Transportation (Non-Medical): No  Physical Activity: Sufficiently Active (06/01/2024)   Exercise Vital Sign    Days of Exercise per Week: 5 days    Minutes of Exercise per Session: 70 min  Stress: No Stress Concern Present (06/01/2024)   [employer] of Occupational Health - Occupational Stress Questionnaire    Feeling of Stress: Not at all  Social Connections: Socially Isolated (06/01/2024)   Social Connection and Isolation Panel    Frequency of Communication with Friends and Family: More than three times a week    Frequency of Social Gatherings with Friends and Family: Patient declined    Attends Religious Services: Never    Database administrator or Organizations: No    Attends Engineer, structural: Not on file    Marital Status: Divorced  Intimate Partner Violence: Not At Risk (04/26/2024)   Humiliation, Afraid, Rape, and Kick questionnaire    Fear of Current or Ex-Partner: No    Emotionally Abused: No    Physically Abused: No    Sexually Abused: No    Review of Systems  All other systems reviewed and are negative.       Objective    BP (!) 190/98   Pulse 68   Ht 5' 6.75 (1.695 m)   Wt 218 lb (98.9 kg)   SpO2 94%   BMI 34.40 kg/m   Physical Exam Vitals and nursing note reviewed.   Constitutional:      General: He is not in acute distress. Cardiovascular:     Rate and Rhythm: Normal rate and regular rhythm.  Pulmonary:     Effort: Pulmonary effort is normal.     Breath sounds: Normal breath sounds.  Abdominal:     Palpations: Abdomen is soft.     Tenderness: There is no abdominal tenderness.  Neurological:     General: No focal deficit present.     Mental Status: He is alert and oriented to person, place, and time.         Assessment & Plan:   Essential hypertension  Class 1 obesity due to excess calories with serious comorbidity and body mass index (BMI) of 34.0 to 34.9 in adult  Other orders -     Irbesartan; Take 1 tablet (75 mg total) by mouth daily.  Dispense: 90 tablet; Refill: 0     Return in about 4 weeks (around 09/29/2024) for follow up.   Tanda Raguel SQUIBB, MD

## 2024-09-06 DIAGNOSIS — Z940 Kidney transplant status: Secondary | ICD-10-CM | POA: Diagnosis not present

## 2024-09-06 DIAGNOSIS — Z111 Encounter for screening for respiratory tuberculosis: Secondary | ICD-10-CM | POA: Diagnosis not present

## 2024-09-08 DIAGNOSIS — Z940 Kidney transplant status: Secondary | ICD-10-CM | POA: Diagnosis not present

## 2024-09-21 DIAGNOSIS — N2581 Secondary hyperparathyroidism of renal origin: Secondary | ICD-10-CM | POA: Diagnosis not present

## 2024-09-21 DIAGNOSIS — N189 Chronic kidney disease, unspecified: Secondary | ICD-10-CM | POA: Diagnosis not present

## 2024-09-21 DIAGNOSIS — Z940 Kidney transplant status: Secondary | ICD-10-CM | POA: Diagnosis not present

## 2024-09-28 DIAGNOSIS — R809 Proteinuria, unspecified: Secondary | ICD-10-CM | POA: Diagnosis not present

## 2024-09-28 DIAGNOSIS — E870 Hyperosmolality and hypernatremia: Secondary | ICD-10-CM | POA: Diagnosis not present

## 2024-09-28 DIAGNOSIS — B451 Cerebral cryptococcosis: Secondary | ICD-10-CM | POA: Diagnosis not present

## 2024-09-28 DIAGNOSIS — I129 Hypertensive chronic kidney disease with stage 1 through stage 4 chronic kidney disease, or unspecified chronic kidney disease: Secondary | ICD-10-CM | POA: Diagnosis not present

## 2024-09-28 DIAGNOSIS — D631 Anemia in chronic kidney disease: Secondary | ICD-10-CM | POA: Diagnosis not present

## 2024-09-28 DIAGNOSIS — Z940 Kidney transplant status: Secondary | ICD-10-CM | POA: Diagnosis not present

## 2024-09-28 DIAGNOSIS — N2581 Secondary hyperparathyroidism of renal origin: Secondary | ICD-10-CM | POA: Diagnosis not present

## 2024-09-28 DIAGNOSIS — Z79899 Other long term (current) drug therapy: Secondary | ICD-10-CM | POA: Diagnosis not present

## 2024-09-28 DIAGNOSIS — N1832 Chronic kidney disease, stage 3b: Secondary | ICD-10-CM | POA: Diagnosis not present

## 2024-10-06 DIAGNOSIS — Z940 Kidney transplant status: Secondary | ICD-10-CM | POA: Diagnosis not present

## 2024-10-13 ENCOUNTER — Encounter: Payer: Self-pay | Admitting: Family Medicine

## 2024-10-13 ENCOUNTER — Ambulatory Visit (INDEPENDENT_AMBULATORY_CARE_PROVIDER_SITE_OTHER): Admitting: Family Medicine

## 2024-10-13 VITALS — BP 175/96 | HR 73 | Ht 66.75 in | Wt 222.0 lb

## 2024-10-13 DIAGNOSIS — Z940 Kidney transplant status: Secondary | ICD-10-CM

## 2024-10-13 DIAGNOSIS — I10 Essential (primary) hypertension: Secondary | ICD-10-CM | POA: Diagnosis not present

## 2024-10-13 MED ORDER — AMLODIPINE BESYLATE 5 MG PO TABS
5.0000 mg | ORAL_TABLET | Freq: Every day | ORAL | 0 refills | Status: AC
Start: 2024-10-13 — End: ?

## 2024-10-13 NOTE — Progress Notes (Signed)
 Established Patient Office Visit  Subjective    Patient ID: Fred Harrison, male    DOB: [birth year]  Age: 58 y.o. MRN: [id number]  CC:  Chief Complaint  Patient presents with   Medical Management of Chronic Issues    Pt reports needing adjustments to BP medications     HPI Fred Harrison presents for follow up of hypertension. Patient reports elevated readings outside of the office as well. Patient denies acute symptoms.   Outpatient Encounter Medications as of 10/13/2024  Medication Sig   amLODipine  (NORVASC ) 5 MG tablet Take 1 tablet (5 mg total) by mouth daily.   aspirin  EC 81 MG tablet Take 81 mg by mouth in the morning. Swallow whole.   atorvastatin  (LIPITOR) 20 MG tablet TAKE 1 TABLET (20 MG TOTAL) BY MOUTH DAILY.   fluconazole (DIFLUCAN) 200 MG tablet Take 400 mg by mouth.   irbesartan (AVAPRO) 75 MG tablet Take 1 tablet (75 mg total) by mouth daily. (Patient taking differently: Take 75 mg by mouth daily. Patient is taking twice a day per Nephrologist's instructions)   labetalol  (NORMODYNE ) 200 MG tablet Take 200 mg by mouth 2 (two) times daily.   LOKELMA  10 g PACK packet Take 1 packet by mouth daily. (Patient taking differently: Take 1 packet by mouth daily. Patient taking 3 times a week - M W F - per Nephrologist instructions)   magnesium oxide (MAG-OX) 400 (240 Mg) MG tablet Take 1 tablet by mouth 2 (two) times daily.   pantoprazole (PROTONIX) 40 MG tablet Take 40 mg by mouth daily.   mycophenolate (MYFORTIC) 180 MG EC tablet Take 180 mg by mouth daily. (Patient not taking: Reported on 04/26/2024)   tacrolimus  (PROGRAF ) 1 MG capsule Take 4 mg by mouth 2 (two) times daily. (Patient not taking: Reported on 04/26/2024)   traMADol  (ULTRAM ) 50 MG tablet Take 1 tablet (50 mg total) by mouth every 8 (eight) hours as needed for up to 7 days for Moderate pain (4-6). (Patient not taking: Reported on 11/05/2021)   valGANciclovir (VALCYTE) 450 MG tablet Take 450 mg by mouth daily.  (Patient not taking: Reported on 01/08/2023)   Vitamin D, Ergocalciferol, (DRISDOL) 1.25 MG (50000 UNIT) CAPS capsule Take 50,000 Units by mouth every 7 (seven) days. (Patient not taking: Reported on 09/01/2024)   [DISCONTINUED] iron polysaccharides (NIFEREX) 150 MG capsule Take 150 mg by mouth daily. (Patient not taking: Reported on 04/26/2024)   [DISCONTINUED] isosorbide  mononitrate (IMDUR ) 30 MG 24 hr tablet TAKE 1 TABLET (30 MG TOTAL) BY MOUTH DAILY.   [DISCONTINUED] predniSONE (DELTASONE) 5 MG tablet Take 5 mg by mouth 2 (two) times daily with a meal. (Patient not taking: Reported on 09/01/2024)   [DISCONTINUED] sulfamethoxazole-trimethoprim (BACTRIM) 400-80 MG tablet Take 1 tablet by mouth 3 (three) times a week. Mondays, Wednesdays, Fridays (Patient not taking: Reported on 04/26/2024)   No facility-administered encounter medications on file as of 10/13/2024.    Past Medical History:  Diagnosis Date   Chronic kidney disease M-W-F   hemodialysis    Hypertension    TIA (transient ischemic attack) 2013,2015    Past Surgical History:  Procedure Laterality Date   A/V FISTULAGRAM N/A 12/13/2019   Procedure: A/V FISTULAGRAM - Left Arm;  Surgeon: Serene Gaile ORN, MD;  Location: [facility];  Service: Cardiovascular;  Laterality: N/A;   AV FISTULA PLACEMENT Left 09/16/2019   Procedure: ARTERIOVENOUS (AV) FISTULA CREATION;  Surgeon: Harvey Carlin BRAVO, MD;  Location: [facility] OR;  Service: Vascular;  Laterality:  Left;   COLONOSCOPY WITH PROPOFOL  N/A 01/20/2022   Procedure: COLONOSCOPY WITH PROPOFOL ;  Surgeon: Wilhelmenia Aloha Raddle., MD;  Location: [facility] ENDOSCOPY;  Service: Gastroenterology;  Laterality: N/A;   CYST REMOVED     FROM HEAD   IR FLUORO GUIDE CV LINE RIGHT  09/14/2019   IR US  GUIDE VASC ACCESS RIGHT  09/14/2019   KIDNEY TRANSPLANT Right    LIGATION OF COMPETING BRANCHES OF ARTERIOVENOUS FISTULA Left 12/13/2019   Procedure: BRANCH LIGATION OF LEFT ARTERIOVENOUS FISTULA;  Surgeon:  Eliza Lonni RAMAN, MD;  Location: [facility] OR;  Service: Vascular;  Laterality: Left;    Family History  Problem Relation Age of Onset   Stroke Mother    Hypertension Mother    Heart attack Father    Cancer Sister    Colon cancer Neg Hx    Colon polyps Neg Hx    Esophageal cancer Neg Hx    Stomach cancer Neg Hx    Rectal cancer Neg Hx     Social History   Socioeconomic History   Marital status: Divorced    Spouse name: Not on file   Number of children: Not on file   Years of education: Not on file   Highest education level: 12th grade  Occupational History   Not on file  Tobacco Use   Smoking status: Former    Current packs/day: 0.00    Types: Cigarettes    Quit date: 07/02/2016    Years since quitting: 8.2   Smokeless tobacco: Never  Vaping Use   Vaping status: Never Used  Substance and Sexual Activity   Alcohol use: No   Drug use: No   Sexual activity: Not on file  Other Topics Concern   Not on file  Social History Narrative   Not on file   Social Drivers of Health   Financial Resource Strain: Low Risk  (10/10/2024)   Overall Financial Resource Strain (CARDIA)    Difficulty of Paying Living Expenses: Not hard at all  Food Insecurity: No Food Insecurity (10/13/2024)   Hunger Vital Sign    Worried About Running Out of Food in the Last Year: Never true    Ran Out of Food in the Last Year: Never true  Transportation Needs: No Transportation Needs (10/13/2024)   PRAPARE - Administrator, Civil Service (Medical): No    Lack of Transportation (Non-Medical): No  Physical Activity: Sufficiently Active (10/10/2024)   Exercise Vital Sign    Days of Exercise per Week: 7 days    Minutes of Exercise per Session: 60 min  Stress: No Stress Concern Present (10/10/2024)   [employer] of Occupational Health - Occupational Stress Questionnaire    Feeling of Stress: Not at all  Social Connections: Socially Isolated (10/10/2024)   Social Connection and  Isolation Panel    Frequency of Communication with Friends and Family: More than three times a week    Frequency of Social Gatherings with Friends and Family: Patient declined    Attends Religious Services: Patient declined    Database Administrator or Organizations: No    Attends Engineer, Structural: Not on file    Marital Status: Divorced  Intimate Partner Violence: Not At Risk (10/13/2024)   Humiliation, Afraid, Rape, and Kick questionnaire    Fear of Current or Ex-Partner: No    Emotionally Abused: No    Physically Abused: No    Sexually Abused: No    Review of Systems  All other systems  reviewed and are negative.       Objective    BP (!) 175/96   Pulse 73   Ht 5' 6.75 (1.695 m)   Wt 222 lb (100.7 kg)   SpO2 96%   BMI 35.03 kg/m   Physical Exam Vitals and nursing note reviewed.  Constitutional:      General: He is not in acute distress. Cardiovascular:     Rate and Rhythm: Normal rate and regular rhythm.  Pulmonary:     Effort: Pulmonary effort is normal.     Breath sounds: Normal breath sounds.  Abdominal:     Palpations: Abdomen is soft.     Tenderness: There is no abdominal tenderness.  Neurological:     General: No focal deficit present.     Mental Status: He is alert and oriented to person, place, and time.         Assessment & Plan:   Uncontrolled hypertension  H/O kidney transplant  Other orders -     amLODIPine  Besylate; Take 1 tablet (5 mg total) by mouth daily.  Dispense: 90 tablet; Refill: 0   Amlodipine  5 mg to be added to regimen.   No follow-ups on file.   Tanda Raguel SQUIBB, MD

## 2024-10-26 DIAGNOSIS — Z940 Kidney transplant status: Secondary | ICD-10-CM | POA: Diagnosis not present

## 2024-10-26 DIAGNOSIS — I129 Hypertensive chronic kidney disease with stage 1 through stage 4 chronic kidney disease, or unspecified chronic kidney disease: Secondary | ICD-10-CM | POA: Diagnosis not present

## 2024-11-16 ENCOUNTER — Other Ambulatory Visit: Payer: Self-pay | Admitting: Internal Medicine

## 2024-11-18 NOTE — Telephone Encounter (Signed)
 Pt appt scheduled for 2/12

## 2024-12-15 ENCOUNTER — Other Ambulatory Visit: Payer: Self-pay | Admitting: Family Medicine

## 2024-12-15 NOTE — Telephone Encounter (Signed)
 Copied from CRM [id number]. Topic: Clinical - Medication Refill >> Dec 15, 2024  9:58 AM Joesph B wrote: Medication: labetalol  (NORMODYNE ) 200 MG tablet [[id number]]  Has the patient contacted their pharmacy? Yes (Agent: If no, request that the patient contact the pharmacy for the refill. If patient does not wish to contact the pharmacy document the reason why and proceed with request.) (Agent: If yes, when and what did the pharmacy advise?)  This is the patient's preferred pharmacy:  [id number] [place], [place] - [address] AT [facility] OF [facility] & [address] [place] [address] Phone: [phone] Fax: [phone]  Is this the correct pharmacy for this prescription? Yes If no, delete pharmacy and type the correct one.   Has the prescription been filled recently? Yes  Is the patient out of the medication? Yes  Has the patient been seen for an appointment in the last year OR does the patient have an upcoming appointment? Yes  Can we respond through MyChart? Yes  Agent: Please be advised that Rx refills may take up to 3 business days. We ask that you follow-up with your pharmacy.

## 2024-12-15 NOTE — Telephone Encounter (Signed)
 Requested medication (s) are due for refill today: historical medication   Requested medication (s) are on the active medication list: yes   Last refill:  na   Future visit scheduled: yes 01/16/25  Notes to clinic:  historical medication . Do you want to order Rx?     Requested Prescriptions  Pending Prescriptions Disp Refills   labetalol  (NORMODYNE ) 200 MG tablet      Sig: Take 1 tablet (200 mg total) by mouth 2 (two) times daily.     Cardiovascular:  Beta Blockers Failed - 12/15/2024  2:31 PM      Failed - Last BP in normal range    BP Readings from Last 1 Encounters:  10/13/24 (!) 175/96         Passed - Last Heart Rate in normal range    Pulse Readings from Last 1 Encounters:  10/13/24 73         Passed - Valid encounter within last 6 months    Recent Outpatient Visits           2 months ago Uncontrolled hypertension   [place] [facility], MD   3 months ago Essential hypertension   [place] [facility], MD   6 months ago Essential hypertension   [place] [facility], MD   3 years ago Essential hypertension   [place] [facility] [place] [facility] Dept Of [place]. [facility] Barn[facility] NOVAK, MD   3 years ago Essential hypertension   [place] [facility] - [facility] Dept Of [place]. [facility] Barnie NOVAK, MD

## 2024-12-20 ENCOUNTER — Other Ambulatory Visit: Payer: Self-pay | Admitting: Family Medicine

## 2024-12-20 MED ORDER — LABETALOL HCL 200 MG PO TABS: 200.0000 mg | ORAL_TABLET | Freq: Two times a day (BID) | ORAL | 0 refills | Status: AC

## 2024-12-20 NOTE — Telephone Encounter (Signed)
 Copied from CRM [id number]. Topic: Clinical - Medication Refill >> Dec 15, 2024  9:58 AM Joesph B wrote: Medication: labetalol  (NORMODYNE ) 200 MG tablet [[id number]]  Has the patient contacted their pharmacy? Yes (Agent: If no, request that the patient contact the pharmacy for the refill. If patient does not wish to contact the pharmacy document the reason why and proceed with request.) (Agent: If yes, when and what did the pharmacy advise?)  This is the patient's preferred pharmacy:  [facility] #[id number] [place], [place] - [address] AT [facility] OF [facility] & [address] [place] [address] Phone: [phone] Fax: [phone]  Is this the correct pharmacy for this prescription? Yes If no, delete pharmacy and type the correct one.   Has the prescription been filled recently? Yes  Is the patient out of the medication? Yes  Has the patient been seen for an appointment in the last year OR does the patient have an upcoming appointment? Yes  Can we respond through MyChart? Yes  Agent: Please be advised that Rx refills may take up to 3 business days. We ask that you follow-up with your pharmacy. >> Dec 20, 2024 11:19 AM Donna BRAVO wrote: Patient calling asking for refill update. This medication was original prescribed [facility] hospital due to kidney transplant. Patient nephrologist stated to have primary care provider refill medication. Patient would like enough medication to hold over until appt on 01/16/25  Patient has 1 day left.  [id number] [place], [place] - [address] AT [facility] OF [facility] & [address] [place] [address] Phone: [phone] Fax: [phone]

## 2024-12-20 NOTE — Telephone Encounter (Signed)
 Requested medication (s) are due for refill today: review  Requested medication (s) are on the active medication list: yes  Last refill:  06/02/24  Future visit scheduled: yes  Notes to clinic:  Historical provider/medication, please review for refill       Requested Prescriptions  Pending Prescriptions Disp Refills   labetalol  (NORMODYNE ) 200 MG tablet      Sig: Take 1 tablet (200 mg total) by mouth 2 (two) times daily.     Cardiovascular:  Beta Blockers Failed - 12/20/2024 12:17 PM      Failed - Last BP in normal range    BP Readings from Last 1 Encounters:  10/13/24 (!) 175/96         Passed - Last Heart Rate in normal range    Pulse Readings from Last 1 Encounters:  10/13/24 73         Passed - Valid encounter within last 6 months    Recent Outpatient Visits           2 months ago Uncontrolled hypertension   [place] [facility], MD   3 months ago Essential hypertension   [place] [facility], MD   6 months ago Essential hypertension   [place] [facility], MD   3 years ago Essential hypertension   [place] [facility] [place] Of [place]. [facility] Barnie NOVAK, MD   3 years ago Essential hypertension    [facility] [place] - A Dept Of . [facility] Vicci Barnie NOVAK, MD

## 2024-12-20 NOTE — Telephone Encounter (Signed)
 Requested medication (s) are due for refill today - unsure  Requested medication (s) are on the active medication list -yes  Future visit scheduled -yes  Last refill: unsure  Notes to clinic: listed as historical provider- sent for review   Requested Prescriptions  Pending Prescriptions Disp Refills   labetalol  (NORMODYNE ) 200 MG tablet      Sig: Take 1 tablet (200 mg total) by mouth 2 (two) times daily.     Cardiovascular:  Beta Blockers Failed - 12/20/2024 11:33 AM      Failed - Last BP in normal range    BP Readings from Last 1 Encounters:  10/13/24 (!) 175/96         Passed - Last Heart Rate in normal range    Pulse Readings from Last 1 Encounters:  10/13/24 73         Passed - Valid encounter within last 6 months    Recent Outpatient Visits           2 months ago Uncontrolled hypertension   [place] [facility], MD   3 months ago Essential hypertension   [place] [facility], MD   6 months ago Essential hypertension   [place] [facility], MD   3 years ago Essential hypertension   [place] [facility] [place] [facility] Dept Of [place]. [facility] Barnie NOVAK, MD   3 years ago Essential hypertension   [place] [facility] [place] - A Dept Of [place]. [facility] Barnie NOVAK, MD                 Requested Prescriptions  Pending Prescriptions Disp Refills   labetalol  (NORMODYNE ) 200 MG tablet      Sig: Take 1 tablet (200 mg total) by mouth 2 (two) times daily.     Cardiovascular:  Beta Blockers Failed - 12/20/2024 11:33 AM      Failed - Last BP in normal range    BP Readings from Last 1 Encounters:  10/13/24 (!) 175/96         Passed - Last Heart Rate in normal range    Pulse Readings from Last 1 Encounters:  10/13/24 73         Passed - Valid encounter within last 6 months    Recent Outpatient Visits            2 months ago Uncontrolled hypertension   [place] [facility], MD   3 months ago Essential hypertension   [place] [facility], MD   6 months ago Essential hypertension   [place] [facility], MD   3 years ago Essential hypertension   [place] [facility] [place] - A Dept Of [place]. [facility] Vic[facility] Barnie NOVAK, MD   3 years ago Essential hypertension    [facility] [place] - A Dept Of [place]. [facility] Vic[facility] Barnie NOVAK, MD

## 2025-01-05 ENCOUNTER — Ambulatory Visit: Payer: Self-pay | Admitting: Family Medicine

## 2025-01-16 ENCOUNTER — Ambulatory Visit: Admitting: Family Medicine
